# Patient Record
Sex: Female | Born: 1960 | Race: Black or African American | Hispanic: No | Smoking: Never smoker
Health system: Southern US, Community
[De-identification: ages and names within clinical notes are randomized; demographics above are authoritative.]

## PROBLEM LIST (undated history)

## (undated) DIAGNOSIS — D649 Anemia, unspecified: Secondary | ICD-10-CM

## (undated) DIAGNOSIS — I10 Essential (primary) hypertension: Secondary | ICD-10-CM

## (undated) DIAGNOSIS — R51 Headache: Secondary | ICD-10-CM

## (undated) DIAGNOSIS — Z90710 Acquired absence of both cervix and uterus: Secondary | ICD-10-CM

## (undated) DIAGNOSIS — Z9221 Personal history of antineoplastic chemotherapy: Secondary | ICD-10-CM

## (undated) DIAGNOSIS — K219 Gastro-esophageal reflux disease without esophagitis: Secondary | ICD-10-CM

## (undated) DIAGNOSIS — I639 Cerebral infarction, unspecified: Secondary | ICD-10-CM

## (undated) DIAGNOSIS — Z923 Personal history of irradiation: Secondary | ICD-10-CM

## (undated) DIAGNOSIS — Z8042 Family history of malignant neoplasm of prostate: Secondary | ICD-10-CM

## (undated) HISTORY — PX: SHOULDER ARTHROSCOPY: SHX128

## (undated) HISTORY — PX: KIDNEY STONE SURGERY: SHX686

## (undated) HISTORY — PX: LAPAROTOMY: SHX154

## (undated) HISTORY — DX: Family history of malignant neoplasm of prostate: Z80.42

## (undated) HISTORY — DX: Essential (primary) hypertension: I10

## (undated) SURGERY — ECHOCARDIOGRAM, TRANSESOPHAGEAL
Anesthesia: Moderate Sedation

---

## 1999-05-19 ENCOUNTER — Other Ambulatory Visit: Admission: RE | Admit: 1999-05-19 | Discharge: 1999-05-19 | Payer: Self-pay | Admitting: Obstetrics and Gynecology

## 2000-08-07 ENCOUNTER — Other Ambulatory Visit: Admission: RE | Admit: 2000-08-07 | Discharge: 2000-08-07 | Payer: Self-pay | Admitting: Obstetrics and Gynecology

## 2001-11-19 ENCOUNTER — Other Ambulatory Visit: Admission: RE | Admit: 2001-11-19 | Discharge: 2001-11-19 | Payer: Self-pay | Admitting: Obstetrics and Gynecology

## 2003-02-23 ENCOUNTER — Other Ambulatory Visit: Admission: RE | Admit: 2003-02-23 | Discharge: 2003-02-23 | Payer: Self-pay | Admitting: Obstetrics and Gynecology

## 2003-02-26 ENCOUNTER — Encounter: Admission: RE | Admit: 2003-02-26 | Discharge: 2003-02-26 | Payer: Self-pay | Admitting: Obstetrics and Gynecology

## 2004-03-13 ENCOUNTER — Encounter: Admission: RE | Admit: 2004-03-13 | Discharge: 2004-03-13 | Payer: Self-pay | Admitting: Obstetrics and Gynecology

## 2005-04-25 ENCOUNTER — Ambulatory Visit (HOSPITAL_COMMUNITY): Admission: RE | Admit: 2005-04-25 | Discharge: 2005-04-25 | Payer: Self-pay | Admitting: Family Medicine

## 2005-05-10 ENCOUNTER — Ambulatory Visit (HOSPITAL_COMMUNITY): Admission: RE | Admit: 2005-05-10 | Discharge: 2005-05-10 | Payer: Self-pay | Admitting: Urology

## 2005-05-28 ENCOUNTER — Encounter: Admission: RE | Admit: 2005-05-28 | Discharge: 2005-05-28 | Payer: Self-pay | Admitting: Obstetrics and Gynecology

## 2006-01-18 ENCOUNTER — Encounter (INDEPENDENT_AMBULATORY_CARE_PROVIDER_SITE_OTHER): Payer: Self-pay | Admitting: Specialist

## 2006-01-18 ENCOUNTER — Ambulatory Visit (HOSPITAL_COMMUNITY): Admission: RE | Admit: 2006-01-18 | Discharge: 2006-01-18 | Payer: Self-pay | Admitting: Obstetrics and Gynecology

## 2006-06-05 ENCOUNTER — Encounter: Admission: RE | Admit: 2006-06-05 | Discharge: 2006-06-05 | Payer: Self-pay | Admitting: Obstetrics and Gynecology

## 2007-06-10 ENCOUNTER — Encounter: Admission: RE | Admit: 2007-06-10 | Discharge: 2007-06-10 | Payer: Self-pay | Admitting: Obstetrics and Gynecology

## 2008-07-06 ENCOUNTER — Encounter: Admission: RE | Admit: 2008-07-06 | Discharge: 2008-07-06 | Payer: Self-pay | Admitting: Obstetrics and Gynecology

## 2009-07-19 ENCOUNTER — Encounter: Admission: RE | Admit: 2009-07-19 | Discharge: 2009-07-19 | Payer: Self-pay | Admitting: Obstetrics and Gynecology

## 2010-08-03 ENCOUNTER — Other Ambulatory Visit: Payer: Self-pay | Admitting: Obstetrics and Gynecology

## 2010-08-03 DIAGNOSIS — Z1231 Encounter for screening mammogram for malignant neoplasm of breast: Secondary | ICD-10-CM

## 2010-08-29 ENCOUNTER — Ambulatory Visit
Admission: RE | Admit: 2010-08-29 | Discharge: 2010-08-29 | Disposition: A | Discharge status: Home / Self Care | Payer: BC Managed Care – PPO | Source: Ambulatory Visit | Attending: Obstetrics and Gynecology | Admitting: Obstetrics and Gynecology

## 2010-08-29 DIAGNOSIS — Z1231 Encounter for screening mammogram for malignant neoplasm of breast: Secondary | ICD-10-CM

## 2010-09-15 NOTE — Op Note (Signed)
NAMEMILISSA, Katrina Liu               ACCOUNT NO.:  [id number]   MEDICAL RECORD NO.:  [id number]          PATIENT TYPE:  AMB   LOCATION:  SDC                           FACILITY:  [facility]   PHYSICIAN:  Maxie Better, M.D.DATE OF BIRTH:  [birth year]   DATE OF PROCEDURE:  01/18/2006  DATE OF DISCHARGE:  01/18/2006                                 OPERATIVE REPORT   PREOPERATIVE DIAGNOSES:  1. Moderate cervical dysplasia.  2. Shortened cervix.  3. Retroverted fibroid uterus   POSTOPERATIVE DIAGNOSES:  1. Moderate cervical dysplasia, pending final pathology.  2. Shortened cervix.  3. Retroverted Fibroid uterus.   PROCEDURE:  Colposcopic directed LEEP procedure.   ANESTHESIA:  General.   SURGEON:  Maxie Better, M.D.   ASSISTANT:  Pershing Cox, M.D.   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  The patient, however, had a significantly shortened cervix due to  her previous procedure.  The cervix was then further compromised by her  enlarged fibroid uterus, which was retroverted -- that pulled the cervix  anteriorly.  Decision was therefore made to perform her procedure under  operative setting.  The risks and benefits of the procedure had been  explained to the patient, including the possibility of not being able to  perform the procedure.  Consent was signed.  The patient was transferred to  the operating room.   PROCEDURE:  Under adequate monitored general anesthesia, the patient was  placed in the dorsal lithotomy position.  She was sterilely prepped and  draped in the usual fashion. The bladder was catheterized of a small amount  of urine.   Initially a bivalved speculum was placed in the vagina; however, the cervix  was very anterior and the bivalved speculum was then placed by a weighted  speculum.  The posterior lip of the cervix was much  more prominent than the  anterior lip.   Colposcopy was then performed.  Some aceto white abnormality was noted  posteriorly.  Then manipulation of the vaginal walls, particularly the side  walls, it was then felt that stay sutures at the 3 and 9 o'clock positions  would help to facilitate her surgery.  The cervix was also helped to be  visualized with Dr. Carey Bullocks pressing on the lower abdomen, which resulted in  displacing the uterus inferiorly and medially -- which would then allow for  the cervix to be better visualized.   Stay sutures of 0 Vicryl figure-of-eight was placed laterally.  The cervix  was then circumferentially injected with 1% Xylocaine with epinephrine, and  Lugol solution had been placed.  LEEP was then done with a 15 mm loop  posterior and anterior lip, with care not to go too deeply.  The base was  then fulgurated and Monsell solution applied.  The stay sutures were just  cut or would otherwise remain in place.  All instruments then were removed  from the vagina.   SPECIMENS:  Specimens of the anterior  and posterior lip of the cervix sent  to pathology.   ESTIMATED BLOOD LOSS:  Minimal.   COMPLICATIONS:  None.   DISPOSITION:  The patient tolerated the procedure well; was transferred to  recovery room in stable condition.      Maxie Better, M.D.  Electronically Signed     [place]/MEDQ  D:  01/18/2006  T:  01/21/2006  Job:  [id number]

## 2010-09-15 NOTE — Op Note (Signed)
NAMEANTHONY, ROLAND NO.:  [id number]   MEDICAL RECORD NO.:  [id number]          PATIENT TYPE:  INP   LOCATION:  1610                         FACILITY:  [facility]   PHYSICIAN:  Valetta Fuller, M.D.  DATE OF BIRTH:  [birth year]   DATE OF PROCEDURE:  04/25/2005  DATE OF DISCHARGE:                                 OPERATIVE REPORT   PREOPERATIVE DIAGNOSIS:  Right proximal ureteral calculus.   POSTOPERATIVE DIAGNOSIS:  Right proximal ureteral calculus.   PROCEDURE PERFORMED:  Cystoscopy, right retrograde pyelography and double-J  stent placement.   SURGEON:  Valetta Fuller, MD.   ANESTHESIA:  General.   INDICATIONS:  Ms. Jaquita Rector is a 50 year old female with no previous history  of nephrolithiasis. She presented approximately 12 hours ago with acute  onset of right flank pain. A CT scan was performed which showed an  obstructed right renal unit from a 5 mm proximal ureteral stone. The patient  had no evidence of infection but was having ongoing discomfort. We discussed  options with her. She understood there was approximately a 50/50 chance of  spontaneous passage. She preferred surgical intervention. Given the proximal  location of the stone, we felt the prudent thing to do this evening was just  to place a double-J stent and then either perform elective lithotripsy or  ureteroscopy at a later date. She appear to understand the advantages and  disadvantages of this approach.   TECHNIQUE AND FINDINGS:  The patient was brought to the operating room where  she had successful induction of general anesthesia. She was placed lithotomy  position, prepped and draped in the usual manner. Cystoscopy was performed  and was unremarkable. Attention was first towards retrograde pyelography.  This was done with an 8 [place] cone-tip catheter. The distal ureter was  normal caliber. In the proximal ureter  several centimeters beneath the  ureteropelvic junction, there was a  high-grade obstruction with a very  dilated proximal ureter and collecting system. There was a filling defect  suggesting a 8 mm stone.  Attention was then turned to stent placement. The guidewire was able to be  passed beyond the stone. We then used a 5-French Polaris stent which was 24  cm in length. This was placed with fluoroscopic as well as direct visual  guidance. The patient appeared to tolerate the procedure well. There were no  obvious complications and she was brought to the recovery room in stable  condition.           ______________________________  Valetta Fuller, M.D.  Electronically Signed     [facility]/MEDQ  D:  04/25/2005  T:  04/26/2005  Job:  [id number]

## 2011-04-06 ENCOUNTER — Encounter (HOSPITAL_COMMUNITY): Payer: Self-pay | Admitting: Pharmacist

## 2011-04-09 NOTE — Patient Instructions (Signed)
   Your procedure is scheduled on: Monday, Dec 17TH  Enter through the [employer] [facility] at: 11:30am Pick up the phone at the desk and dial [phone] and inform us of your arrival.  Please call this number if you have any problems the morning of surgery: [phone]  Remember: Do not eat food after midnight: Sunday Do not drink clear liquids after: 9am Monday Take these medicines the morning of surgery with a SIP OF WATER:  Do not wear jewelry, make-up, or FINGER nail polish Do not wear lotions, powders, perfumes or deodorant. Do not shave 48 hours prior to surgery. Do not bring valuables to the hospital.  Leave suitcase in the car. After Surgery it may be brought to your room. For patients being admitted to the hospital, checkout time is 11:00am the day of discharge.  Patients discharged on the day of surgery will not be allowed to drive home.     Remember to use your hibiclens as instructed.Please shower with 1/2 bottle the evening before your surgery and the other 1/2 bottle the morning of surgery.

## 2011-04-10 ENCOUNTER — Other Ambulatory Visit: Payer: Self-pay | Admitting: Obstetrics and Gynecology

## 2011-04-10 MED ORDER — ACETAMINOPHEN 10 MG/ML IV SOLN: 1000.0000 mg | Freq: Four times a day (QID) | INTRAVENOUS | Status: AC

## 2011-04-11 ENCOUNTER — Encounter (HOSPITAL_COMMUNITY)
Admission: RE | Admit: 2011-04-11 | Discharge: 2011-04-11 | Disposition: A | Discharge status: Home / Self Care | Payer: BC Managed Care – PPO | Source: Ambulatory Visit | Attending: Obstetrics and Gynecology | Admitting: Obstetrics and Gynecology

## 2011-04-11 ENCOUNTER — Encounter (HOSPITAL_COMMUNITY): Payer: Self-pay

## 2011-04-11 HISTORY — DX: Headache: R51

## 2011-04-11 HISTORY — DX: Gastro-esophageal reflux disease without esophagitis: K21.9

## 2011-04-11 HISTORY — DX: Anemia, unspecified: D64.9

## 2011-04-11 LAB — CBC
HCT: 39.5 % (ref 36.0–46.0)
Hemoglobin: 13.4 g/dL (ref 12.0–15.0)
MCH: 30.7 pg (ref 26.0–34.0)
MCHC: 33.9 g/dL (ref 30.0–36.0)
MCV: 90.6 fL (ref 78.0–100.0)
Platelets: 208 10*3/uL (ref 150–400)
RBC: 4.36 MIL/uL (ref 3.87–5.11)
RDW: 13.4 % (ref 11.5–15.5)
WBC: 4.4 10*3/uL (ref 4.0–10.5)

## 2011-04-11 LAB — BASIC METABOLIC PANEL
CO2: 23 mEq/L (ref 19–32)
Calcium: 9.5 mg/dL (ref 8.4–10.5)
Creatinine, Ser: 0.73 mg/dL (ref 0.50–1.10)
GFR calc non Af Amer: >90 mL/min (ref >90)
Glucose, Bld: 101 mg/dL — ABNORMAL HIGH (ref 70–99)

## 2011-04-11 LAB — SURGICAL PCR SCREEN
MRSA, PCR: NEGATIVE
Staphylococcus aureus: NEGATIVE

## 2011-04-15 MED ORDER — CEFAZOLIN SODIUM-DEXTROSE 2-3 GM-% IV SOLR
2.0000 g | INTRAVENOUS | Status: AC
  Administered 2011-04-16: 2 g via INTRAVENOUS
  Filled 2011-04-15: qty 50

## 2011-04-16 ENCOUNTER — Encounter (HOSPITAL_COMMUNITY): Payer: Self-pay | Admitting: *Deleted

## 2011-04-16 ENCOUNTER — Encounter (HOSPITAL_COMMUNITY): Payer: Self-pay | Admitting: Anesthesiology

## 2011-04-16 ENCOUNTER — Encounter (HOSPITAL_COMMUNITY)
Admission: RE | Disposition: A | Discharge status: Home / Self Care | Payer: Self-pay | Source: Ambulatory Visit | Attending: Obstetrics and Gynecology

## 2011-04-16 ENCOUNTER — Other Ambulatory Visit: Payer: Self-pay | Admitting: Obstetrics and Gynecology

## 2011-04-16 ENCOUNTER — Inpatient Hospital Stay (HOSPITAL_COMMUNITY)
Admission: RE | Admit: 2011-04-16 | Discharge: 2011-04-18 | DRG: 359 | Disposition: A | Discharge status: Home / Self Care | Payer: BC Managed Care – PPO | Source: Ambulatory Visit | Attending: Obstetrics and Gynecology | Admitting: Obstetrics and Gynecology

## 2011-04-16 ENCOUNTER — Ambulatory Visit (HOSPITAL_COMMUNITY): Payer: BC Managed Care – PPO | Admitting: Anesthesiology

## 2011-04-16 DIAGNOSIS — D250 Submucous leiomyoma of uterus: Secondary | ICD-10-CM | POA: Diagnosis present

## 2011-04-16 DIAGNOSIS — N920 Excessive and frequent menstruation with regular cycle: Principal | ICD-10-CM | POA: Diagnosis present

## 2011-04-16 DIAGNOSIS — D252 Subserosal leiomyoma of uterus: Secondary | ICD-10-CM | POA: Diagnosis present

## 2011-04-16 DIAGNOSIS — N949 Unspecified condition associated with female genital organs and menstrual cycle: Secondary | ICD-10-CM | POA: Diagnosis present

## 2011-04-16 DIAGNOSIS — Z90710 Acquired absence of both cervix and uterus: Secondary | ICD-10-CM | POA: Diagnosis not present

## 2011-04-16 DIAGNOSIS — Z5331 Laparoscopic surgical procedure converted to open procedure: Secondary | ICD-10-CM

## 2011-04-16 DIAGNOSIS — N946 Dysmenorrhea, unspecified: Secondary | ICD-10-CM | POA: Diagnosis present

## 2011-04-16 DIAGNOSIS — D251 Intramural leiomyoma of uterus: Secondary | ICD-10-CM | POA: Diagnosis present

## 2011-04-16 DIAGNOSIS — D649 Anemia, unspecified: Secondary | ICD-10-CM | POA: Diagnosis present

## 2011-04-16 DIAGNOSIS — N8000 Endometriosis of the uterus, unspecified: Secondary | ICD-10-CM | POA: Diagnosis present

## 2011-04-16 HISTORY — DX: Acquired absence of both cervix and uterus: Z90.710

## 2011-04-16 HISTORY — PX: ABDOMINAL HYSTERECTOMY: SHX81

## 2011-04-16 SURGERY — SALPINGECTOMY, BILATERAL, OPEN
Anesthesia: General | Site: Uterus | Wound class: Clean Contaminated

## 2011-04-16 MED ORDER — NEOSTIGMINE METHYLSULFATE 1 MG/ML IJ SOLN
INTRAMUSCULAR | Status: DC | PRN
  Administered 2011-04-16: 2 mg via INTRAVENOUS

## 2011-04-16 MED ORDER — ONDANSETRON HCL 4 MG/2ML IJ SOLN: 4.0000 mg | Freq: Four times a day (QID) | INTRAMUSCULAR | Status: DC | PRN

## 2011-04-16 MED ORDER — LACTATED RINGERS IR SOLN
Status: DC | PRN
  Administered 2011-04-16: 3000 mL

## 2011-04-16 MED ORDER — DEXAMETHASONE SODIUM PHOSPHATE 10 MG/ML IJ SOLN
INTRAMUSCULAR | Status: AC
  Filled 2011-04-16: qty 1

## 2011-04-16 MED ORDER — HYDROMORPHONE 0.3 MG/ML IV SOLN
INTRAVENOUS | Status: DC
  Administered 2011-04-16: 0.6 mg via INTRAVENOUS
  Administered 2011-04-16: 20:00:00 via INTRAVENOUS
  Administered 2011-04-17 (×2): 0.3 mg via INTRAVENOUS
  Filled 2011-04-16: qty 25

## 2011-04-16 MED ORDER — ROCURONIUM BROMIDE 50 MG/5ML IV SOLN
INTRAVENOUS | Status: AC
  Filled 2011-04-16: qty 1

## 2011-04-16 MED ORDER — FENTANYL CITRATE 0.05 MG/ML IJ SOLN
INTRAMUSCULAR | Status: AC
  Administered 2011-04-16: 50 ug via INTRAVENOUS
  Filled 2011-04-16: qty 2

## 2011-04-16 MED ORDER — FENTANYL CITRATE 0.05 MG/ML IJ SOLN
INTRAMUSCULAR | Status: AC
  Filled 2011-04-16: qty 2

## 2011-04-16 MED ORDER — OXYCODONE-ACETAMINOPHEN 5-325 MG PO TABS
1.0000 | ORAL_TABLET | ORAL | Status: DC | PRN
  Administered 2011-04-17 – 2011-04-18 (×3): 1 via ORAL
  Filled 2011-04-16 (×3): qty 1

## 2011-04-16 MED ORDER — SUFENTANIL CITRATE 50 MCG/ML IV SOLN
INTRAVENOUS | Status: AC
  Filled 2011-04-16: qty 1

## 2011-04-16 MED ORDER — PROPOFOL 10 MG/ML IV EMUL
INTRAVENOUS | Status: DC | PRN
  Administered 2011-04-16: 150 mg via INTRAVENOUS
  Administered 2011-04-16 (×2): 20 mg via INTRAVENOUS
  Administered 2011-04-16 (×2): 50 mg via INTRAVENOUS
  Administered 2011-04-16: 20 mg via INTRAVENOUS

## 2011-04-16 MED ORDER — DIPHENHYDRAMINE HCL 50 MG/ML IJ SOLN: 12.5000 mg | Freq: Four times a day (QID) | INTRAMUSCULAR | Status: DC | PRN

## 2011-04-16 MED ORDER — GLYCOPYRROLATE 0.2 MG/ML IJ SOLN
INTRAMUSCULAR | Status: DC | PRN
  Administered 2011-04-16: .4 mg via INTRAVENOUS

## 2011-04-16 MED ORDER — HYDROMORPHONE HCL PF 1 MG/ML IJ SOLN
0.2000 mg | INTRAMUSCULAR | Status: DC | PRN
Start: 2011-04-16 — End: 2011-04-18

## 2011-04-16 MED ORDER — ARTIFICIAL TEARS OP OINT
TOPICAL_OINTMENT | OPHTHALMIC | Status: DC | PRN
  Administered 2011-04-16: 1 via OPHTHALMIC

## 2011-04-16 MED ORDER — ONDANSETRON HCL 4 MG/2ML IJ SOLN
INTRAMUSCULAR | Status: AC
  Filled 2011-04-16: qty 2

## 2011-04-16 MED ORDER — ONDANSETRON HCL 4 MG PO TABS: 4.0000 mg | ORAL_TABLET | Freq: Four times a day (QID) | ORAL | Status: DC | PRN

## 2011-04-16 MED ORDER — KETOROLAC TROMETHAMINE 30 MG/ML IJ SOLN: 30.0000 mg | Freq: Four times a day (QID) | INTRAMUSCULAR | Status: DC

## 2011-04-16 MED ORDER — LIDOCAINE HCL (CARDIAC) 20 MG/ML IV SOLN
INTRAVENOUS | Status: AC
  Filled 2011-04-16: qty 5

## 2011-04-16 MED ORDER — ACETAMINOPHEN 10 MG/ML IV SOLN
1000.0000 mg | Freq: Once | INTRAVENOUS | Status: AC
  Administered 2011-04-16: 1000 mg via INTRAVENOUS
  Filled 2011-04-16: qty 100

## 2011-04-16 MED ORDER — PANTOPRAZOLE SODIUM 40 MG PO TBEC
40.0000 mg | DELAYED_RELEASE_TABLET | Freq: Every day | ORAL | Status: DC
  Administered 2011-04-16 – 2011-04-17 (×2): 40 mg via ORAL
  Filled 2011-04-16 (×3): qty 1

## 2011-04-16 MED ORDER — MENTHOL 3 MG MT LOZG: 1.0000 | LOZENGE | OROMUCOSAL | Status: DC | PRN

## 2011-04-16 MED ORDER — HETASTARCH-ELECTROLYTES 6 % IV SOLN
INTRAVENOUS | Status: DC | PRN
  Administered 2011-04-16: 15:00:00 via INTRAVENOUS

## 2011-04-16 MED ORDER — CEFAZOLIN SODIUM 1-5 GM-% IV SOLN
1.0000 g | Freq: Three times a day (TID) | INTRAVENOUS | Status: AC
Start: 2011-04-16 — End: 2011-04-17
  Administered 2011-04-16 – 2011-04-17 (×2): 1 g via INTRAVENOUS
  Filled 2011-04-16 (×2): qty 50

## 2011-04-16 MED ORDER — DIPHENHYDRAMINE HCL 12.5 MG/5ML PO ELIX: 12.5000 mg | ORAL_SOLUTION | Freq: Four times a day (QID) | ORAL | Status: DC | PRN

## 2011-04-16 MED ORDER — HYDROMORPHONE HCL PF 1 MG/ML IJ SOLN
0.5000 mg | INTRAMUSCULAR | Status: AC | PRN
  Administered 2011-04-16 (×4): 0.5 mg via INTRAVENOUS

## 2011-04-16 MED ORDER — NALOXONE HCL 0.4 MG/ML IJ SOLN: 0.4000 mg | INTRAMUSCULAR | Status: DC | PRN

## 2011-04-16 MED ORDER — SODIUM CHLORIDE 0.9 % IJ SOLN: 9.0000 mL | INTRAMUSCULAR | Status: DC | PRN

## 2011-04-16 MED ORDER — ZOLPIDEM TARTRATE 5 MG PO TABS: 5.0000 mg | ORAL_TABLET | Freq: Every evening | ORAL | Status: DC | PRN

## 2011-04-16 MED ORDER — DEXTROSE IN LACTATED RINGERS 5 % IV SOLN
INTRAVENOUS | Status: DC
  Administered 2011-04-16 – 2011-04-17 (×2): via INTRAVENOUS

## 2011-04-16 MED ORDER — HETASTARCH-ELECTROLYTES 6 % IV SOLN
INTRAVENOUS | Status: AC
  Filled 2011-04-16: qty 500

## 2011-04-16 MED ORDER — ROCURONIUM BROMIDE 100 MG/10ML IV SOLN
INTRAVENOUS | Status: DC | PRN
  Administered 2011-04-16: 10 mg via INTRAVENOUS
  Administered 2011-04-16: 50 mg via INTRAVENOUS
  Administered 2011-04-16: 10 mg via INTRAVENOUS

## 2011-04-16 MED ORDER — HYDROMORPHONE HCL PF 1 MG/ML IJ SOLN
INTRAMUSCULAR | Status: AC
  Administered 2011-04-16: 0.5 mg via INTRAVENOUS
  Filled 2011-04-16: qty 1

## 2011-04-16 MED ORDER — IBUPROFEN 800 MG PO TABS: 800.0000 mg | ORAL_TABLET | Freq: Three times a day (TID) | ORAL | Status: DC | PRN

## 2011-04-16 MED ORDER — FENTANYL CITRATE 0.05 MG/ML IJ SOLN
25.0000 ug | INTRAMUSCULAR | Status: DC | PRN
  Administered 2011-04-16 (×3): 50 ug via INTRAVENOUS

## 2011-04-16 MED ORDER — KETOROLAC TROMETHAMINE 30 MG/ML IJ SOLN
INTRAMUSCULAR | Status: AC
  Filled 2011-04-16: qty 1

## 2011-04-16 MED ORDER — LACTATED RINGERS IV SOLN
INTRAVENOUS | Status: DC
Start: 2011-04-16 — End: 2011-04-16
  Administered 2011-04-16 (×3): via INTRAVENOUS

## 2011-04-16 MED ORDER — SUFENTANIL CITRATE 50 MCG/ML IV SOLN
INTRAVENOUS | Status: DC | PRN
  Administered 2011-04-16: 10 ug via INTRAVENOUS
  Administered 2011-04-16: 15 ug via INTRAVENOUS
  Administered 2011-04-16: 10 ug via INTRAVENOUS
  Administered 2011-04-16: 5 ug via INTRAVENOUS
  Administered 2011-04-16: 10 ug via INTRAVENOUS

## 2011-04-16 MED ORDER — KETOROLAC TROMETHAMINE 30 MG/ML IJ SOLN
30.0000 mg | Freq: Four times a day (QID) | INTRAMUSCULAR | Status: DC
  Administered 2011-04-16 – 2011-04-18 (×6): 30 mg via INTRAVENOUS
  Filled 2011-04-16 (×6): qty 1

## 2011-04-16 MED ORDER — DEXAMETHASONE SODIUM PHOSPHATE 10 MG/ML IJ SOLN
INTRAMUSCULAR | Status: DC | PRN
  Administered 2011-04-16: 10 mg via INTRAVENOUS

## 2011-04-16 MED ORDER — MIDAZOLAM HCL 5 MG/5ML IJ SOLN
INTRAMUSCULAR | Status: DC | PRN
  Administered 2011-04-16 (×2): 1 mg via INTRAVENOUS

## 2011-04-16 MED ORDER — GLYCOPYRROLATE 0.2 MG/ML IJ SOLN
INTRAMUSCULAR | Status: AC
  Filled 2011-04-16: qty 2

## 2011-04-16 MED ORDER — KETOROLAC TROMETHAMINE 30 MG/ML IJ SOLN
INTRAMUSCULAR | Status: DC | PRN
  Administered 2011-04-16: 30 mg via INTRAVENOUS

## 2011-04-16 MED ORDER — BUPIVACAINE HCL (PF) 0.25 % IJ SOLN
INTRAMUSCULAR | Status: DC | PRN
  Administered 2011-04-16: 15 mL

## 2011-04-16 MED ORDER — ONDANSETRON HCL 4 MG/2ML IJ SOLN
INTRAMUSCULAR | Status: DC | PRN
  Administered 2011-04-16: 4 mg via INTRAVENOUS

## 2011-04-16 MED ORDER — LIDOCAINE HCL (CARDIAC) 20 MG/ML IV SOLN
INTRAVENOUS | Status: DC | PRN
  Administered 2011-04-16: 60 mg via INTRAVENOUS

## 2011-04-16 MED ORDER — NEOSTIGMINE METHYLSULFATE 1 MG/ML IJ SOLN
INTRAMUSCULAR | Status: AC
  Filled 2011-04-16: qty 10

## 2011-04-16 MED ORDER — PROPOFOL 10 MG/ML IV EMUL
INTRAVENOUS | Status: AC
  Filled 2011-04-16: qty 40

## 2011-04-16 MED ORDER — MIDAZOLAM HCL 2 MG/2ML IJ SOLN
INTRAMUSCULAR | Status: AC
  Filled 2011-04-16: qty 2

## 2011-04-16 MED ORDER — HYDROMORPHONE HCL PF 1 MG/ML IJ SOLN
INTRAMUSCULAR | Status: AC
  Filled 2011-04-16: qty 1

## 2011-04-16 SURGICAL SUPPLY — 71 items
BAG URINE DRAINAGE (UROLOGICAL SUPPLIES) ×3 IMPLANT
BARRIER ADHS 3X4 INTERCEED (GAUZE/BANDAGES/DRESSINGS) IMPLANT
BENZOIN TINCTURE PRP APPL 2/3 (GAUZE/BANDAGES/DRESSINGS) ×3 IMPLANT
BLADE SURG 10 STRL SS (BLADE) ×6 IMPLANT
CABLE HIGH FREQUENCY MONO STRZ (ELECTRODE) ×3 IMPLANT
CANISTER SUCTION 2500CC (MISCELLANEOUS) ×3 IMPLANT
CATH FOLEY 3WAY  5CC 16FR (CATHETERS) ×1
CATH FOLEY 3WAY 5CC 16FR (CATHETERS) ×2 IMPLANT
CHLORAPREP W/TINT 26ML (MISCELLANEOUS) ×3 IMPLANT
CLOTH BEACON ORANGE TIMEOUT ST (SAFETY) ×3 IMPLANT
CONT PATH 16OZ SNAP LID 3702 (MISCELLANEOUS) ×3 IMPLANT
COVER MAYO STAND STRL (DRAPES) ×3 IMPLANT
COVER TABLE BACK 60X90 (DRAPES) ×6 IMPLANT
COVER TIP SHEARS 8 DVNC (MISCELLANEOUS) ×2 IMPLANT
COVER TIP SHEARS 8MM DA VINCI (MISCELLANEOUS) ×1
DECANTER SPIKE VIAL GLASS SM (MISCELLANEOUS) ×3 IMPLANT
DERMABOND ADVANCED (GAUZE/BANDAGES/DRESSINGS) ×1
DERMABOND ADVANCED .7 DNX12 (GAUZE/BANDAGES/DRESSINGS) ×2 IMPLANT
DRAPE HUG U DISPOSABLE (DRAPE) ×3 IMPLANT
DRAPE LG THREE QUARTER DISP (DRAPES) ×6 IMPLANT
DRAPE WARM FLUID 44X44 (DRAPE) ×3 IMPLANT
DRESSING TELFA 8X3 (GAUZE/BANDAGES/DRESSINGS) ×3 IMPLANT
DRSG COVADERM 4X10 (GAUZE/BANDAGES/DRESSINGS) ×3 IMPLANT
ELECT LIGASURE LONG (ELECTRODE) ×3 IMPLANT
ELECT REM PT RETURN 9FT ADLT (ELECTROSURGICAL) ×3
ELECTRODE REM PT RTRN 9FT ADLT (ELECTROSURGICAL) ×2 IMPLANT
EVACUATOR SMOKE 8.L (FILTER) ×3 IMPLANT
GAUZE VASELINE 3X9 (GAUZE/BANDAGES/DRESSINGS) IMPLANT
GLOVE BIO SURGEON STRL SZ 6.5 (GLOVE) ×9 IMPLANT
GLOVE BIOGEL PI IND STRL 7.0 (GLOVE) ×12 IMPLANT
GLOVE BIOGEL PI INDICATOR 7.0 (GLOVE) ×6
GOWN PREVENTION PLUS LG XLONG (DISPOSABLE) ×6 IMPLANT
KIT ACCESSORY DA VINCI DISP (KITS) ×1
KIT ACCESSORY DVNC DISP (KITS) ×2 IMPLANT
NEEDLE HYPO 22GX1.5 SAFETY (NEEDLE) ×3 IMPLANT
NEEDLE HYPO 25X1 1.5 SAFETY (NEEDLE) ×3 IMPLANT
NEEDLE INSUFFLATION 14GA 120MM (NEEDLE) ×3 IMPLANT
NS IRRIG 1000ML POUR BTL (IV SOLUTION) ×9 IMPLANT
OCCLUDER COLPOPNEUMO (BALLOONS) ×3 IMPLANT
PACK LAVH (CUSTOM PROCEDURE TRAY) ×3 IMPLANT
PAD OB MATERNITY 4.3X12.25 (PERSONAL CARE ITEMS) ×3 IMPLANT
PAD PREP 24X48 CUFFED NSTRL (MISCELLANEOUS) ×6 IMPLANT
PLUG CATH AND CAP STER (CATHETERS) ×3 IMPLANT
SET IRRIG TUBING LAPAROSCOPIC (IRRIGATION / IRRIGATOR) ×3 IMPLANT
SPONGE LAP 18X18 X RAY DECT (DISPOSABLE) ×15 IMPLANT
STRIP CLOSURE SKIN 1/2X4 (GAUZE/BANDAGES/DRESSINGS) ×3 IMPLANT
SUT MON AB 4-0 PS1 27 (SUTURE) ×6 IMPLANT
SUT PLAIN 2 0 (SUTURE) ×1
SUT PLAIN ABS 2-0 CT1 27XMFL (SUTURE) ×2 IMPLANT
SUT VIC AB 0 CT1 18XCR BRD8 (SUTURE) ×4 IMPLANT
SUT VIC AB 0 CT1 27 (SUTURE) ×5
SUT VIC AB 0 CT1 27XBRD ANBCTR (SUTURE) ×4 IMPLANT
SUT VIC AB 0 CT1 27XBRD ANTBC (SUTURE) ×6 IMPLANT
SUT VIC AB 0 CT1 36 (SUTURE) ×6 IMPLANT
SUT VIC AB 0 CT1 8-18 (SUTURE) ×2
SUT VICRYL 0 TIES 12 18 (SUTURE) ×3 IMPLANT
SUT VICRYL 0 UR6 27IN ABS (SUTURE) ×6 IMPLANT
SUT VICRYL 4-0 PS2 18IN ABS (SUTURE) ×6 IMPLANT
SYR 50ML LL SCALE MARK (SYRINGE) ×3 IMPLANT
SYSTEM CONVERTIBLE TROCAR (TROCAR) ×3 IMPLANT
TIP UTERINE 6.7X10CM GRN DISP (MISCELLANEOUS) ×3 IMPLANT
TOWEL OR 17X24 6PK STRL BLUE (TOWEL DISPOSABLE) ×9 IMPLANT
TRAY FOLEY CATH 14FR (SET/KITS/TRAYS/PACK) ×3 IMPLANT
TROCAR 12M 150ML BLUNT (TROCAR) ×3 IMPLANT
TROCAR DISP BLADELESS 8 DVNC (TROCAR) ×2 IMPLANT
TROCAR DISP BLADELESS 8MM (TROCAR) ×1
TROCAR Z-THREAD 12X150 (TROCAR) ×3 IMPLANT
TUBING CONNECTING 10 (TUBING) ×3 IMPLANT
TUBING FILTER THERMOFLATOR (ELECTROSURGICAL) ×3 IMPLANT
WARMER LAPAROSCOPE (MISCELLANEOUS) ×3 IMPLANT
WATER STERILE IRR 1000ML POUR (IV SOLUTION) ×3 IMPLANT

## 2011-04-16 NOTE — Anesthesia Preprocedure Evaluation (Signed)
Anesthesia Evaluation  Patient identified by MRN, date of birth, ID band Patient awake    Reviewed: Allergy & Precautions, H&P , NPO status , Patient's Chart, lab work & pertinent test results, reviewed documented beta blocker date and time   History of Anesthesia Complications Negative for: history of anesthetic complications  Airway Mallampati: II      Dental  (+) Teeth Intact   Pulmonary neg pulmonary ROS,  clear to auscultation  Pulmonary exam normal       Cardiovascular Exercise Tolerance: Good hypertension (undiagnosed.  BP 179/97 today.  Was on atenolol 10 years ago for HTN, but BP came down and Dr discontinued meds), regular Normal    Neuro/Psych  Headaches (migraines (menstrual timing) - has a migraine now ), Negative Psych ROS   GI/Hepatic negative GI ROS, Neg liver ROS,   Endo/Other  Negative Endocrine ROS  Renal/GU negative Renal ROS  Genitourinary negative   Musculoskeletal   Abdominal   Peds  Hematology negative hematology ROS (+) Blood dyscrasia (takes iron pills), anemia ,   Anesthesia Other Findings   Reproductive/Obstetrics negative OB ROS                           Anesthesia Physical Anesthesia Plan  ASA: II  Anesthesia Plan: General ETT   Post-op Pain Management:    Induction:   Airway Management Planned:   Additional Equipment:   Intra-op Plan:   Post-operative Plan:   Informed Consent: I have reviewed the patients History and Physical, chart, labs and discussed the procedure including the risks, benefits and alternatives for the proposed anesthesia with the patient or authorized representative who has indicated his/her understanding and acceptance.   Dental Advisory Given  Plan Discussed with: CRNA and Surgeon  Anesthesia Plan Comments:         Anesthesia Quick Evaluation

## 2011-04-16 NOTE — Anesthesia Postprocedure Evaluation (Signed)
  Anesthesia Post-op Note  Patient: Katrina Liu  Procedure(s) Performed:  BILATERAL SALPINGECTOMY; HYSTERECTOMY ABDOMINAL; ROBOTIC ASSISTED TOTAL HYSTERECTOMY - Attempted Robotic Hysterectomy; Converted to Abdominal Hysterectomy  Patient is awake and responsive. Pain and nausea are reasonably well controlled. Vital signs are stable and clinically acceptable. Oxygen saturation is clinically acceptable. There are no apparent anesthetic complications at this time. Patient is ready for discharge.

## 2011-04-16 NOTE — Transfer of Care (Signed)
Immediate Anesthesia Transfer of Care Note  Patient: Katrina Liu  Procedure(s) Performed:  BILATERAL SALPINGECTOMY; HYSTERECTOMY ABDOMINAL; ROBOTIC ASSISTED TOTAL HYSTERECTOMY - Attempted Robotic Hysterectomy; Converted to Abdominal Hysterectomy  Patient Location: PACU  Anesthesia Type: General  Level of Consciousness: alert  and oriented  Airway & Oxygen Therapy: Patient Spontanous Breathing and Patient connected to nasal cannula oxygen  Post-op Assessment: Report given to PACU RN and Post -op Vital signs reviewed and stable  Post vital signs: stable  Complications: No apparent anesthesia complications

## 2011-04-16 NOTE — Brief Op Note (Signed)
04/16/2011  4:53 PM  PATIENT:  Katrina Liu  50 y.o. female  PRE-OPERATIVE DIAGNOSIS:  Menorrhagia, Uterine Fibroids, Anemia, pelvic pain/dysmenorrhea  POST-OPERATIVE DIAGNOSIS:  Menorrhagia, Uterine Fibroids, Anemia, pelvic pain/dysmenorrhea  PROCEDURE:  Procedure(s): EXPLORATORY LAPAROTOMY, BILATERAL SALPINGECTOMY HYSTERECTOMY ABDOMINAL( TOTAL) ATTEMPTED ROBOTIC ASSISTED TOTAL HYSTERECTOMY  SURGEON:  Surgeon(s): Liviah Cake Cathie Beams, MD Lenoard Aden, MD  PHYSICIAN ASSISTANT:   ASSISTANTS: Greig Right   ANESTHESIA:   general  FINDINGS: MULTIPLE UTERINE FIBROID, UPPER ABD ADHESIONS, NL TUBES AND OVARIES, LARGE POST CUL DE SAC FIBROID, SHORTENED CERVIX, RIGHT URETER SEEN  EBL:  Total I/O In: 2300 [I.V.:1800; IV Piggyback:500] Out: 800 [Urine:450; Blood:350]  BLOOD ADMINISTERED:none  DRAINS: none   LOCAL MEDICATIONS USED:  MARCAINE 7CC   SPECIMEN:  Source of Specimen:  uterus with cervix, tubes  DISPOSITION OF SPECIMEN:  PATHOLOGY  COUNTS:  YES  TOURNIQUET:  * No tourniquets in log *  DICTATION: .Other Dictation: Dictation Number   PLAN OF CARE: Admit to inpatient   PATIENT DISPOSITION:  PACU - hemodynamically stable.   Delay start of Pharmacological VTE agent (>24hrs) due to surgical blood loss or risk of bleeding: NO

## 2011-04-17 ENCOUNTER — Encounter (HOSPITAL_COMMUNITY): Payer: Self-pay

## 2011-04-17 DIAGNOSIS — Z90710 Acquired absence of both cervix and uterus: Secondary | ICD-10-CM

## 2011-04-17 HISTORY — DX: Acquired absence of both cervix and uterus: Z90.710

## 2011-04-17 LAB — BASIC METABOLIC PANEL
Calcium: 8.3 mg/dL — ABNORMAL LOW (ref 8.4–10.5)
GFR calc Af Amer: >90 mL/min (ref >90)
GFR calc non Af Amer: >90 mL/min (ref >90)
Glucose, Bld: 125 mg/dL — ABNORMAL HIGH (ref 70–99)
Potassium: 3.8 mEq/L (ref 3.5–5.1)
Sodium: 133 mEq/L — ABNORMAL LOW (ref 135–145)

## 2011-04-17 LAB — CBC
MCH: 30.4 pg (ref 26.0–34.0)
MCHC: 33.9 g/dL (ref 30.0–36.0)
RDW: 13.1 % (ref 11.5–15.5)

## 2011-04-17 NOTE — Addendum Note (Signed)
Addendum  created 04/17/11 0745 by Cephus Shelling   Modules edited:Notes Section

## 2011-04-17 NOTE — Progress Notes (Signed)
UR Chart review completed.  

## 2011-04-17 NOTE — Anesthesia Postprocedure Evaluation (Signed)
  Anesthesia Post-op Note  Patient: Katrina Liu  Procedure(s) Performed:  BILATERAL SALPINGECTOMY; HYSTERECTOMY ABDOMINAL; ROBOTIC ASSISTED TOTAL HYSTERECTOMY - Attempted Robotic Hysterectomy; Converted to Abdominal Hysterectomy  Patient Location: [facility]  Anesthesia Type: General  Level of Consciousness: awake  Airway and Oxygen Therapy: Patient Spontanous Breathing  Post-op Pain: mild  Post-op Assessment: Post-op Vital signs reviewed  Post-op Vital Signs: Reviewed and stable  Complications: No apparent anesthesia complications

## 2011-04-17 NOTE — Op Note (Signed)
Katrina Liu, Katrina Liu               ACCOUNT NO.:  [id number]  MEDICAL RECORD NO.:  [id number]  LOCATION:  9319                          FACILITY:  [facility]  PHYSICIAN:  Maxie Better, M.D.DATE OF BIRTH:  08-Aug-1960  DATE OF PROCEDURE:  04/16/2011 DATE OF DISCHARGE:                              OPERATIVE REPORT   PREOPERATIVE DIAGNOSES:  Pelvic pain, dysmenorrhea, menorrhagia, uterine fibroids.  PROCEDURES:  Attempted da Vinci robotic total hysterectomy, exploratory laparotomy, total abdominal hysterectomy, and bilateral salpingectomy.  POSTOPERATIVE DIAGNOSES:  Fibroid uterus, pelvic pain, and menorrhagia.  ANESTHESIA:  General.  SURGEON:  Maxie Better, MD  ASSISTANTS: 1. Arlan Organ, CNM 2. Lenoard Aden, MD  PROCEDURE:  Under adequate general anesthesia, the patient was placed in the dorsal lithotomy position and positioned for robotic surgery. Examination under anesthesia revealed an irregular about 16-18 week size uterus.  No adnexal masses could be assessed due to the fibroids.  The patient had a very short cervix due to prior LEEP procedure that was noted underneath the symphysis pubis.  The patient was sterilely prepped and draped.  Indwelling Foley catheter was sterilely placed.  Weighted speculum was placed in the vagina.  Sims retractor was used anteriorly. The cervix was grasped with a single-tooth tenaculum that was present and anterior and what appears to be the posterior cervix.  The uterus sounded to 10 cm, easily accepted a #29 Pratt dilator.  The posterior cul-de-sac had a palpable mass where there was thought to be fibroid posteriorly.  The medium-sized RUMI Koh ring along with a #10 uterine manipulator was inserted without incident.  The figure-of-eight sutures on the anterior posterior lip of the cervix helped to pull that area into the ring.  Once that was done, the balloon insufflated in the uterus.  Attention was then turned to the abdomen.   Supraumbilical vertical incision was made at least about 4 fingerbreadths above the umbilicus.  An incision was made after 0.25% Marcaine was injected, carried down to the rectus fascia.  Rectus fascia was opened transversely and purse-string sutures were placed on the rectus fascia and the parietal peritoneum entered bluntly.  The Hasson cannula was introduced into the abdomen without incident.  The robotic camera was then inserted.  Inspection of the pelvis noted for right upper quadrant scar tissue distal to the liver which appeared otherwise normal.  The uterus which had multiple fibroids and right tube and ovary could be seen.  The left could not initially be seen immediately.  The pelvis was otherwise unremarkable.  The patient was placed in the deep Trendelenburg and the additional robotic ports sites were then placed. The assistant port was a 12 mm, was placed on the left lower quadrant. In between that was an 8-mm port site and then two 8-mm sites on the right.  The robotic instruments were then placed under direct visualization.  The robot was docked to the patient's right side and a single-tooth tenaculum robotic grasper was placed on the right along with the scissors and the PK on the left.  I then went to the surgical console.  The patient had an indwelling Foley catheter placement during this process.  Inspection was notable for  the ureter being seen on the left.  Multiple fibroids including a fibroid of the subserosal posteriorly.  Once the ureter on the right was identified, the round ligament, the utero-ovarian ligament on the right were clamped, cauterized, and cut, carried down to as far as that could be seen with the bladder reflection.  A 30-degree angle scope was then used to help to go over the uterus and part of the bladder peritoneum was then opened on the right.  It then noted that there was a fibroid in the posterior cul-de-sac deep in the pelvis.  On the left,  the left tube could be seen.  The left ovary was brought up.  There was evidence and tried to push the uterus to the right.  There was limited mobility anteriorly and posteriorly due to the fibroid.  Nonetheless attempt at bringing up the deep to assess the left side was notable for a fibroid being seen at which time single-tooth tenaculum was then utilized through the assistant port to bring that fibroid up.  Increased bleeding was noted particularly on that side.  Limited ability to visualize and to perform continuous surgery on that side.  Suction was done.  Attempt to try to immobilize the uterus was limited again.  Decision was therefore made due to inability to assess the left side to convert the procedure to an open procedure.  At that point, the ports remained and the instruments from the robot was removed.  The robot was undocked.  The patient had a previous Pfannenstiel skin incision.  0.25% Marcaine was injected to the Pfannenstiel skin incision site.  Pfannenstiel skin incision was then made, carried down to the rectus fascia.  Rectus fascia was opened transversely.  The rectus fascia was then bluntly and sharply dissected off the rectus muscle in superior and inferior fashion.  The rectus muscles split in the midline.  The parietal peritoneum was entered sharply and extended.  Blood in the abdomen was noted which was suctioned.  The uterus was then exteriorized with the finding of the fibroid in the posterior cul-de-sac to be actually pedunculated and had incarcerated itself in the cul-de-sac.  One side was up and the uterus was exteriorized.  It was noted that the right side of the robotic surgery carried down to the level of the uterine vessels and just below on the left.  The LigaSure was then used to cauterize the left round ligament.  The left uteroovarian ligament serially clamped, cauterized, and cut and carried down to the uterine vessels.  The uterine vessels were  then bilaterally clamped, cauterized, and cut.  The cardinal ligaments were then serially clamped, cauterized, and cut and carried down to the level of the uterosacral ligaments which were then bilaterally clamped, cut, and suture ligated with 0 Vicryl. The bladder was further displaced inferiorly.  A small incision was made where the cervix should be due to the shortness of the cervix.  It was then decided to open that in that manner.  The sutures that had been placed which had been cut vaginally were then identified and a circumferential incision was then made to remove the cervix from its vaginal attachment.  The angle sutures were placed with 0 Vicryl and then the vaginal cuff was reefed with 0 Vicryl circumferentially and the uterosacral ligaments and the vaginal angles were tied together.  The vaginal cuff was closed in a vertical fashion with interrupted figure-of- eight sutures.  Bleeding that was noted on both angle sites required  additional sutures with good hemostasis noted.  The abdomen was then copiously irrigated and suctioned.  The port sites were then removed and continuous suction irrigation was then performed in order to make sure that good hemostasis noted.  The purse-string suture remained at the umbilicus.  Once good hemostasis was achieved throughout, the right fallopian tube which had initially not been removed was removed.  The ovaries on both sides were normal size.  The parietal peritoneum was then closed with 2-0 Vicryl.  The rectus fascia was closed with 0 Vicryl x2.  The subcutaneous area was irrigated.  Small bleeders were cauterized.  Interrupted 2-0 plain sutures were placed.  The skin approximated with 4-0 Monocryl suture in a subcuticular fashion.  The purse-string stitch was then closed at the supraumbilical site and the skin incisions were approximated using 4-0 Monocryl sutures.  SPECIMEN:  Uterus, cervix, both fallopian tubes sent to  Pathology.  ESTIMATED BLOOD LOSS:  350.  URINE OUTPUT:  450, clear yellow urine.  INTRAOPERATIVE FLUID:  1700 mL crystalloid and 500 mL of Hespan.  Sponge and instrument counts x2 was correct.  COMPLICATION:  None.  The patient tolerated the procedure well and was transferred to recovery in stable condition.     Maxie Better, M.D.     [place]/MEDQ  D:  04/16/2011  T:  04/17/2011  Job:  [id number]

## 2011-04-17 NOTE — Progress Notes (Signed)
Subjective: Patient reports tolerating PO.   Intraop events reviewed with pt. Notes labile BP  Objective: I have reviewed patient's vital signs.  vital signs, intake and output and labs. Filed Vitals:   04/17/11 0558  BP: 124/80  Pulse: 78  Temp: 97.9 F (36.6 C)  Resp: 18   I/O last 3 completed shifts: In: 4064.2 [P.O.:60; I.V.:3229.2; Other:175; IV Piggyback:600] Out: 2250 [Urine:1900; Blood:350]    Lab Results  Component Value Date   WBC 7.5 04/17/2011   HGB 10.0* 04/17/2011   HCT 29.5* 04/17/2011   MCV 89.7 04/17/2011   PLT 205 04/17/2011   Lab Results  Component Value Date   CREATININE 0.78 04/17/2011    EXAM General: alert, cooperative and no distress Resp: clear to auscultation bilaterally Cardio: regular rate and rhythm, S1, S2 normal, no murmur, click, rub or gallop GI: soft, non-tender; bowel sounds normal; no masses,  no organomegaly and incision: dry, intact and left side of dressing stained with blood Extremities: no edema, redness or tenderness in the calves or thighs Vaginal Bleeding: minimal  Assessment: s/p Procedure(s):EXPLORATORY LAPAROTOMY, BILATERAL SALPINGECTOMY HYSTERECTOMY ABDOMINAL(TOTAL) ATTEMPTED ROBOTIC ASSISTED TOTAL HYSTERECTOMY: stable, progressing well, tolerating diet and anemia  Plan: Encourage ambulation Advance to PO medication D/C pca HL IV  LOS: 1 day    Alitzel Cookson A, MD 04/17/2011 8:51 AM    04/17/2011, 8:51 AM

## 2011-04-18 MED ORDER — OXYCODONE-ACETAMINOPHEN 5-325 MG PO TABS: 1.0000 | ORAL_TABLET | ORAL | Status: AC | PRN

## 2011-04-18 MED ORDER — IBUPROFEN 800 MG PO TABS: 800.0000 mg | ORAL_TABLET | Freq: Three times a day (TID) | ORAL | Status: AC | PRN

## 2011-04-18 MED ORDER — BISACODYL 10 MG RE SUPP
10.0000 mg | Freq: Once | RECTAL | Status: AC
  Administered 2011-04-18: 10 mg via RECTAL
  Filled 2011-04-18: qty 1

## 2011-04-18 NOTE — Progress Notes (Signed)
Subjective: Patient reports tolerating PO, + flatus and no problems voiding.    Objective: I have reviewed patient's vital signs.  vital signs, intake and output and labs. Filed Vitals:   04/18/11 0520  BP: 129/75  Pulse: 73  Temp: 97.9 F (36.6 C)  Resp: 18   I/O last 3 completed shifts: In: 1229.2 [I.V.:1129.2; IV Piggyback:100] Out: 1950 [Urine:1950]    Lab Results  Component Value Date   WBC 7.5 04/17/2011   HGB 10.0* 04/17/2011   HCT 29.5* 04/17/2011   MCV 89.7 04/17/2011   PLT 205 04/17/2011   Lab Results  Component Value Date   CREATININE 0.78 04/17/2011    EXAM General: alert, cooperative and no distress Resp: clear to auscultation bilaterally Cardio: regular rate and rhythm, S1, S2 normal, no murmur, click, rub or gallop GI: soft, non-tender; bowel sounds normal; no masses,  no organomegaly and incision: well approximated, subcuticular sutures robotic sites some ecchymosis( supraumbilical site) Extremities: no edema, redness or tenderness in the calves or thighs Vaginal Bleeding: minimal  Assessment: s/p Procedure(s):EXPLORATORY LAPAROTOMY, BILATERAL SALPINGECTOMY HYSTERECTOMY ABDOMINAL TOTAL ATTEMPTED ROBOTIC ASSISTED TOTAL HYSTERECTOMY: stable, progressing well and tolerating diet  Plan: Discontinue IV fluids Discharge home D/C INSTRUCTIONS REVIEWED. Script: percocet, motrin. F/u 6 wk  LOS: 2 days    Katrina Liu A, MD 04/18/2011 8:20 AM    04/18/2011, 8:20 AM

## 2011-04-18 NOTE — Discharge Summary (Signed)
Physician Discharge Summary  Patient ID: Katrina Liu MRN: [id number] DOB/AGE: 1960-11-30 50 y.o.  Admit date: 04/16/2011 Discharge date: 04/18/2011  Admission Diagnoses:menorrhagia, uterine fibroids, anemia, pelvic pain/dysmenorrhea  Discharge Diagnoses: same  Principal Problem:  *S/P abdominal hysterectomy attempted  DaVinci robotic hysterectomy, exploratory laparotomy, TAH, bilateral salpingectomy Discharged Condition: stable  Hospital Course: uncomplicated postop course  Consults: none  Significant Diagnostic Studies: labs: POD #1 creatinine 0.78. hgb 9.0 hct 29.5 plt 205K, wbc 7.5  Treatments: surgery:   Discharge Exam: Blood pressure 129/75, pulse 73, temperature 97.9 F (36.6 C), temperature source Oral, resp. rate 18, height 5' 5.5" (1.664 m), weight 65.772 kg (145 lb), last menstrual period 04/16/2011, SpO2 99.00%. General appearance: alert, cooperative and no distress Back: no tenderness to percussion or palpation, symmetric, no curvature. ROM normal. No CVA tenderness. Resp: clear to auscultation bilaterally Cardio: regular rate and rhythm, S1, S2 normal, no murmur, click, rub or gallop GI: soft, non-tender; bowel sounds normal; no masses,  no organomegaly Pelvic: deferred Extremities: no edema, redness or tenderness in the calves or thighs Incision/Wound:  Disposition: Final discharge disposition not confirmed  Discharge Orders    Future Orders Please Complete By Expires   Diet - low sodium heart healthy      Increase activity slowly      Discharge instructions      Comments:   Call if temperature greater than equal to 100.4, nothing per vagina for 4-6 weeks or severe nausea vomiting, increased incisional pain , drainage or redness in the incision site, no straining with bowel movements, showers no bath   No dressing needed        Medication List  As of 04/18/2011  8:19 AM   START taking these medications         oxyCODONE-acetaminophen 5-325 MG per  tablet   Commonly known as: PERCOCET   Take 1-2 tablets by mouth every 3 (three) hours as needed (moderate to severe pain (when tolerating fluids)).         CHANGE how you take these medications         ibuprofen 800 MG tablet   Commonly known as: ADVIL,MOTRIN   Take 1 tablet (800 mg total) by mouth every 8 (eight) hours as needed for pain (mild pain).   What changed: - how often to take the med - reasons to take the med - doctor's instructions         CONTINUE taking these medications         aspirin-acetaminophen-caffeine 250-250-65 MG per tablet   Commonly known as: EXCEDRIN MIGRAINE      Iron 325 (65 FE) MG Tabs      mometasone 0.1 % ointment   Commonly known as: ELOCON          Where to get your medications    These are the prescriptions that you need to pick up.   You may get these medications from any pharmacy.         ibuprofen 800 MG tablet   oxyCODONE-acetaminophen 5-325 MG per tablet           Follow-up Information    Follow up with Axil Copeman A, MD in 3 weeks.   Contact information:   [address] [place] [address] [phone]          Signed: Serita Kyle 04/18/2011, 8:19 AM

## 2011-05-01 HISTORY — PX: BREAST BIOPSY: SHX20

## 2011-08-14 ENCOUNTER — Other Ambulatory Visit: Payer: Self-pay | Admitting: Obstetrics and Gynecology

## 2011-08-14 DIAGNOSIS — Z1231 Encounter for screening mammogram for malignant neoplasm of breast: Secondary | ICD-10-CM

## 2011-09-11 ENCOUNTER — Ambulatory Visit
Admission: RE | Admit: 2011-09-11 | Discharge: 2011-09-11 | Disposition: A | Discharge status: Home / Self Care | Payer: BC Managed Care – PPO | Source: Ambulatory Visit | Attending: Obstetrics and Gynecology | Admitting: Obstetrics and Gynecology

## 2011-09-11 DIAGNOSIS — Z1231 Encounter for screening mammogram for malignant neoplasm of breast: Secondary | ICD-10-CM

## 2011-09-12 ENCOUNTER — Other Ambulatory Visit: Payer: Self-pay | Admitting: Obstetrics and Gynecology

## 2011-09-12 DIAGNOSIS — R928 Other abnormal and inconclusive findings on diagnostic imaging of breast: Secondary | ICD-10-CM

## 2011-09-19 ENCOUNTER — Ambulatory Visit
Admission: RE | Admit: 2011-09-19 | Discharge: 2011-09-19 | Disposition: A | Discharge status: Home / Self Care | Payer: BC Managed Care – PPO | Source: Ambulatory Visit | Attending: Obstetrics and Gynecology | Admitting: Obstetrics and Gynecology

## 2011-09-19 ENCOUNTER — Other Ambulatory Visit: Payer: Self-pay | Admitting: Obstetrics and Gynecology

## 2011-09-19 DIAGNOSIS — R928 Other abnormal and inconclusive findings on diagnostic imaging of breast: Secondary | ICD-10-CM

## 2011-10-01 ENCOUNTER — Ambulatory Visit
Admission: RE | Admit: 2011-10-01 | Discharge: 2011-10-01 | Disposition: A | Discharge status: Home / Self Care | Payer: BC Managed Care – PPO | Source: Ambulatory Visit | Attending: Obstetrics and Gynecology | Admitting: Obstetrics and Gynecology

## 2011-10-01 ENCOUNTER — Other Ambulatory Visit: Payer: Self-pay | Admitting: Obstetrics and Gynecology

## 2011-10-01 DIAGNOSIS — R928 Other abnormal and inconclusive findings on diagnostic imaging of breast: Secondary | ICD-10-CM

## 2012-12-22 ENCOUNTER — Emergency Department (HOSPITAL_COMMUNITY): Payer: BC Managed Care – PPO

## 2012-12-22 ENCOUNTER — Inpatient Hospital Stay (HOSPITAL_COMMUNITY): Payer: BC Managed Care – PPO

## 2012-12-22 ENCOUNTER — Encounter (HOSPITAL_COMMUNITY): Payer: Self-pay | Admitting: Emergency Medicine

## 2012-12-22 ENCOUNTER — Inpatient Hospital Stay (HOSPITAL_COMMUNITY)
Admission: EM | Admit: 2012-12-22 | Discharge: 2012-12-25 | DRG: 008 | Disposition: A | Discharge status: Home / Self Care | Payer: BC Managed Care – PPO | Attending: Internal Medicine | Admitting: Internal Medicine

## 2012-12-22 DIAGNOSIS — R209 Unspecified disturbances of skin sensation: Secondary | ICD-10-CM | POA: Diagnosis present

## 2012-12-22 DIAGNOSIS — Z90710 Acquired absence of both cervix and uterus: Secondary | ICD-10-CM

## 2012-12-22 DIAGNOSIS — I10 Essential (primary) hypertension: Secondary | ICD-10-CM | POA: Diagnosis present

## 2012-12-22 DIAGNOSIS — R4701 Aphasia: Secondary | ICD-10-CM | POA: Diagnosis present

## 2012-12-22 DIAGNOSIS — Z8673 Personal history of transient ischemic attack (TIA), and cerebral infarction without residual deficits: Secondary | ICD-10-CM

## 2012-12-22 DIAGNOSIS — G464 Cerebellar stroke syndrome: Secondary | ICD-10-CM | POA: Diagnosis present

## 2012-12-22 DIAGNOSIS — R42 Dizziness and giddiness: Secondary | ICD-10-CM | POA: Diagnosis present

## 2012-12-22 DIAGNOSIS — D649 Anemia, unspecified: Secondary | ICD-10-CM | POA: Diagnosis present

## 2012-12-22 DIAGNOSIS — I6789 Other cerebrovascular disease: Secondary | ICD-10-CM

## 2012-12-22 DIAGNOSIS — K219 Gastro-esophageal reflux disease without esophagitis: Secondary | ICD-10-CM | POA: Diagnosis present

## 2012-12-22 DIAGNOSIS — G43909 Migraine, unspecified, not intractable, without status migrainosus: Secondary | ICD-10-CM | POA: Diagnosis not present

## 2012-12-22 DIAGNOSIS — R29810 Facial weakness: Secondary | ICD-10-CM | POA: Diagnosis present

## 2012-12-22 DIAGNOSIS — I6350 Cerebral infarction due to unspecified occlusion or stenosis of unspecified cerebral artery: Principal | ICD-10-CM | POA: Diagnosis present

## 2012-12-22 HISTORY — DX: Cerebral infarction, unspecified: I63.9

## 2012-12-22 LAB — COMPREHENSIVE METABOLIC PANEL
ALT: 11 U/L (ref 0–35)
AST: 15 U/L (ref 0–37)
Albumin: 4 g/dL (ref 3.5–5.2)
CO2: 29 mEq/L (ref 19–32)
Chloride: 101 mEq/L (ref 96–112)
GFR calc non Af Amer: 83 mL/min — ABNORMAL LOW (ref >90)
Potassium: 3.5 mEq/L (ref 3.5–5.1)
Sodium: 141 mEq/L (ref 135–145)
Total Bilirubin: 0.5 mg/dL (ref 0.3–1.2)

## 2012-12-22 LAB — APTT: aPTT: 28 seconds (ref 24–37)

## 2012-12-22 LAB — URINALYSIS, ROUTINE W REFLEX MICROSCOPIC
Bilirubin Urine: NEGATIVE
Glucose, UA: NEGATIVE mg/dL
Hgb urine dipstick: NEGATIVE
Protein, ur: NEGATIVE mg/dL
Urobilinogen, UA: 0.2 mg/dL (ref 0.0–1.0)

## 2012-12-22 LAB — CBC
HCT: 39.6 % (ref 36.0–46.0)
Platelets: 226 10*3/uL (ref 150–400)
RDW: 12.6 % (ref 11.5–15.5)
WBC: 6 10*3/uL (ref 4.0–10.5)

## 2012-12-22 LAB — DIFFERENTIAL
Basophils Absolute: 0 10*3/uL (ref 0.0–0.1)
Lymphocytes Relative: 16 % (ref 12–46)
Neutro Abs: 4.6 10*3/uL (ref 1.7–7.7)
Neutrophils Relative %: 77 % (ref 43–77)

## 2012-12-22 LAB — PROTIME-INR: INR: 1.06 (ref 0.00–1.49)

## 2012-12-22 LAB — ETHANOL: Alcohol, Ethyl (B): <11 mg/dL (ref 0–11)

## 2012-12-22 MED ORDER — ASPIRIN EC 81 MG PO TBEC
81.0000 mg | DELAYED_RELEASE_TABLET | Freq: Every day | ORAL | Status: DC
  Administered 2012-12-22 – 2012-12-25 (×4): 81 mg via ORAL
  Filled 2012-12-22 (×4): qty 1

## 2012-12-22 MED ORDER — MECLIZINE HCL 25 MG PO TABS
25.0000 mg | ORAL_TABLET | Freq: Once | ORAL | Status: AC
  Administered 2012-12-22: 25 mg via ORAL
  Filled 2012-12-22: qty 1

## 2012-12-22 MED ORDER — HYDROCHLOROTHIAZIDE 25 MG PO TABS: 25.0000 mg | ORAL_TABLET | Freq: Every day | ORAL | Status: DC

## 2012-12-22 MED ORDER — ACETAMINOPHEN 325 MG PO TABS
650.0000 mg | ORAL_TABLET | Freq: Four times a day (QID) | ORAL | Status: DC | PRN
  Administered 2012-12-22: 650 mg via ORAL
  Filled 2012-12-22: qty 2

## 2012-12-22 MED ORDER — HYDROCHLOROTHIAZIDE 25 MG PO TABS
25.0000 mg | ORAL_TABLET | Freq: Every day | ORAL | Status: DC
  Administered 2012-12-22 – 2012-12-25 (×4): 25 mg via ORAL
  Filled 2012-12-22 (×4): qty 1

## 2012-12-22 MED ORDER — SENNOSIDES-DOCUSATE SODIUM 8.6-50 MG PO TABS: 1.0000 | ORAL_TABLET | Freq: Every evening | ORAL | Status: DC | PRN

## 2012-12-22 MED ORDER — ENOXAPARIN SODIUM 40 MG/0.4ML ~~LOC~~ SOLN
40.0000 mg | Freq: Every day | SUBCUTANEOUS | Status: DC
  Administered 2012-12-22 – 2012-12-23 (×2): 40 mg via SUBCUTANEOUS
  Filled 2012-12-22 (×4): qty 0.4

## 2012-12-22 MED ORDER — ADULT MULTIVITAMIN W/MINERALS CH
1.0000 | ORAL_TABLET | Freq: Every day | ORAL | Status: DC
  Administered 2012-12-22 – 2012-12-25 (×3): 1 via ORAL
  Filled 2012-12-22 (×4): qty 1

## 2012-12-22 NOTE — Progress Notes (Signed)
VASCULAR LAB PRELIMINARY  PRELIMINARY  PRELIMINARY  PRELIMINARY  Carotid duplex completed.    Preliminary report:  Bilateral:  1-39% ICA stenosis.  Vertebral artery flow is antegrade.     Katrina Liu, RVS 12/22/2012, 5:02 PM

## 2012-12-22 NOTE — ED Notes (Signed)
Family at bedside. 

## 2012-12-22 NOTE — ED Notes (Signed)
Pt to ED via [facility]- pt was at home getting ready for work when she noticed right sided facial droop, couldn't hear out of her right ear, couldn't see out of right eye- fire was first on scene, noticed facial droop to the right and slurred speech.  EMS arrival at 0826 symptoms had resolved, neuro negative at present.  Pt currently complaining of some dizziness.  CBG 95, HTN 170/100.  Alert and oriented, IV in place.

## 2012-12-22 NOTE — ED Notes (Signed)
Patient is resting comfortably. 

## 2012-12-22 NOTE — Progress Notes (Signed)
  Echocardiogram 2D Echocardiogram has been performed.  Georgian Co 12/22/2012, 4:44 PM

## 2012-12-22 NOTE — ED Notes (Signed)
RN at the bedside 

## 2012-12-22 NOTE — Consult Note (Signed)
Referring Physician: Dr. Clarene Duke    Chief Complaint: Acute onset of vertigo and nausea with associated right facial numbness.  HPI: Katrina Liu is an 52 y.o. female with a history of hypertension presenting with complaint of acute onset of vertigo and nausea with associated numbness involving the right side of her face starting at 7 AM this morning. She has no history of stroke nor TIA. She has not been on antiplatelet therapy. CT scan of her head showed no acute intracranial abnormality. 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 MRI of the brain showed acute as well as chronic non-hemorrhagic right PICA territory infarctions. 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 Vertigo subsided for the most part. Numbness resolved. Patient has not experienced coordination difficulty. NIH stroke score was 0.  LSN: 7 AM on 12/22/2012 tPA Given: No: Rapid resolution of deficits MRankin: 0  Past Medical History  Diagnosis Date  . Headache(784.0)   . Anemia   . GERD (gastroesophageal reflux disease)     h/o - not now  . S/P abdominal hysterectomy 04/17/2011    No family history on file.   Medications: I have reviewed the patient's current medications.  ROS: History obtained from the patient  General ROS: negative for - chills, fatigue, fever, night sweats, weight gain or weight loss Psychological ROS: negative for - behavioral disorder, hallucinations, memory difficulties, mood swings or suicidal ideation Ophthalmic ROS: negative for - blurry vision, double vision, eye pain or loss of vision ENT ROS: negative for - epistaxis, nasal discharge, oral lesions, sore throat, tinnitus or vertigo Allergy and Immunology ROS: negative for - hives or itchy/watery eyes Hematological and Lymphatic ROS: negative for - bleeding problems, bruising or swollen lymph nodes Endocrine  ROS: negative for - galactorrhea, hair pattern changes, polydipsia/polyuria or temperature intolerance Respiratory ROS: negative for - cough, hemoptysis, shortness of breath or wheezing Cardiovascular ROS: negative for - chest pain, dyspnea on exertion, edema or irregular heartbeat Gastrointestinal ROS: negative for - abdominal pain, diarrhea, hematemesis, nausea/vomiting or stool incontinence Genito-Urinary ROS: negative for - dysuria, hematuria, incontinence or urinary frequency/urgency Musculoskeletal ROS: negative for - joint swelling or muscular weakness Neurological ROS: as noted in HPI Dermatological ROS: negative for rash and skin lesion changes  Physical Examination: Blood pressure 161/88, pulse 98, temperature 98.1 F (36.7 C), temperature source Oral, resp. rate 17, last menstrual period 03/17/2011, SpO2 100.00%.  Neurologic Examination: Mental Status: Alert, oriented, thought content appropriate.  Speech fluent without evidence of aphasia. Able to follow commands without difficulty. Cranial Nerves: II-Visual fields were normal. III/IV/VI-Pupils were equal and reacted. Extraocular movements were full and conjugate.    V/VII-no facial numbness and no facial weakness. VIII-normal. X-normal speech and symmetrical palatal movement. Motor: 5/5 bilaterally with normal tone and bulk Sensory: Normal throughout. Deep Tendon Reflexes: 2+ and symmetric. Plantars: Flexor bilaterally Cerebellar: Normal finger-to-nose and heel-to-shin testing bilaterally. Carotid auscultation: Normal  Ct Head Wo Contrast  12/22/2012   *RADIOLOGY REPORT*  Clinical Data: Right-sided facial droop.  Slurred speech.  CT HEAD WITHOUT CONTRAST  Technique:  Contiguous axial images were obtained from the base of the skull through the vertex without contrast.  Comparison: No priors.  Findings: Faint physiologic calcifications are noted within the basal ganglia bilaterally. Multiple well-defined areas of low  attenuation in the right cerebellar hemisphere are compatible with old infarctions. No acute intracranial abnormalities. Specifically, no evidence of acute intracranial  hemorrhage, no definite findings of acute/subacute cerebral ischemia, no mass, mass effect, hydrocephalus or abnormal intra or extra-axial fluid collections.  Visualized paranasal sinuses and mastoids are well pneumatized.  No acute displaced skull fractures are identified.  IMPRESSION: 1.  No acute intracranial abnormalities. 2.  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   Original Report Authenticated By: Trudie Reed, M.D.   Mr [facility] Wo Contrast  12/22/2012   *RADIOLOGY REPORT*  Clinical Data:  Dizziness.  MRI HEAD WITHOUT CONTRAST MRA HEAD WITHOUT CONTRAST  Technique:  Multiplanar, multiecho pulse sequences of the brain and surrounding structures were obtained without intravenous contrast. Angiographic images of the head were obtained using MRA technique without contrast.  Comparison:  CT head without contrast from the same day.  MRI HEAD  Findings:  Multiple acute non hemorrhagic right PICA territory infarcts are scattered amount stool several old lacunar infarcts in the same territory.  No other vascular territory infarct is present.  Minimal supratentorial white matter disease is at the upper limits of normal for age.  No hemorrhage or mass lesion is present.  The ventricles are of normal size.  No significant extra- axial fluid collection is present.  T2 hyperintensity is associated with the acute right PICA territory lesions.  There is mild sulcal effacement without significant mass effect on the fourth ventricle or midline shift.  The vertebral arteries and basilar artery are small but patent. The anterior circulation is within normal limits.  The globes orbits are intact.  The paranasal sinuses and mastoid air cells are clear.  IMPRESSION:  1.  Acute and chronic non hemorrhagic right PICA territory infarcts  with associated edema and mild sulcal effacement but no significant to mass effect or midline shift. 2.  Supratentorial subcortical white matter changes are at the upper limits of normal for age. 3.  No acute supratentorial abnormality.  MRA HEAD  Findings: The internal carotid arteries are within normal limits from high cervical segments through the ICA termini bilaterally. The A1 and M1 segments are normal.  The anterior communicating artery is patent.  ACA and MCA branch vessels are within normal limits.  The vertebral arteries and basilar artery are hypoplastic.  The left PICA origin is visualized and within normal limits.  The right PICA is significantly more faint, suggesting a focal stenosis. Both posterior cerebral arteries are predominately of fetal type with small P1 segments, right greater than left.  The PCA branch vessels are within normal limits.  IMPRESSION:  1.  Asymmetric signal intensity in the PICA artery suggests a stenosis on the right, corresponding to the infarcted territories. 2.  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   Original Report Authenticated By: Marin Roberts, M.D.   Mr Brain Wo Contrast  12/22/2012   *RADIOLOGY REPORT*  Clinical Data:  Dizziness.  MRI HEAD WITHOUT CONTRAST MRA HEAD WITHOUT CONTRAST  Technique:  Multiplanar, multiecho pulse sequences of the brain and surrounding structures were obtained without intravenous contrast. Angiographic images of the head were obtained using MRA technique without contrast.  Comparison:  CT head without contrast from the same day.  MRI HEAD  Findings:  Multiple acute non hemorrhagic right PICA territory infarcts are scattered amount stool several old lacunar infarcts in the same territory.  No other vascular territory infarct is present.  Minimal supratentorial white matter disease is at the upper limits of normal for age.  No hemorrhage or mass lesion is present.   The ventricles  are of normal size.  No significant extra- axial fluid collection is present.  T2 hyperintensity is associated with the acute right PICA territory lesions.  There is mild sulcal effacement without significant mass effect on the fourth ventricle or midline shift.  The vertebral arteries and basilar artery are small but patent. The anterior circulation is within normal limits.  The globes orbits are intact.  The paranasal sinuses and mastoid air cells are clear.  IMPRESSION:  1.  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 2.  Supratentorial subcortical white matter changes are at the upper limits of normal for age. 3.  No acute supratentorial abnormality.  MRA HEAD  Findings: The internal carotid arteries are within normal limits from high cervical segments through the ICA termini bilaterally. The A1 and M1 segments are normal.  The anterior communicating artery is patent.  ACA and MCA branch vessels are within normal limits.  The vertebral arteries and basilar artery are hypoplastic.  The left PICA origin is visualized and within normal limits.  The right PICA is significantly more faint, suggesting a focal stenosis. Both posterior cerebral arteries are predominately of fetal type with small P1 segments, right greater than left.  The PCA branch vessels are within normal limits.  IMPRESSION:  1.  Asymmetric signal intensity in the PICA artery suggests a stenosis on the right, corresponding to the infarcted territories. 2.  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   Original Report Authenticated By: Marin Roberts, M.D.    Assessment: 52 y.o. 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  Stroke Risk Factors - hypertension  Plan: 1. HgbA1c, fasting  lipid panel 2. PT consult, OT consult 3. Echocardiogram 4. Carotid dopplers 5. Prophylactic therapy-Antiplatelet med: Aspirin 81 mg per day 6. Risk factor modification 7. Telemetry monitoring   C.R. Roseanne Reno, MD [facility] [phone]  12/22/2012, 12:10 PM

## 2012-12-22 NOTE — ED Provider Notes (Signed)
CSN: [id number]     Arrival date & time 12/22/12  0911 History     First MD Initiated Contact with Patient 12/22/12 [id number]     Chief Complaint  Patient presents with  . Dizziness  . Facial Droop  . Aphasia    HPI Pt was seen at 0925. Per EMS and pt report, c/o sudden onset and resolution of one episode of right facial droop, blurred vision, decreased hearing right ear, and slurred speech that occurred this morning PTA. Pt states she awoke at approx 0645 and "felt fine." States she proceeded with her usual morning routine when she suddenly felt "dizzy." Describes the dizziness as "everything was spinning." States she was "unable to walk straight" due to the dizziness. Was associated with nausea. States she laid down with improvement in her symptoms. States she then stood up and walked into the kitchen to make coffee, approx 0740, when her symptoms recurred. Pt states at this time, she also felt her right face "draw up," and become "numb," as well as her vision became "blurry," her speech "sounded slurred," and she "couldn't hear out of my right ear." Pt states she tried to dial 911 on the phone with her right hand and "kept misdialing the number" so she switched the phone to her left hand and was able to dial it. Upon fire department arrival to scene, they noted pt's right facial droop and slurred speech. EMS arrived at [phone] and noted pt's symptoms were resolved. Pt states she feels back to her baseline at this time. Denies tingling/numbness in extremities, no focal motor weakness, no syncope/near syncope, no CP/palpitations, no abd pain, no vomiting/diarrhea, no headache, no eyes pain.    Past Medical History  Diagnosis Date  . Headache(784.0)   . Anemia   . GERD (gastroesophageal reflux disease)     h/o - not now  . S/P abdominal hysterectomy 04/17/2011   Past Surgical History  Procedure Laterality Date  . Laparotomy      ectopic pregnancy  . Shoulder arthroscopy    . Kidney stone surgery     . Abdominal hysterectomy  04/16/2011    Procedure: HYSTERECTOMY ABDOMINAL;  Surgeon: Serita Kyle, MD;  Location: [facility];  Service: Gynecology;  Laterality: N/A;    History  Substance Use Topics  . Smoking status: Never Smoker   . Smokeless tobacco: Not on file  . Alcohol Use: No    Review of Systems ROS: Statement: All systems negative except as marked or noted in the HPI; Constitutional: Negative for fever and chills. ; ; Eyes: +blurred vision. Negative for eye pain, redness and discharge. ; ; ENMT: +decreased hearing right ear. Negative for ear pain, hoarseness, nasal congestion, sinus pressure and sore throat. ; ; Cardiovascular: Negative for chest pain, palpitations, diaphoresis, dyspnea and peripheral edema. ; ; Respiratory: Negative for cough, wheezing and stridor. ; ; Gastrointestinal: Negative for nausea, vomiting, diarrhea, abdominal pain, blood in stool, hematemesis, jaundice and rectal bleeding. . ; ; Genitourinary: Negative for dysuria, flank pain and hematuria. ; ; Musculoskeletal: Negative for back pain and neck pain. Negative for swelling and trauma.; ; Skin: Negative for pruritus, rash, abrasions, blisters, bruising and skin lesion.; ; Neuro: +dizziness, facial droop, slurred speech. Negative for headache, lightheadedness and neck stiffness. Negative for weakness, altered level of consciousness , altered mental status, extremity weakness, paresthesias, involuntary movement, seizure and syncope.     Allergies  Review of patient's allergies indicates no known allergies.  Home Medications   Current  Outpatient Rx  Name  Route  Sig  Dispense  Refill  . aspirin-acetaminophen-caffeine (EXCEDRIN MIGRAINE) 250-250-65 MG per tablet   Oral   Take 1 tablet by mouth every 6 (six) hours as needed. For headaches          . hydrochlorothiazide (HYDRODIURIL) 25 MG tablet   Oral   Take 25 mg by mouth daily.         . mometasone (ELOCON) 0.1 % ointment   Topical   Apply  1 application topically daily.           . Multiple Vitamins-Minerals (MULTIVITAMIN WITH MINERALS) tablet   Oral   Take 1 tablet by mouth daily. Hair, Skin and Nails          BP 148/86  Pulse 87  Temp(Src) 98.1 F (36.7 C) (Oral)  Resp 17  SpO2 99%  LMP 03/17/2011 Physical Exam 0930: Physical examination:  Nursing notes reviewed; Vital signs and O2 SAT reviewed;  Constitutional: Well developed, Well nourished, Well hydrated, In no acute distress; Head:  Normocephalic, atraumatic; Eyes: EOMI, PERRL, No scleral icterus; ENMT: TM's clear bilat. +edemetous nasal turbinates bilat with clear rhinorrhea.  Mouth and pharynx normal, Mucous membranes moist; Neck: Supple, Full range of motion, No lymphadenopathy; Cardiovascular: Regular rate and rhythm, No murmur, rub, or gallop; Respiratory: Breath sounds clear & equal bilaterally, No rales, rhonchi, wheezes.  Speaking full sentences with ease, Normal respiratory effort/excursion; Chest: Nontender, Movement normal; Abdomen: Soft, Nontender, Nondistended, Normal bowel sounds; Genitourinary: No CVA tenderness; Extremities: Pulses normal, No tenderness, No edema, No calf edema or asymmetry.; Neuro: AA&Ox3, Major CN grossly intact.  Strength 5/5 equal bilat UE's and LE's.  DTR 2/4 equal bilat UE's and LE's.  No gross sensory deficits.  Normal cerebellar testing bilat UE's (finger-nose) and LE's (heel-shin). Speech clear.  No facial droop. +left horizontal end gaze fatigable nystagmus..; Skin: Color normal, Warm, Dry.   ED Course   Procedures    MDM  MDM Reviewed: previous chart, nursing note and vitals Reviewed previous: labs Interpretation: labs, ECG and CT scan    Date: 12/22/2012  Rate: 78  Rhythm: normal sinus rhythm  QRS Axis: normal  Intervals: normal  ST/T Wave abnormalities: nonspecific T wave changes  Conduction Disutrbances:none  Narrative Interpretation:   Old EKG Reviewed: none available.  Results for orders placed during  the hospital encounter of 12/22/12  GLUCOSE, CAPILLARY      Result Value Range   Glucose-Capillary 113 (*) 70 - 99 mg/dL   Comment 1 Notify RN     Comment 2 Documented in Chart    ETHANOL      Result Value Range   Alcohol, Ethyl (B) <11  0 - 11 mg/dL  PROTIME-INR      Result Value Range   Prothrombin Time 13.6  11.6 - 15.2 seconds   INR 1.06  0.00 - 1.49  APTT      Result Value Range   aPTT 28  24 - 37 seconds  CBC      Result Value Range   WBC 6.0  4.0 - 10.5 K/uL   RBC 4.51  3.87 - 5.11 MIL/uL   Hemoglobin 14.1  12.0 - 15.0 g/dL   HCT 78.2  95.6 - 21.3 %   MCV 87.8  78.0 - 100.0 fL   MCH 31.3  26.0 - 34.0 pg   MCHC 35.6  30.0 - 36.0 g/dL   RDW 08.6  57.8 - 46.9 %   Platelets 226  150 - 400 K/uL  DIFFERENTIAL      Result Value Range   Neutrophils Relative % 77  43 - 77 %   Neutro Abs 4.6  1.7 - 7.7 K/uL   Lymphocytes Relative 16  12 - 46 %   Lymphs Abs 0.9  0.7 - 4.0 K/uL   Monocytes Relative 6  3 - 12 %   Monocytes Absolute 0.4  0.1 - 1.0 K/uL   Eosinophils Relative 0  0 - 5 %   Eosinophils Absolute 0.0  0.0 - 0.7 K/uL   Basophils Relative 0  0 - 1 %   Basophils Absolute 0.0  0.0 - 0.1 K/uL  COMPREHENSIVE METABOLIC PANEL      Result Value Range   Sodium 141  135 - 145 mEq/L   Potassium 3.5  3.5 - 5.1 mEq/L   Chloride 101  96 - 112 mEq/L   CO2 29  19 - 32 mEq/L   Glucose, Bld 99  70 - 99 mg/dL   BUN 11  6 - 23 mg/dL   Creatinine, Ser 4.09  0.50 - 1.10 mg/dL   Calcium 81.1  8.4 - 91.4 mg/dL   Total Protein 7.3  6.0 - 8.3 g/dL   Albumin 4.0  3.5 - 5.2 g/dL   AST 15  0 - 37 U/L   ALT 11  0 - 35 U/L   Alkaline Phosphatase 61  39 - 117 U/L   Total Bilirubin 0.5  0.3 - 1.2 mg/dL   GFR calc non Af Amer 83 (*) >90 mL/min   GFR calc Af Amer >90  >90 mL/min  TROPONIN I      Result Value Range   Troponin I <0.30  <0.30 ng/mL   Ct Head Wo Contrast 12/22/2012   *RADIOLOGY REPORT*  Clinical Data: Right-sided facial droop.  Slurred speech.  CT HEAD WITHOUT CONTRAST   Technique:  Contiguous axial images were obtained from the base of the skull through the vertex without contrast.  Comparison: No priors.  Findings: Faint physiologic calcifications are noted within the basal ganglia bilaterally. Multiple well-defined areas of low attenuation in the right cerebellar hemisphere are compatible with old infarctions. No acute intracranial abnormalities. Specifically, no evidence of acute intracranial hemorrhage, no definite findings of acute/subacute cerebral ischemia, no mass, mass effect, hydrocephalus or abnormal intra or extra-axial fluid collections.  Visualized paranasal sinuses and mastoids are well pneumatized.  No acute displaced skull fractures are identified.  IMPRESSION: 1.  No acute intracranial abnormalities. 2.  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   Original Report Authenticated By: Trudie Reed, M.D.     1105:  T/C from MRI Tech: states pt appears to have had acute CVA on MRI, will add MRA. T/C to Neuro Dr. Roseanne Reno, case discussed, including:  HPI, pertinent PM/SHx, VS/PE, dx testing, ED course and treatment:  Agreeable to come to ED for consult.    Mr Maxine Glenn Head Wo Contrast 12/22/2012   *RADIOLOGY REPORT*  Clinical Data:  Dizziness.  MRI HEAD WITHOUT CONTRAST MRA HEAD WITHOUT CONTRAST  Technique:  Multiplanar, multiecho pulse sequences of the brain and surrounding structures were obtained without intravenous contrast. Angiographic images of the head were obtained using MRA technique without contrast.  Comparison:  CT head without contrast from the same day.  MRI HEAD  Findings:  Multiple acute non hemorrhagic right PICA territory infarcts are scattered amount stool several old lacunar infarcts in the same territory.  No other vascular territory infarct is  present.  Minimal supratentorial white matter disease is at the upper limits of normal for age.  No hemorrhage or mass lesion is present.  The ventricles are of normal size.  No  significant extra- axial fluid collection is present.  T2 hyperintensity is associated with the acute right PICA territory lesions.  There is mild sulcal effacement without significant mass effect on the fourth ventricle or midline shift.  The vertebral arteries and basilar artery are small but patent. The anterior circulation is within normal limits.  The globes orbits are intact.  The paranasal sinuses and mastoid air cells are clear.  IMPRESSION:  1.  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 2.  Supratentorial subcortical white matter changes are at the upper limits of normal for age. 3.  No acute supratentorial abnormality.  MRA HEAD  Findings: The internal carotid arteries are within normal limits from high cervical segments through the ICA termini bilaterally. The A1 and M1 segments are normal.  The anterior communicating artery is patent.  ACA and MCA branch vessels are within normal limits.  The vertebral arteries and basilar artery are hypoplastic.  The left PICA origin is visualized and within normal limits.  The right PICA is significantly more faint, suggesting a focal stenosis. Both posterior cerebral arteries are predominately of fetal type with small P1 segments, right greater than left.  The PCA branch vessels are within normal limits.  IMPRESSION:  1.  Asymmetric signal intensity in the PICA artery suggests a stenosis on the right, corresponding to the infarcted territories. 2.  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   Original Report Authenticated By: Marin Roberts, M.D.   Mr Brain Wo Contrast 12/22/2012   *RADIOLOGY REPORT*  Clinical Data:  Dizziness.  MRI HEAD WITHOUT CONTRAST MRA HEAD WITHOUT CONTRAST  Technique:  Multiplanar, multiecho pulse sequences of the brain and surrounding structures were  obtained without intravenous contrast. Angiographic images of the head were obtained using MRA technique without contrast.  Comparison:  CT head without contrast from the same day.  MRI HEAD  Findings:  Multiple acute non hemorrhagic right PICA territory infarcts are scattered amount stool several old lacunar infarcts in the same territory.  No other vascular territory infarct is present.  Minimal supratentorial white matter disease is at the upper limits of normal for age.  No hemorrhage or mass lesion is present.  The ventricles are of normal size.  No significant extra- axial fluid collection is present.  T2 hyperintensity is associated with the acute right PICA territory lesions.  There is mild sulcal effacement without significant mass effect on the fourth ventricle or midline shift.  The vertebral arteries and basilar artery are small but patent. The anterior circulation is within normal limits.  The globes orbits are intact.  The paranasal sinuses and mastoid air cells are clear.  IMPRESSION:  1.  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 2.  Supratentorial subcortical white matter changes are at the upper limits of normal for age. 3.  No acute supratentorial abnormality.  MRA HEAD  Findings: The internal carotid arteries are within normal limits from high cervical segments through the ICA termini bilaterally. The A1 and M1 segments are normal.  The anterior communicating artery is patent.  ACA  and MCA branch vessels are within normal limits.  The vertebral arteries and basilar artery are hypoplastic.  The left PICA origin is visualized and within normal limits.  The right PICA is significantly more faint, suggesting a focal stenosis. Both posterior cerebral arteries are predominately of fetal type with small P1 segments, right greater than left.  The PCA branch vessels are within normal  limits.  IMPRESSION:  1.  Asymmetric signal intensity in the PICA artery suggests a stenosis on the right, corresponding to the infarcted territories. 2.  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   Original Report Authenticated By: Marin Roberts, M.D.     1230:  Pt not code stroke nor TPA candidate due to symptoms resolved on arrival to ED. Neuro Dr. Roseanne Reno has evaluated pt, requests to admit to medicine service. Pt has been ambulatory in the ED with steady gait, easy resps. Neuro exam unchanged. Dx and testing d/w pt.  Questions answered.  Verb understanding, agreeable to admit.   T/C to [facility] Dr. Benjamine Mola, case discussed, including:  HPI, pertinent PM/SHx, VS/PE, dx testing, ED course and treatment:  Agreeable to admit, requests to write temporary orders, obtain inpatient tele bed to team [id number].      Laray Anger, DO 12/24/12 2140

## 2012-12-22 NOTE — H&P (Signed)
[facility] History and Physical  KAYCI BELLEVILLE EAV:[id number] DOB: Dec 29, 1960 DOA: 12/22/2012  Referring physician: er PCP: Neldon Labella, MD  Specialists: neuro  Chief Complaint: vertigo and nausea  HPI: Katrina Liu is a 52 y.o. female  with a history of hypertension presenting with complaint of acute onset of vertigo and nausea with associated numbness involving the right side of her face starting at 7 AM this morning. She has no history of stroke nor TIA. She has not been on antiplatelet therapy. CT scan of her head showed no acute intracranial abnormality. 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 MRI of the brain done in the ER, showed acute as well as chronic non-hemorrhagic right PICA territory infarctions. 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 Symptoms have resolved: no Vertigo; no Numbness   Hospitalist were asked to admit for CVA work up   Review of Systems: all systems reviewed, negative unless stated above   Past Medical History  Diagnosis Date  . Headache(784.0)   . Anemia   . GERD (gastroesophageal reflux disease)     h/o - not now  . S/P abdominal hysterectomy 04/17/2011   Past Surgical History  Procedure Laterality Date  . Laparotomy      ectopic pregnancy  . Shoulder arthroscopy    . Kidney stone surgery    . Abdominal hysterectomy  04/16/2011    Procedure: HYSTERECTOMY ABDOMINAL;  Surgeon: Serita Kyle, MD;  Location: [facility];  Service: Gynecology;  Laterality: N/A;   Social History:  reports that she has never smoked. She does not have any smokeless tobacco history on file. She reports that she does not drink alcohol or use illicit drugs.  No Known Allergies  Family Hx: HTN  Prior to Admission medications   Medication Sig Start Date End Date Taking? Authorizing Provider  aspirin-acetaminophen-caffeine  (EXCEDRIN MIGRAINE) [phone] MG per tablet Take 1 tablet by mouth every 6 (six) hours as needed. For headaches    Yes Historical Provider, MD  hydrochlorothiazide (HYDRODIURIL) 25 MG tablet Take 25 mg by mouth daily.   Yes Historical Provider, MD  mometasone (ELOCON) 0.1 % ointment Apply 1 application topically daily.     Yes Historical Provider, MD  Multiple Vitamins-Minerals (MULTIVITAMIN WITH MINERALS) tablet Take 1 tablet by mouth daily. Hair, Skin and Nails   Yes Historical Provider, MD   Physical Exam: Filed Vitals:   12/22/12 1217  BP: 155/74  Pulse: 93  Temp:   Resp: 16     General:  Pleasant, cooperative  Eyes: wnl  ENT: wnl  Neck: supple  Cardiovascular: rrr  Respiratory: clear  Abdomen: +BS, soft, NT  Skin: no rashes or lesions  Musculoskeletal: moves all 4 ext  Psychiatric: normal  Neurologic: no focal deficit  Labs on Admission:  Basic Metabolic Panel:  Recent Labs Lab 12/22/12 1005  NA 141  K 3.5  CL 101  CO2 29  GLUCOSE 99  BUN 11  CREATININE 0.80  CALCIUM 10.0   Liver Function Tests:  Recent Labs Lab 12/22/12 1005  AST 15  ALT 11  ALKPHOS 61  BILITOT 0.5  PROT 7.3  ALBUMIN 4.0   No results found for this basename: LIPASE, AMYLASE,  in the last 168 hours No results found for this basename: AMMONIA,  in the last 168 hours CBC:  Recent Labs Lab 12/22/12 1005  WBC 6.0  NEUTROABS 4.6  HGB 14.1  HCT 39.6  MCV 87.8  PLT 226   Cardiac Enzymes:  Recent Labs Lab 12/22/12 1010  TROPONINI <0.30    BNP (last 3 results) No results found for this basename: PROBNP,  in the last 8760 hours CBG:  Recent Labs Lab 12/22/12 0919  GLUCAP 113*    Radiological Exams on Admission: Ct Head Wo Contrast  12/22/2012   *RADIOLOGY REPORT*  Clinical Data: Right-sided facial droop.  Slurred speech.  CT HEAD WITHOUT CONTRAST  Technique:  Contiguous axial images were obtained from the base of the skull through the vertex without  contrast.  Comparison: No priors.  Findings: Faint physiologic calcifications are noted within the basal ganglia bilaterally. Multiple well-defined areas of low attenuation in the right cerebellar hemisphere are compatible with old infarctions. No acute intracranial abnormalities. Specifically, no evidence of acute intracranial hemorrhage, no definite findings of acute/subacute cerebral ischemia, no mass, mass effect, hydrocephalus or abnormal intra or extra-axial fluid collections.  Visualized paranasal sinuses and mastoids are well pneumatized.  No acute displaced skull fractures are identified.  IMPRESSION: 1.  No acute intracranial abnormalities. 2.  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   Original Report Authenticated By: Trudie Reed, M.D.   Mr [facility] Wo Contrast  12/22/2012   *RADIOLOGY REPORT*  Clinical Data:  Dizziness.  MRI HEAD WITHOUT CONTRAST MRA HEAD WITHOUT CONTRAST  Technique:  Multiplanar, multiecho pulse sequences of the brain and surrounding structures were obtained without intravenous contrast. Angiographic images of the head were obtained using MRA technique without contrast.  Comparison:  CT head without contrast from the same day.  MRI HEAD  Findings:  Multiple acute non hemorrhagic right PICA territory infarcts are scattered amount stool several old lacunar infarcts in the same territory.  No other vascular territory infarct is present.  Minimal supratentorial white matter disease is at the upper limits of normal for age.  No hemorrhage or mass lesion is present.  The ventricles are of normal size.  No significant extra- axial fluid collection is present.  T2 hyperintensity is associated with the acute right PICA territory lesions.  There is mild sulcal effacement without significant mass effect on the fourth ventricle or midline shift.  The vertebral arteries and basilar artery are small but patent. The anterior circulation is within normal limits.  The  globes orbits are intact.  The paranasal sinuses and mastoid air cells are clear.  IMPRESSION:  1.  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 2.  Supratentorial subcortical white matter changes are at the upper limits of normal for age. 3.  No acute supratentorial abnormality.  MRA HEAD  Findings: The internal carotid arteries are within normal limits from high cervical segments through the ICA termini bilaterally. The A1 and M1 segments are normal.  The anterior communicating artery is patent.  ACA and MCA branch vessels are within normal limits.  The vertebral arteries and basilar artery are hypoplastic.  The left PICA origin is visualized and within normal limits.  The right PICA is significantly more faint, suggesting a focal stenosis. Both posterior cerebral arteries are predominately of fetal type with small P1 segments, right greater than left.  The PCA branch vessels are within normal limits.  IMPRESSION:  1.  Asymmetric signal intensity in the PICA artery suggests a stenosis on the right, corresponding to the infarcted territories. 2.  Hypoplastic vertebral arteries  and basilar artery with fetal type posterior cerebral arteries bilaterally.   Original Report Authenticated By: Marin Roberts, M.D.   Mr Brain Wo Contrast  12/22/2012   *RADIOLOGY REPORT*  Clinical Data:  Dizziness.  MRI HEAD WITHOUT CONTRAST MRA HEAD WITHOUT CONTRAST  Technique:  Multiplanar, multiecho pulse sequences of the brain and surrounding structures were obtained without intravenous contrast. Angiographic images of the head were obtained using MRA technique without contrast.  Comparison:  CT head without contrast from the same day.  MRI HEAD  Findings:  Multiple acute non hemorrhagic right PICA territory infarcts are scattered amount stool several old lacunar infarcts in the same territory.  No other  vascular territory infarct is present.  Minimal supratentorial white matter disease is at the upper limits of normal for age.  No hemorrhage or mass lesion is present.  The ventricles are of normal size.  No significant extra- axial fluid collection is present.  T2 hyperintensity is associated with the acute right PICA territory lesions.  There is mild sulcal effacement without significant mass effect on the fourth ventricle or midline shift.  The vertebral arteries and basilar artery are small but patent. The anterior circulation is within normal limits.  The globes orbits are intact.  The paranasal sinuses and mastoid air cells are clear.  IMPRESSION:  1.  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 2.  Supratentorial subcortical white matter changes are at the upper limits of normal for age. 3.  No acute supratentorial abnormality.  MRA HEAD  Findings: The internal carotid arteries are within normal limits from high cervical segments through the ICA termini bilaterally. The A1 and M1 segments are normal.  The anterior communicating artery is patent.  ACA and MCA branch vessels are within normal limits.  The vertebral arteries and basilar artery are hypoplastic.  The left PICA origin is visualized and within normal limits.  The right PICA is significantly more faint, suggesting a focal stenosis. Both posterior cerebral arteries are predominately of fetal type with small P1 segments, right greater than left.  The PCA branch vessels are within normal limits.  IMPRESSION:  1.  Asymmetric signal intensity in the PICA artery suggests a stenosis on the right, corresponding to the infarcted territories. 2.  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   Original Report Authenticated By: Marin Roberts, M.D.    EKG: Independently  reviewed. NSR  Assessment/Plan Active Problems:   Cerebellar stroke syndrome   Hypertension   CVA- MRI +; ordered HgbA1c, fasting lipid panel  PT consult, OT consult, Echocardiogram, Carotid dopplers. Aspirin 81 mg per day  Telemetry monitoring, neuro consult  HTN: allow permissive HTN for now  neuro  Code Status: full Family Communication: patient Disposition Plan: admit  Time spent: 70 min  VANN, JESSICA [facility] Pager [phone]  If 7PM-7AM, please contact night-coverage [email or web address] Password [email or web address] 12/22/2012, 1:14 PM

## 2012-12-23 ENCOUNTER — Encounter (HOSPITAL_COMMUNITY): Payer: Self-pay | Admitting: Nurse Practitioner

## 2012-12-23 DIAGNOSIS — Z90710 Acquired absence of both cervix and uterus: Secondary | ICD-10-CM

## 2012-12-23 LAB — RPR: RPR Ser Ql: NONREACTIVE

## 2012-12-23 LAB — LIPID PANEL: HDL: 72 mg/dL (ref >39)

## 2012-12-23 LAB — HIV ANTIBODY (ROUTINE TESTING W REFLEX): HIV: NONREACTIVE

## 2012-12-23 LAB — HEMOGLOBIN A1C
Hgb A1c MFr Bld: 5.6 % (ref <5.7)
Mean Plasma Glucose: 114 mg/dL (ref <117)

## 2012-12-23 NOTE — Progress Notes (Signed)
Stroke Team Progress Note  HISTORY Katrina Liu is an 52 y.o. female with a history of hypertension presenting with complaint of acute onset of vertigo and nausea with associated numbness involving the right side of her face starting at 7 AM this morning 12/22/2012. She has no history of stroke nor TIA. She has not been on antiplatelet therapy. CT scan of her head showed no acute intracranial abnormality. 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 MRI of the brain showed acute as well as chronic non-hemorrhagic right PICA territory infarctions. 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 Vertigo subsided for the most part. Numbness resolved. Patient has not experienced coordination difficulty. NIH stroke score was 0. Patient was not a TPA candidate secondary to rapid resolution of symptoms. She was admitted for further evaluation and treatment.  SUBJECTIVE No family is at the bedside.  Overall she feels her condition is rapidly improving with only minimal dizziness when OOB  OBJECTIVE Most recent Vital Signs: Filed Vitals:   12/22/12 2200 12/23/12 0000 12/23/12 0200 12/23/12 0600  BP: 160/91 130/83 123/76 150/82  Pulse: 72 71 72 72  Temp: 98 F (36.7 C) 97.8 F (36.6 C) 97.7 F (36.5 C) 97.9 F (36.6 C)  TempSrc:      Resp: 16 16 16 16   Height:      Weight:      SpO2: 100% 99% 99% 99%   CBG (last 3)   Recent Labs  12/22/12 0919  GLUCAP 113*    IV Fluid Intake:     MEDICATIONS  . aspirin EC  81 mg Oral Daily  . enoxaparin (LOVENOX) injection  40 mg Subcutaneous QHS  . hydrochlorothiazide  25 mg Oral Daily  . multivitamin with minerals  1 tablet Oral Daily   PRN:  acetaminophen, senna-docusate  Diet:  Cardiac thin liquids Activity:  OOB with assistance DVT Prophylaxis:  Lovenox 40 mg sq daily   CLINICALLY SIGNIFICANT STUDIES Basic Metabolic  Panel:  Recent Labs Lab 12/22/12 1005  NA 141  K 3.5  CL 101  CO2 29  GLUCOSE 99  BUN 11  CREATININE 0.80  CALCIUM 10.0   Liver Function Tests:  Recent Labs Lab 12/22/12 1005  AST 15  ALT 11  ALKPHOS 61  BILITOT 0.5  PROT 7.3  ALBUMIN 4.0   CBC:  Recent Labs Lab 12/22/12 1005  WBC 6.0  NEUTROABS 4.6  HGB 14.1  HCT 39.6  MCV 87.8  PLT 226   Coagulation:  Recent Labs Lab 12/22/12 1005  LABPROT 13.6  INR 1.06   Cardiac Enzymes:  Recent Labs Lab 12/22/12 1010  TROPONINI <0.30   Urinalysis:  Recent Labs Lab 12/22/12 1242  COLORURINE YELLOW  LABSPEC 1.008  PHURINE 8.0  GLUCOSEU NEGATIVE  HGBUR NEGATIVE  BILIRUBINUR NEGATIVE  KETONESUR NEGATIVE  PROTEINUR NEGATIVE  UROBILINOGEN 0.2  NITRITE NEGATIVE  LEUKOCYTESUR NEGATIVE   Lipid Panel    Component Value Date/Time   CHOL 179 12/23/2012 0500   TRIG 62 12/23/2012 0500   HDL 72 12/23/2012 0500   CHOLHDL 2.5 12/23/2012 0500   VLDL 12 12/23/2012 0500   LDLCALC 95 12/23/2012 0500   HgbA1C  No results found for this basename: HGBA1C    Urine Drug Screen:   No results found for this basename: labopia, cocainscrnur, labbenz, amphetmu, thcu, labbarb    Alcohol Level:  Recent Labs Lab 12/22/12 1005  ETH <  11    CT of the brain  12/22/2012    1.  No acute intracranial abnormalities. 2.  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  MRI of the brain  12/22/2012    1.  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 2.  Supratentorial subcortical white matter changes are at the upper limits of normal for age. 3.  No acute supratentorial abnormality.    MRA of the brain  12/22/2012    1.  Asymmetric signal intensity in the PICA artery suggests a stenosis on the right, corresponding to the infarcted territories. 2.  Hypoplastic vertebral arteries and basilar artery with  fetal type posterior cerebral arteries bilaterally.  2D Echocardiogram  EF 60-65% with no source of embolus.   Carotid Doppler  No evidence of hemodynamically significant internal carotid artery stenosis. Vertebral artery flow is antegrade.   CXR  12/22/2012    Normal examination.     EKG  normal sinus rhythm.   Therapy Recommendations   Physical Exam   Pleasant middle aged african american lady not in distress.Awake alert. Afebrile. Head is nontraumatic. Neck is supple without bruit. Hearing is normal. Cardiac exam no murmur or gallop. Lungs are clear to auscultation. Distal pulses are well felt. Neurological Exam :   Awake  Alert oriented x 3. Normal speech and language.eye movements full without nystagmus.fundi were not visualized. Vision acuity and fields appear normal. Hearing is normal. Palatal movements are normal. Face symmetric. Tongue midline. Normal strength, tone, reflexes and coordination. Normal sensation. Gait deferred. ASSESSMENT Katrina Liu is a 52 y.o. female presenting with acute onset of vertigo and nausea with associated right facial numbness.  Imaging confirms a right PICA infarct in setting of old right cerebellar infarcts. Etiology of Infarcts unknown  embolic source. On no antithrombotics prior to admission. Now on aspirin 81 mg orally every day for secondary stroke prevention. Patient with resultant left facial weakness, occasional dizziness. Work up underway.  Hypertension Hx migraines Family history of stroke (dad at age 9) LDL 95; HgbA1c pending CT shows old right cerebellar hemisphere infarcts, no known hx stroke per pt, pt remembers an episode of dizziness Oct/Nov 2013 which is likely associated  Hospital day # 1  TREATMENT/PLAN  Continue aspirin 81 mg orally every day for secondary stroke prevention. TEE to look for embolic source. Arranged with [facility] for tomorrow.  If positive for PFO (patent foramen ovale), check bilateral lower  extremity venous dopplers to rule out DVT as possible source of stroke. If TEE negative, [place] cardiologist will place implantable loop recorder to evaluate for atrial fibrillation as etiology of stroke. This has been explained to patient by Dr. Pearlean Brownie and she is are agreeable. Check  Hypercoagulable panel (minus factor 5 leiden and beta-2-glycoprotein as these test for venous clots) and vasculitic labs (C3, C4, CH50, ANA, ESR) and RPR & HIV Therapy evals  Annie Main, MSN, RN, ANVP-BC, ANP-BC, GNP-BC [facility] Pager: [phone] 12/23/2012 10:06 AM  I have personally obtained a history, examined the patient, evaluated imaging results, and formulated the assessment and plan of care. I agree with the above. Delia Heady, MD

## 2012-12-23 NOTE — Progress Notes (Signed)
[facility] PROGRESS NOTE  Katrina Liu XBJ:[id number] DOB: 05/19/1960 DOA: 12/22/2012 PCP: Neldon Labella, MD  Assessment/Plan: 1. PCA infarct: aspirin 81 mg  TEE to look for embolic source. Arranged with [facility] for 8/27. If positive for PFO (patent foramen ovale), check bilateral lower extremity venous dopplers to rule out DVT as possible source of stroke.  If TEE negative, [place] cardiologist will place implantable loop recorder to evaluate for atrial fibrillation as etiology of stroke. This has been explained to patient by Dr. Pearlean Brownie and she is are agreeable.  checked Hypercoagulable panel (minus factor 5 leiden and beta-2-glycoprotein as these test for venous clots) and vasculitic labs (C3, C4, CH50, ANA, ESR) and RPR & HIV  PT/OT   Code Status: full Family Communication: patient Disposition Plan: home soon   Consultants:  neuro  Procedures:  echo  Antibiotics:    HPI/Subjective: Still having intermittent dizziness  Objective: Filed Vitals:   12/23/12 1000  BP: 153/97  Pulse: 80  Temp: 98.1 F (36.7 C)  Resp: 18   No intake or output data in the 24 hours ending 12/23/12 1311 Filed Weights   12/22/12 1500  Weight: 63.957 kg (141 lb)    Exam:   General:  A+Ox3, NAd  Cardiovascular: rrr  Respiratory: clear anterior  Abdomen: +BS, soft  Musculoskeletal: moves all 4 ext  Data Reviewed: Basic Metabolic Panel:  Recent Labs Lab 12/22/12 1005  NA 141  K 3.5  CL 101  CO2 29  GLUCOSE 99  BUN 11  CREATININE 0.80  CALCIUM 10.0   Liver Function Tests:  Recent Labs Lab 12/22/12 1005  AST 15  ALT 11  ALKPHOS 61  BILITOT 0.5  PROT 7.3  ALBUMIN 4.0   No results found for this basename: LIPASE, AMYLASE,  in the last 168 hours No results found for this basename: AMMONIA,  in the last 168 hours CBC:  Recent Labs Lab 12/22/12 1005  WBC 6.0  NEUTROABS 4.6  HGB 14.1  HCT 39.6  MCV 87.8  PLT 226   Cardiac  Enzymes:  Recent Labs Lab 12/22/12 1010  TROPONINI <0.30   BNP (last 3 results) No results found for this basename: PROBNP,  in the last 8760 hours CBG:  Recent Labs Lab 12/22/12 0919  GLUCAP 113*    No results found for this or any previous visit (from the past 240 hour(s)).   Studies: Dg Chest 2 View  12/22/2012   CLINICAL DATA:  Right facial numbness. Dizziness.  EXAM: CHEST  2 VIEW  COMPARISON:  None.  FINDINGS: The heart size and mediastinal contours are within normal limits. Both lungs are clear. The visualized skeletal structures are unremarkable.  IMPRESSION: Normal examination.   Electronically Signed   By: Gordan Payment   On: 12/22/2012 14:29   Ct Head Wo Contrast  12/22/2012   *RADIOLOGY REPORT*  Clinical Data: Right-sided facial droop.  Slurred speech.  CT HEAD WITHOUT CONTRAST  Technique:  Contiguous axial images were obtained from the base of the skull through the vertex without contrast.  Comparison: No priors.  Findings: Faint physiologic calcifications are noted within the basal ganglia bilaterally. Multiple well-defined areas of low attenuation in the right cerebellar hemisphere are compatible with old infarctions. No acute intracranial abnormalities. Specifically, no evidence of acute intracranial hemorrhage, no definite findings of acute/subacute cerebral ischemia, no mass, mass effect, hydrocephalus or abnormal intra or extra-axial fluid collections.  Visualized paranasal sinuses and mastoids are well pneumatized.  No acute displaced skull  fractures are identified.  IMPRESSION: 1.  No acute intracranial abnormalities. 2.  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   Original Report Authenticated By: Trudie Reed, M.D.   Mr [facility] Wo Contrast  12/22/2012   *RADIOLOGY REPORT*  Clinical Data:  Dizziness.  MRI HEAD WITHOUT CONTRAST MRA HEAD WITHOUT CONTRAST  Technique:  Multiplanar, multiecho pulse sequences of the brain and surrounding structures  were obtained without intravenous contrast. Angiographic images of the head were obtained using MRA technique without contrast.  Comparison:  CT head without contrast from the same day.  MRI HEAD  Findings:  Multiple acute non hemorrhagic right PICA territory infarcts are scattered amount stool several old lacunar infarcts in the same territory.  No other vascular territory infarct is present.  Minimal supratentorial white matter disease is at the upper limits of normal for age.  No hemorrhage or mass lesion is present.  The ventricles are of normal size.  No significant extra- axial fluid collection is present.  T2 hyperintensity is associated with the acute right PICA territory lesions.  There is mild sulcal effacement without significant mass effect on the fourth ventricle or midline shift.  The vertebral arteries and basilar artery are small but patent. The anterior circulation is within normal limits.  The globes orbits are intact.  The paranasal sinuses and mastoid air cells are clear.  IMPRESSION:  1.  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 2.  Supratentorial subcortical white matter changes are at the upper limits of normal for age. 3.  No acute supratentorial abnormality.  MRA HEAD  Findings: The internal carotid arteries are within normal limits from high cervical segments through the ICA termini bilaterally. The A1 and M1 segments are normal.  The anterior communicating artery is patent.  ACA and MCA branch vessels are within normal limits.  The vertebral arteries and basilar artery are hypoplastic.  The left PICA origin is visualized and within normal limits.  The right PICA is significantly more faint, suggesting a focal stenosis. Both posterior cerebral arteries are predominately of fetal type with small P1 segments, right greater than left.  The PCA branch vessels are within  normal limits.  IMPRESSION:  1.  Asymmetric signal intensity in the PICA artery suggests a stenosis on the right, corresponding to the infarcted territories. 2.  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   Original Report Authenticated By: Marin Roberts, M.D.   Mr Brain Wo Contrast  12/22/2012   *RADIOLOGY REPORT*  Clinical Data:  Dizziness.  MRI HEAD WITHOUT CONTRAST MRA HEAD WITHOUT CONTRAST  Technique:  Multiplanar, multiecho pulse sequences of the brain and surrounding structures were obtained without intravenous contrast. Angiographic images of the head were obtained using MRA technique without contrast.  Comparison:  CT head without contrast from the same day.  MRI HEAD  Findings:  Multiple acute non hemorrhagic right PICA territory infarcts are scattered amount stool several old lacunar infarcts in the same territory.  No other vascular territory infarct is present.  Minimal supratentorial white matter disease is at the upper limits of normal for age.  No hemorrhage or mass lesion is present.  The ventricles are of normal size.  No significant extra- axial fluid collection is present.  T2 hyperintensity is associated with the acute right PICA territory lesions.  There is mild sulcal effacement without significant mass  effect on the fourth ventricle or midline shift.  The vertebral arteries and basilar artery are small but patent. The anterior circulation is within normal limits.  The globes orbits are intact.  The paranasal sinuses and mastoid air cells are clear.  IMPRESSION:  1.  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 2.  Supratentorial subcortical white matter changes are at the upper limits of normal for age. 3.  No acute supratentorial abnormality.  MRA HEAD  Findings: The internal carotid arteries are  within normal limits from high cervical segments through the ICA termini bilaterally. The A1 and M1 segments are normal.  The anterior communicating artery is patent.  ACA and MCA branch vessels are within normal limits.  The vertebral arteries and basilar artery are hypoplastic.  The left PICA origin is visualized and within normal limits.  The right PICA is significantly more faint, suggesting a focal stenosis. Both posterior cerebral arteries are predominately of fetal type with small P1 segments, right greater than left.  The PCA branch vessels are within normal limits.  IMPRESSION:  1.  Asymmetric signal intensity in the PICA artery suggests a stenosis on the right, corresponding to the infarcted territories. 2.  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   Original Report Authenticated By: Marin Roberts, M.D.    Scheduled Meds: . aspirin EC  81 mg Oral Daily  . enoxaparin (LOVENOX) injection  40 mg Subcutaneous QHS  . hydrochlorothiazide  25 mg Oral Daily  . multivitamin with minerals  1 tablet Oral Daily   Continuous Infusions:   Active Problems:   Cerebellar stroke syndrome   Hypertension    Time spent: 35    [facility], Rashaad Hallstrom  [facility] Pager [phone]. If 7PM-7AM, please contact night-coverage at [email or web address], password [facility] 12/23/2012, 1:11 PM  LOS: 1 day

## 2012-12-23 NOTE — Progress Notes (Signed)
Brief Nutrition Note:   RD pulled to pt for malnutrition screening tool report of unintentional weight loss and poor oral intake.   Discussed with pt. Pt denies any unintentional weight loss and appetite has been normal. Pt denies any nutrition needs at this time.  Body mass index is 23.46 kg/(m^2). WNL Diet: NPO   Chart Reviewed, no nutrition interventions warranted at this time. Please consult as needed.   Clarene Duke RD, LDN Pager [phone] After Hours pager [phone]

## 2012-12-23 NOTE — Evaluation (Signed)
Physical Therapy Evaluation Patient Details Name: Katrina Liu MRN: [id number] DOB: [birth year] Today's Date: 12/23/2012 Time: 5621-3086 PT Time Calculation (min): 26 min  PT Assessment / Plan / Recommendation History of Present Illness  Katrina Liu is a 52 y.o. female  Admitted with vertigo and nausea which resolved on arrival to ED.  MRI shows infarct in R PCA territory.   Clinical Impression  Patient evaluated by Physical Therapy with no further acute PT needs identified. All education has been completed and the patient has no further questions.  See below for any follow-up Physial Therapy or equipment needs. PT is signing off. Thank you for this referral.     PT Assessment  Patent does not need any further PT services    Follow Up Recommendations  No PT follow up    Does the patient have the potential to tolerate intense rehabilitation      Barriers to Discharge        Equipment Recommendations  None recommended by PT    Recommendations for Other Services     Frequency      Precautions / Restrictions     Pertinent Vitals/Pain       Mobility  Bed Mobility Bed Mobility: Supine to Sit;Sitting - Scoot to Edge of Bed;Sit to Supine Supine to Sit: 7: Independent Sitting - Scoot to Edge of Bed: 7: Independent Sit to Supine: 7: Independent Transfers Transfers: Sit to Stand;Stand to Sit Sit to Stand: 7: Independent Stand to Sit: 7: Independent Ambulation/Gait Ambulation/Gait Assistance: 7: Independent Ambulation Distance (Feet): 400 Feet Assistive device: None Ambulation/Gait Assistance Details: safe and steady age approp., DGI =23 Gait Pattern: Within Functional Limits Gait velocity: Could easily cross streets against traffic Stairs: Yes Stairs Assistance: 7: Independent Stair Management Technique: No rails;Alternating pattern;Forwards Number of Stairs: 10 Wheelchair Mobility Wheelchair Mobility: No Modified Rankin (Stroke Patients Only) Pre-Morbid  Rankin Score: No symptoms Modified Rankin: No symptoms    Exercises     PT Diagnosis:    PT Problem List:   PT Treatment Interventions:       PT Goals(Current goals can be found in the care plan section)    Visit Information  Last PT Received On: 12/23/12 Assistance Needed: +1 History of Present Illness: Katrina Liu is a 52 y.o. female         Prior Functioning  Home Living Family/patient expects to be discharged to:: Private residence Living Arrangements: Alone Available Help at Discharge: Family;Friend(s);Available PRN/intermittently Type of Home: Apartment Home Access: Stairs to enter Entrance Stairs-Number of Steps: 14 Entrance Stairs-Rails: Left;Right Home Layout: One level Home Equipment: None Prior Function Level of Independence: Independent Communication Communication: No difficulties Dominant Hand: Left    Cognition  Cognition Arousal/Alertness: Awake/alert Behavior During Therapy: WFL for tasks assessed/performed Overall Cognitive Status: Within Functional Limits for tasks assessed    Extremity/Trunk Assessment Upper Extremity Assessment Upper Extremity Assessment: Overall WFL for tasks assessed Lower Extremity Assessment Lower Extremity Assessment: Overall WFL for tasks assessed   Balance Standardized Balance Assessment Standardized Balance Assessment: Dynamic Gait Index Dynamic Gait Index Level Surface: Normal Change in Gait Speed: Normal Gait with Horizontal Head Turns: Normal Gait with Vertical Head Turns: Normal Gait and Pivot Turn: Normal Step Over Obstacle: Normal Step Around Obstacles: Normal Steps: Normal Total Score: 24 High Level Balance High Level Balance Activites: Backward walking;Direction changes;Turns;Sudden stops;Head turns High Level Balance Comments: no LOB  End of Session PT - End of Session Activity Tolerance: Patient tolerated treatment well Patient  left: in bed;with call bell/phone within reach Nurse Communication:  Mobility status  GP     Azzam Mehra, Eliseo Gum 12/23/2012, 5:07 PM 12/23/2012  Orleans Bing, PT [phone]  (pager)

## 2012-12-24 ENCOUNTER — Encounter (HOSPITAL_COMMUNITY)
Admission: EM | Disposition: A | Discharge status: Home / Self Care | Payer: Self-pay | Source: Home / Self Care | Attending: Internal Medicine

## 2012-12-24 ENCOUNTER — Encounter (HOSPITAL_COMMUNITY): Payer: Self-pay | Admitting: Gastroenterology

## 2012-12-24 DIAGNOSIS — G43909 Migraine, unspecified, not intractable, without status migrainosus: Secondary | ICD-10-CM

## 2012-12-24 DIAGNOSIS — I6350 Cerebral infarction due to unspecified occlusion or stenosis of unspecified cerebral artery: Secondary | ICD-10-CM

## 2012-12-24 HISTORY — PX: LOOP RECORDER IMPLANT: SHX5477

## 2012-12-24 HISTORY — PX: TEE WITHOUT CARDIOVERSION: SHX5443

## 2012-12-24 LAB — C4 COMPLEMENT: Complement C4, Body Fluid: 21 mg/dL (ref 10–40)

## 2012-12-24 LAB — PROTHROMBIN GENE MUTATION

## 2012-12-24 LAB — LUPUS ANTICOAGULANT PANEL: PTT Lupus Anticoagulant: 31.2 secs (ref 28.0–43.0)

## 2012-12-24 LAB — PROTEIN S, TOTAL: Protein S Ag, Total: 70 % (ref 60–150)

## 2012-12-24 LAB — CARDIOLIPIN ANTIBODIES, IGG, IGM, IGA
Anticardiolipin IgG: 7 GPL U/mL — ABNORMAL LOW (ref <23)
Anticardiolipin IgM: 1 MPL U/mL — ABNORMAL LOW (ref <11)

## 2012-12-24 LAB — PROTEIN C, TOTAL: Protein C, Total: 99 % (ref 72–160)

## 2012-12-24 LAB — PROTEIN S ACTIVITY: Protein S Activity: 62 % — ABNORMAL LOW (ref 69–129)

## 2012-12-24 LAB — ANA: Anti Nuclear Antibody(ANA): NEGATIVE

## 2012-12-24 LAB — C3 COMPLEMENT: C3 Complement: 124 mg/dL (ref 90–180)

## 2012-12-24 SURGERY — ECHOCARDIOGRAM, TRANSESOPHAGEAL
Anesthesia: Moderate Sedation

## 2012-12-24 SURGERY — LOOP RECORDER IMPLANT
Anesthesia: LOCAL

## 2012-12-24 MED ORDER — FENTANYL CITRATE 0.05 MG/ML IJ SOLN
INTRAMUSCULAR | Status: DC | PRN
  Administered 2012-12-24: 25 ug via INTRAVENOUS

## 2012-12-24 MED ORDER — LIDOCAINE-EPINEPHRINE 1 %-1:100000 IJ SOLN
INTRAMUSCULAR | Status: AC
  Filled 2012-12-24: qty 1

## 2012-12-24 MED ORDER — MIDAZOLAM HCL 10 MG/2ML IJ SOLN
INTRAMUSCULAR | Status: DC | PRN
  Administered 2012-12-24 (×3): 2 mg via INTRAVENOUS

## 2012-12-24 MED ORDER — SODIUM CHLORIDE 0.9 % IV SOLN
INTRAVENOUS | Status: DC
  Administered 2012-12-24: 500 mL via INTRAVENOUS

## 2012-12-24 MED ORDER — MIDAZOLAM HCL 5 MG/ML IJ SOLN
INTRAMUSCULAR | Status: AC
  Filled 2012-12-24: qty 3

## 2012-12-24 MED ORDER — CEFAZOLIN SODIUM-DEXTROSE 2-3 GM-% IV SOLR: 2.0000 g | INTRAVENOUS | Status: DC

## 2012-12-24 MED ORDER — LIDOCAINE VISCOUS 2 % MT SOLN
OROMUCOSAL | Status: AC
  Filled 2012-12-24: qty 15

## 2012-12-24 MED ORDER — LIDOCAINE VISCOUS 2 % MT SOLN
OROMUCOSAL | Status: DC | PRN
  Administered 2012-12-24: 15 mL via OROMUCOSAL

## 2012-12-24 MED ORDER — CHLORHEXIDINE GLUCONATE 4 % EX LIQD
60.0000 mL | Freq: Once | CUTANEOUS | Status: DC
  Filled 2012-12-24: qty 60

## 2012-12-24 MED ORDER — FENTANYL CITRATE 0.05 MG/ML IJ SOLN
INTRAMUSCULAR | Status: AC
  Filled 2012-12-24: qty 4

## 2012-12-24 NOTE — Consult Note (Signed)
Reviewed pt examined  and agree With plan

## 2012-12-24 NOTE — CV Procedure (Signed)
Pre op Dx  Cryptogenic stroke Post op Dx  same  Procedure  Loop Recorder implantation  After routine prep and drape of the left parasternal area, a small incision was created. A [employer] LINQ Reveal Loop Recorder  Serial Number  [id number] was inserted.    dermabond was   applied.  The patient tolerated the procedure without apparent complication. A little bleeding occurred which i stopped with cautery

## 2012-12-24 NOTE — CV Procedure (Signed)
TEE performed without complication.  Normal LV function.  Mild MR.  Mild TR. Normal aortic valve.  No AI.  No LA/LAA thrombus.  Mild, focal area of aortic arch plaque.  No interatrial communication by saline contrast study.

## 2012-12-24 NOTE — Interval H&P Note (Signed)
History and Physical Interval Note:  12/24/2012 5:01 PM  Katrina Liu  has presented today for surgery, with the diagnosis of Syncope  The various methods of treatment have been discussed with the patient and family. After consideration of risks, benefits and other options for treatment, the patient has consented to  Procedure(s): LOOP RECORDER IMPLANT (N/A) as a surgical intervention .  The patient's history has been reviewed, patient examined, no change in status, stable for surgery.  I have reviewed the patient's chart and labs.  Questions were answered to the patient's satisfaction.     Sherryl Manges

## 2012-12-24 NOTE — Progress Notes (Signed)
  Echocardiogram Echocardiogram Transesophageal has been performed.  Early Ord 12/24/2012, 2:50 PM

## 2012-12-24 NOTE — Progress Notes (Signed)
SLP Cancellation Note  Patient Details Name: Katrina Liu MRN: [id number] DOB: 1961/02/20   Cancelled treatment:       Reason Eval/Treat Not Completed: Patient at procedure or test/unavailable. Will continue to make attempts to see patient for speech-language evaluation.   Maxcine Ham 12/24/2012, 1:25 PM  Maxcine Ham, M.A. CCC-SLP

## 2012-12-24 NOTE — H&P (Signed)
Date of Initial H&P: 12/24/12  History reviewed, patient examined, no change in status, stable for TEE, due to stroke.  All questions answered.

## 2012-12-24 NOTE — Consult Note (Signed)
ELECTROPHYSIOLOGY CONSULT NOTE  Patient ID: Katrina Liu MRN: [id number], DOB/AGE: 04/30/1961   Admit date: 12/22/2012 Date of Consult: 12/24/2012  Primary Physician: Neldon Labella, MD Primary Cardiologist: None Reason for Consultation: Cryptogenic stroke; recommendations regarding Implantable Loop Recorder  History of Present Illness Katrina Liu was admitted on 12/22/2012 with acute right PICA CVA. She has been monitored on telemetry which has demonstrated no arrhythmias. No cause has been identified. Inpatient stroke work-up is to be completed with a TEE today. EP has been asked to evaluate for placement of an implantable loop recorder to monitor for atrial fibrillation.  Past Medical History Past Medical History  Diagnosis Date  . Headache(784.0)   . Anemia   . GERD (gastroesophageal reflux disease)     h/o - not now  . S/P abdominal hysterectomy 04/17/2011    Past Surgical History Past Surgical History  Procedure Laterality Date  . Laparotomy      ectopic pregnancy  . Shoulder arthroscopy    . Kidney stone surgery    . Abdominal hysterectomy  04/16/2011    Procedure: HYSTERECTOMY ABDOMINAL;  Surgeon: Serita Kyle, MD;  Location: [facility];  Service: Gynecology;  Laterality: N/A;    Allergies/Intolerances No Known Allergies  Inpatient Medications . aspirin EC  81 mg Oral Daily  . enoxaparin (LOVENOX) injection  40 mg Subcutaneous QHS  . hydrochlorothiazide  25 mg Oral Daily  . multivitamin with minerals  1 tablet Oral Daily   Social History Social History  . Marital Status: Divorced   Social History Main Topics  . Smoking status: Never Smoker   . Smokeless tobacco: No  . Alcohol Use: No  . Drug Use: No   Review of Systems General: No chills, fever, night sweats or weight changes  Cardiovascular:  No chest pain, dyspnea on exertion, edema, orthopnea, palpitations, paroxysmal nocturnal dyspnea Dermatological: No rash, lesions or masses Respiratory:  No cough, dyspnea Urologic: No hematuria, dysuria Abdominal: No nausea, vomiting, diarrhea, bright red blood per rectum, melena, or hematemesis Neurologic: No visual changes, weakness, changes in mental status All other systems reviewed and are otherwise negative except as noted above.  Physical Exam Blood pressure 114/77, pulse 61, temperature 97.7 F (36.5 C), temperature source Oral, resp. rate 16, height 5\' 5"  (1.651 m), weight 141 lb (63.957 kg), last menstrual period 03/17/2011, SpO2 100.00%.  General: Well developed, well appearing 52 y.o. female in no acute distress. HEENT: Normocephalic, atraumatic. EOMs intact. Sclera nonicteric. Oropharynx clear.  Neck: Supple. No JVD. Lungs: Respirations regular and unlabored, CTA bilaterally. No wheezes, rales or rhonchi. Heart: RRR. S1, S2 present. No murmurs, rub, S3 or S4. Abdomen: Soft, non-distended.  Extremities: No clubbing, cyanosis or edema. DP/PT/Radials 2+ and equal bilaterally. Psych: Normal affect. Neuro: Alert and oriented X 3. Moves all extremities spontaneously. Musculoskeletal: No kyphosis. Skin: Intact. Warm and dry. No rashes or petechiae in exposed areas.   Labs Lab Results  Component Value Date   WBC 6.0 12/22/2012   HGB 14.1 12/22/2012   HCT 39.6 12/22/2012   MCV 87.8 12/22/2012   PLT 226 12/22/2012    Recent Labs Lab 12/22/12 1005  NA 141  K 3.5  CL 101  CO2 29  BUN 11  CREATININE 0.80  CALCIUM 10.0  PROT 7.3  BILITOT 0.5  ALKPHOS 61  ALT 11  AST 15  GLUCOSE 99    Recent Labs  12/22/12 1005  INR 1.06    Radiology/Studies Dg Chest 2 View 12/22/2012   CLINICAL  DATA:  Right facial numbness. Dizziness.  EXAM: CHEST  2 VIEW  COMPARISON:  None.  FINDINGS: The heart size and mediastinal contours are within normal limits. Both lungs are clear. The visualized skeletal structures are unremarkable.  IMPRESSION: Normal examination.   Electronically Signed   By: Gordan Payment   On: 12/22/2012 14:29   Mr Brain  Wo Contrast 12/22/2012   *RADIOLOGY REPORT*  Clinical Data:  Dizziness.  MRI HEAD WITHOUT CONTRAST MRA HEAD WITHOUT CONTRAST  Technique:  Multiplanar, multiecho pulse sequences of the brain and surrounding structures were obtained without intravenous contrast. Angiographic images of the head were obtained using MRA technique without contrast.  Comparison:  CT head without contrast from the same day.  MRI HEAD  Findings:  Multiple acute non hemorrhagic right PICA territory infarcts are scattered amount stool several old lacunar infarcts in the same territory.  No other vascular territory infarct is present.  Minimal supratentorial white matter disease is at the upper limits of normal for age.  No hemorrhage or mass lesion is present.  The ventricles are of normal size.  No significant extra- axial fluid collection is present.  T2 hyperintensity is associated with the acute right PICA territory lesions.  There is mild sulcal effacement without significant mass effect on the fourth ventricle or midline shift.  The vertebral arteries and basilar artery are small but patent. The anterior circulation is within normal limits.  The globes orbits are intact.  The paranasal sinuses and mastoid air cells are clear.  IMPRESSION:  1.  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 2.  Supratentorial subcortical white matter changes are at the upper limits of normal for age. 3.  No acute supratentorial abnormality.  MRA HEAD  Findings: The internal carotid arteries are within normal limits from high cervical segments through the ICA termini bilaterally. The A1 and M1 segments are normal.  The anterior communicating artery is patent.  ACA and MCA branch vessels are within normal limits.  The vertebral arteries and basilar artery are hypoplastic.  The left PICA origin is visualized and within normal limits.  The  right PICA is significantly more faint, suggesting a focal stenosis. Both posterior cerebral arteries are predominately of fetal type with small P1 segments, right greater than left.  The PCA branch vessels are within normal limits.  IMPRESSION:  1.  Asymmetric signal intensity in the PICA artery suggests a stenosis on the right, corresponding to the infarcted territories. 2.  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   Original Report Authenticated By: Marin Roberts, M.D.    Echocardiogram  Study Conclusions Left ventricle: The cavity size was normal. Systolic function was normal. The estimated ejection fraction was in the range of 60% to 65%. Wall motion was normal; there were no regional wall motion abnormalities. Doppler parameters are consistent with abnormal left ventricular relaxation (grade 1 diastolic dysfunction). Impression: No cardiac source of emboli was indentified.   12-lead ECG on admission - SR with normal intervals at 78 bpm Telemetry shows SR; no arrhythmias   Assessment and Plan 1. Cryptogenic stroke  If the TEE is negative, we recommend loop recorder insertion to monitor for AF. The indication for loop recorder insertion / monitoring for AF in setting of cryptogenic stroke was discussed with the patient. The loop recorder insertion procedure was reviewed in detail including risks and  benefits. These risks include but are not limited to bleeding and infection. Ms. Jaquita Rector expressed verbal understanding and agrees to proceed. She was also counseled regarding wound care and device follow-up.  Dr. Graciela Husbands to see Signed, Rick Duff, PA-C 12/24/2012, 9:11 AM

## 2012-12-24 NOTE — Progress Notes (Signed)
Pt came back around 5:45 pm after insertion of loop recorder. Informed the attending physician, pt will be going home tomorrow morning.

## 2012-12-24 NOTE — Progress Notes (Signed)
Stroke Team Progress Note  HISTORY Katrina Liu is an 52 y.o. female with a history of hypertension presenting with complaint of acute onset of vertigo and nausea with associated numbness involving the right side of her face starting at 7 AM this morning 12/22/2012. She has no history of stroke nor TIA. She has not been on antiplatelet therapy. CT scan of her head showed no acute intracranial abnormality. 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 MRI of the brain showed acute as well as chronic non-hemorrhagic right PICA territory infarctions. 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 Vertigo subsided for the most part. Numbness resolved. Patient has not experienced coordination difficulty. NIH stroke score was 0. Patient was not a TPA candidate secondary to rapid resolution of symptoms. She was admitted for further evaluation and treatment.  SUBJECTIVE No complaints. Awaiting test at 2p.  OBJECTIVE Most recent Vital Signs: Filed Vitals:   12/23/12 1800 12/23/12 2120 12/24/12 0144 12/24/12 0609  BP: 145/99 147/84 115/73 114/77  Pulse: 84 70 63 61  Temp: 98.4 F (36.9 C) 97.8 F (36.6 C) 98 F (36.7 C) 97.7 F (36.5 C)  TempSrc: Oral Oral Oral Oral  Resp: 20 16 16 16   Height:      Weight:      SpO2: 98% 100% 100% 100%   CBG (last 3)   Recent Labs  12/22/12 0919  GLUCAP 113*    IV Fluid Intake:     MEDICATIONS  . aspirin EC  81 mg Oral Daily  . enoxaparin (LOVENOX) injection  40 mg Subcutaneous QHS  . hydrochlorothiazide  25 mg Oral Daily  . multivitamin with minerals  1 tablet Oral Daily   PRN:  acetaminophen, senna-docusate  Diet:  NPO thin liquids Activity:  OOB with assistance DVT Prophylaxis:  Lovenox 40 mg sq daily   CLINICALLY SIGNIFICANT STUDIES Basic Metabolic Panel:   Recent Labs Lab 12/22/12 1005  NA 141  K 3.5  CL 101  CO2  29  GLUCOSE 99  BUN 11  CREATININE 0.80  CALCIUM 10.0   Liver Function Tests:   Recent Labs Lab 12/22/12 1005  AST 15  ALT 11  ALKPHOS 61  BILITOT 0.5  PROT 7.3  ALBUMIN 4.0   CBC:   Recent Labs Lab 12/22/12 1005  WBC 6.0  NEUTROABS 4.6  HGB 14.1  HCT 39.6  MCV 87.8  PLT 226   Coagulation:   Recent Labs Lab 12/22/12 1005  LABPROT 13.6  INR 1.06   Cardiac Enzymes:   Recent Labs Lab 12/22/12 1010  TROPONINI <0.30   Urinalysis:   Recent Labs Lab 12/22/12 1242  COLORURINE YELLOW  LABSPEC 1.008  PHURINE 8.0  GLUCOSEU NEGATIVE  HGBUR NEGATIVE  BILIRUBINUR NEGATIVE  KETONESUR NEGATIVE  PROTEINUR NEGATIVE  UROBILINOGEN 0.2  NITRITE NEGATIVE  LEUKOCYTESUR NEGATIVE   Lipid Panel    Component Value Date/Time   CHOL 179 12/23/2012 0500   TRIG 62 12/23/2012 0500   HDL 72 12/23/2012 0500   CHOLHDL 2.5 12/23/2012 0500   VLDL 12 12/23/2012 0500   LDLCALC 95 12/23/2012 0500   HgbA1C  Lab Results  Component Value Date   HGBA1C 5.6 12/23/2012    Urine Drug Screen:   No results found for this basename: labopia,  cocainscrnur,  labbenz,  amphetmu,  thcu,  labbarb    Alcohol Level:   Recent Labs Lab 12/22/12 1005  ETH <11   Hypercoagulable Workup Normal - C3, C4, RPR, ESR, HIV, Anti III, homocysteine, ANA, HIV Pending - CH50,  Prot C activity, Protein C total, Protein S activity, Protein S total, lupus anticoagulant, cardiolipin antibody  CT of the brain  12/22/2012    1.  No acute intracranial abnormalities. 2.  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  MRI of the brain  12/22/2012    1.  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 2.  Supratentorial subcortical white matter changes are at the upper limits of normal for age. 3.  No acute supratentorial abnormality.    MRA of the brain  12/22/2012    1.   Asymmetric signal intensity in the PICA artery suggests a stenosis on the right, corresponding to the infarcted territories. 2.  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  2D Echocardiogram  EF 60-65% with no source of embolus.   Carotid Doppler  No evidence of hemodynamically significant internal carotid artery stenosis. Vertebral artery flow is antegrade.   TEE  CXR  12/22/2012    Normal examination.     EKG  normal sinus rhythm.   Therapy Recommendations no PT, no OT needs  Physical Exam   Pleasant middle aged african american lady not in distress.Awake alert. Afebrile. Head is nontraumatic. Neck is supple without bruit. Hearing is normal. Cardiac exam no murmur or gallop. Lungs are clear to auscultation. Distal pulses are well felt. Neurological Exam :   Awake  Alert oriented x 3. Normal speech and language.eye movements full without nystagmus.fundi were not visualized. Vision acuity and fields appear normal. Hearing is normal. Palatal movements are normal. Face symmetric. Tongue midline. Normal strength, tone, reflexes and coordination. Normal sensation. Gait deferred.  ASSESSMENT Katrina Liu is a 52 y.o. female presenting with acute onset of vertigo and nausea with associated right facial numbness.  Imaging confirms a right PICA infarct in setting of old right cerebellar infarcts. Etiology of Infarcts unknown  embolic source. On no antithrombotics prior to admission. Now on aspirin 81 mg orally every day for secondary stroke prevention. Patient with resultant left facial weakness, occasional dizziness. Work up underway.  Hypertension Hx migraines Family history of stroke (dad at age 63) LDL 95; HgbA1c 5.6 CT shows old right cerebellar hemisphere infarcts, no known hx stroke per pt, pt remembers an episode of dizziness Oct/Nov 2013 which is likely associated  Hospital day # 2  TREATMENT/PLAN  Continue  aspirin 81 mg orally every day for secondary stroke prevention. TEE to look for embolic source. Arranged with [facility] for today.  If positive for PFO (patent foramen ovale), check bilateral lower extremity venous dopplers to rule out DVT as possible source of stroke. If TEE negative, [place] cardiologist will place implantable loop recorder to evaluate for atrial fibrillation as etiology of stroke. This has been explained to patient by Dr. Pearlean Brownie and she is are agreeable. F/u Hypercoagulable panel  If TEE unrevealing, please schedule an outpatient TCD bubble study with emboli monitoring with Dr. Pearlean Brownie in 1 month to further evaluate for possible PFO. Have patient call for appointment. (I have added to discharge instruction sheet).  Ok for discharge once TEE completed  Annie Main, MSN, RN, ANVP-BC, ANP-BC, GNP-BC [facility] Pager: [phone] 12/24/2012 9:00 AM  I have personally obtained a history, examined the  patient, evaluated imaging results, and formulated the assessment and plan of care. I agree with the above.  Delia Heady, MD

## 2012-12-24 NOTE — Progress Notes (Signed)
[facility] Progress Note  HISTORY Katrina Liu is an 52 y.o. female with a history of hypertension presenting with complaint of acute onset of vertigo and nausea with associated numbness involving the right side of her face starting at 7 AM this morning 12/22/2012. She has no history of stroke nor TIA. She has not been on antiplatelet therapy. CT scan of her head showed no acute intracranial abnormality. 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 MRI of the brain showed acute as well as chronic non-hemorrhagic right PICA territory infarctions. 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 Vertigo subsided for the most part. Numbness resolved. Patient has not experienced coordination difficulty. NIH stroke score was 0. Patient was not a TPA candidate secondary to rapid resolution of symptoms. She was admitted for further evaluation and treatment.  SUBJECTIVE No complaints. Awaiting test at 2p.  OBJECTIVE Most recent Vital Signs: Filed Vitals:   12/23/12 2120 12/24/12 0144 12/24/12 0609 12/24/12 0900  BP: 147/84 115/73 114/77 135/93  Pulse: 70 63 61 75  Temp: 97.8 F (36.6 C) 98 F (36.7 C) 97.7 F (36.5 C) 97.5 F (36.4 C)  TempSrc: Oral Oral Oral Oral  Resp: 16 16 16 18   Height:      Weight:      SpO2: 100% 100% 100% 100%   CBG (last 3)   Recent Labs  12/22/12 0919  GLUCAP 113*    IV Fluid Intake:   . sodium chloride      MEDICATIONS  . aspirin EC  81 mg Oral Daily  . chlorhexidine  60 mL Topical Once  . enoxaparin (LOVENOX) injection  40 mg Subcutaneous QHS  . hydrochlorothiazide  25 mg Oral Daily  . multivitamin with minerals  1 tablet Oral Daily   PRN:  acetaminophen, senna-docusate  Diet:  NPO thin liquids Activity:  OOB with assistance DVT Prophylaxis:  Lovenox 40 mg sq daily   CLINICALLY SIGNIFICANT STUDIES Basic Metabolic Panel:    Recent Labs Lab 12/22/12 1005  NA 141  K 3.5  CL 101  CO2 29  GLUCOSE 99  BUN 11  CREATININE 0.80  CALCIUM 10.0   Liver Function Tests:   Recent Labs Lab 12/22/12 1005  AST 15  ALT 11  ALKPHOS 61  BILITOT 0.5  PROT 7.3  ALBUMIN 4.0   CBC:   Recent Labs Lab 12/22/12 1005  WBC 6.0  NEUTROABS 4.6  HGB 14.1  HCT 39.6  MCV 87.8  PLT 226   Coagulation:   Recent Labs Lab 12/22/12 1005  LABPROT 13.6  INR 1.06   Cardiac Enzymes:   Recent Labs Lab 12/22/12 1010  TROPONINI <0.30   Urinalysis:   Recent Labs Lab 12/22/12 1242  COLORURINE YELLOW  LABSPEC 1.008  PHURINE 8.0  GLUCOSEU NEGATIVE  HGBUR NEGATIVE  BILIRUBINUR NEGATIVE  KETONESUR NEGATIVE  PROTEINUR NEGATIVE  UROBILINOGEN 0.2  NITRITE NEGATIVE  LEUKOCYTESUR NEGATIVE   Lipid Panel    Component Value Date/Time   CHOL 179 12/23/2012 0500   TRIG 62 12/23/2012 0500   HDL 72 12/23/2012 0500   CHOLHDL 2.5 12/23/2012 0500   VLDL 12 12/23/2012 0500   LDLCALC 95 12/23/2012 0500   HgbA1C  Lab Results  Component Value Date   HGBA1C 5.6 12/23/2012    Urine Drug Screen:   No results found for this basename: labopia,  cocainscrnur,  labbenz,  amphetmu,  thcu,  labbarb    Alcohol Level:   Recent Labs Lab 12/22/12 1005  ETH <11   Hypercoagulable Workup Normal - C3, C4, RPR, ESR, HIV, Anti III, homocysteine, ANA, HIV Pending - CH50,  Prot C activity, Protein C total, Protein S activity, Protein S total, lupus anticoagulant, cardiolipin antibody  CT of the brain  12/22/2012    1.  No acute intracranial abnormalities. 2.  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  MRI of the brain  12/22/2012    1.  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 2.  Supratentorial subcortical white matter changes are at the upper limits of normal for age. 3.  No acute  supratentorial abnormality.    MRA of the brain  12/22/2012    1.  Asymmetric signal intensity in the PICA artery suggests a stenosis on the right, corresponding to the infarcted territories. 2.  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  2D Echocardiogram  EF 60-65% with no source of embolus.   Carotid Doppler  No evidence of hemodynamically significant internal carotid artery stenosis. Vertebral artery flow is antegrade.   TEE  CXR  12/22/2012    Normal examination.     EKG  normal sinus rhythm.   Therapy Recommendations no PT, no OT needs  Physical Exam   Pleasant middle aged african american lady not in distress.Awake alert. Afebrile. Head is nontraumatic. Neck is supple without bruit. Hearing is normal. Cardiac exam no murmur or gallop. Lungs are clear to auscultation. Distal pulses are well felt. Neurological Exam :   Awake  Alert oriented x 3. Normal speech and language.eye movements full without nystagmus.fundi were not visualized. Vision acuity and fields appear normal. Hearing is normal. Palatal movements are normal. Face symmetric. Tongue midline. Normal strength, tone, reflexes and coordination. Normal sensation. Gait deferred.  ASSESSMENT [name] is a 51 y.o. female presenting with acute onset of vertigo and nausea with associated right facial numbness.  Imaging confirms a right PICA infarct in setting of old right cerebellar infarcts. Etiology of Infarcts unknown  embolic source. On no antithrombotics prior to admission. Now on aspirin 81 mg orally every day for secondary stroke prevention. Patient with resultant left facial weakness, occasional dizziness. Work up underway.  Hypertension Hx migraines Family history of stroke (dad at age 29) LDL 95; HgbA1c 5.6 CT shows old right cerebellar hemisphere infarcts, no known hx stroke per pt, pt remembers an episode of dizziness Oct/Nov 2013 which is likely  associated     TREATMENT/PLAN  Continue aspirin 81 mg orally every day for secondary stroke prevention. TEE to look for embolic source. Arranged with [facility] for today.  If positive for PFO (patent foramen ovale), check bilateral lower extremity venous dopplers to rule out DVT as possible source of stroke. If TEE negative, [place] cardiologist will place implantable loop recorder to evaluate for atrial fibrillation as etiology of stroke. This has been explained to patient by Dr. Pearlean Brownie and she is are agreeable. F/u Hypercoagulable panel  If TEE unrevealing, please schedule an outpatient TCD bubble study with emboli monitoring with Dr. Pearlean Brownie in 1 month to further evaluate for possible PFO. Have patient call for appointment. (I have added to discharge instruction sheet).  Ok for discharge once TEE completed  Carolyne Littles, M.D., [facility]  Pager: [phone] 12/24/2012 1:10 PM  I

## 2012-12-24 NOTE — Progress Notes (Signed)
OT sign off   Patient Details Name: Katrina Liu MRN: [id number] DOB: 1960-12-08   Cancelled Treatment:    Reason Eval/Treat Not Completed: OT screened, no needs identified, will sign off  Harolyn Rutherford Pager: [phone]  12/24/2012, 10:21 AM

## 2012-12-25 ENCOUNTER — Encounter (HOSPITAL_COMMUNITY): Payer: Self-pay | Admitting: Interventional Cardiology

## 2012-12-25 DIAGNOSIS — G43909 Migraine, unspecified, not intractable, without status migrainosus: Secondary | ICD-10-CM | POA: Diagnosis not present

## 2012-12-25 DIAGNOSIS — I6789 Other cerebrovascular disease: Secondary | ICD-10-CM

## 2012-12-25 MED ORDER — ASPIRIN 81 MG PO TBEC: 81.0000 mg | DELAYED_RELEASE_TABLET | Freq: Every day | ORAL | Status: AC

## 2012-12-25 NOTE — Progress Notes (Signed)
Utilization Review Completed.   Hikeem Andersson, RN, BSN Nurse Case Manager  [phone]  

## 2012-12-25 NOTE — Discharge Summary (Signed)
Physician Discharge Summary  Katrina Liu:[id number] DOB: 08/13/1960 DOA: 12/22/2012  PCP: Neldon Labella, MD  Admit date: 12/22/2012 Discharge date: 12/25/2012  Time spent: 30 minutes  Recommendations for Outpatient Follow-up:  1. CVA; Continue aspirin 81 mg orally every day for secondary stroke prevention. Patient has loop monitor     implanted and has received instructions from cardiology on its use.   2. HTN; discharge him home medications; BP within AHA guidelines  3. Migraines; discharge him home medication      Discharge Diagnoses:  Active Problems:   Cerebellar stroke syndrome   Hypertension   Migraine, unspecified, without mention of intractable migraine without mention of status migrainosus   Discharge Condition: Stable  Diet recommendation: Heart healthy  Filed Weights   12/22/12 1500  Weight: 63.957 kg (141 lb)    History of present illness:  Katrina Liu  52 y.o BF PMHx  hypertension presenting with complaint of acute onset of vertigo and nausea with associated numbness involving the right side of her face starting at 7 AM this morning 12/22/2012. She has no history of stroke nor TIA. She has not been on antiplatelet therapy. CT scan of her head showed no acute intracranial abnormality. 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 MRI of the brain showed acute as well as chronic non-hemorrhagic right PICA territory infarctions. 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 Vertigo subsided for the most part. Numbness resolved. Patient has not experienced coordination difficulty. NIH stroke score was 0. Patient was not a TPA candidate secondary to rapid resolution of symptoms. She was admitted for further evaluation and treatment. TODAY ready for discharge. S/P day 2 loop recorder implantation; A [employer] LINQ Reveal Loop Recorder Serial  Number [id number] was inserted.     Procedures: Workup  Normal - C3, C4, RPR, ESR, HIV, Anti III, homocysteine, ANA, HIV  Pending - CH50, Prot C activity, Protein C total, Protein S activity, Protein S total, lupus anticoagulant, cardiolipin antibody   CT of the brain 12/22/2012 1. No acute intracranial abnormalities. 2. 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   MRI of the brain 12/22/2012 1. 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 2. Supratentorial subcortical white matter changes are at the upper limits of normal for age. 3. No acute supratentorial abnormality.   MRA of the brain 12/22/2012 1. Asymmetric signal intensity in the PICA artery suggests a stenosis on the right, corresponding to the infarcted territories. 2. 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   2D Echocardiogram EF 60-65% with no source of embolus.   Carotid Doppler No evidence of hemodynamically significant internal carotid artery stenosis. Vertebral artery flow is antegrade.   TEE;  Normal LV function. Mild MR. Mild TR. Normal aortic valve. No AI. No LA/LAA thrombus. Mild, focal area of aortic arch plaque. No interatrial communication by saline contrast study   CXR 12/22/2012 Normal examination.   EKG normal sinus rhythm.   Therapy Recommendations no PT, no OT needs  Loop recorder implantation; S/P day 2 loop recorder implantation; A Medtronic LINQ Reveal Loop Recorder Serial Number [id number] was inserted.      Consultations: Cardiology,  Neurology   Discharge Exam: Filed Vitals:   12/24/12 8119 12/24/12 2108 12/25/12 1478 12/25/12 2956  BP: 137/74 132/89 117/69 127/75  Pulse: 77 69 60 65  Temp: 98.4 F (36.9 C) 97.3 F (36.3 C) 97.5 F (36.4 C) 97.3 F (36.3 C)  TempSrc: Oral Oral Oral  Oral  Resp: 16 18 20 20   Height:      Weight:      SpO2: 100% 99% 100% 100%    General: Alert,NAD Cardiovascular: Regular rhythm and rate, negative murmurs rubs or gallops, DP/PT pulse 2+ bilateral Respiratory: Clear to auscultation bilateral Neurologic; within normal limits  Discharge Instructions     Medication List    ASK your doctor about these medications       aspirin-acetaminophen-caffeine 250-250-65 MG per tablet  Commonly known as:  EXCEDRIN MIGRAINE  Take 1 tablet by mouth every 6 (six) hours as needed. For headaches     hydrochlorothiazide 25 MG tablet  Commonly known as:  HYDRODIURIL  Take 25 mg by mouth daily.     mometasone 0.1 % ointment  Commonly known as:  ELOCON  Apply 1 application topically daily.     multivitamin with minerals tablet  Take 1 tablet by mouth daily. Hair, Skin and Nails       No Known Allergies     Follow-up Information   Follow up with SETHI,PRAMODKUMAR P, MD In 1 month. (ask scheduler for a TCD bubble study and emboli monioring. you will see Dr. Pearlean Brownie at that time)    Specialties:  Neurology, Radiology   Contact information:   [address] [place] [address] [phone]        The results of significant diagnostics from this hospitalization (including imaging, microbiology, ancillary and laboratory) are listed below for reference.    Significant Diagnostic Studies: Dg Chest 2 View  12/22/2012   CLINICAL DATA:  Right facial numbness. Dizziness.  EXAM: CHEST  2 VIEW  COMPARISON:  None.  FINDINGS: The heart size and mediastinal contours are within normal limits. Both lungs are clear. The visualized skeletal structures are unremarkable.  IMPRESSION: Normal examination.   Electronically Signed   By: Gordan Payment   On: 12/22/2012 14:29   Ct Head Wo Contrast  12/22/2012   *RADIOLOGY REPORT*  Clinical Data: Right-sided facial droop.  Slurred speech.  CT HEAD WITHOUT CONTRAST  Technique:  Contiguous axial images  were obtained from the base of the skull through the vertex without contrast.  Comparison: No priors.  Findings: Faint physiologic calcifications are noted within the basal ganglia bilaterally. Multiple well-defined areas of low attenuation in the right cerebellar hemisphere are compatible with old infarctions. No acute intracranial abnormalities. Specifically, no evidence of acute intracranial hemorrhage, no definite findings of acute/subacute cerebral ischemia, no mass, mass effect, hydrocephalus or abnormal intra or extra-axial fluid collections.  Visualized paranasal sinuses and mastoids are well pneumatized.  No acute displaced skull fractures are identified.  IMPRESSION: 1.  No acute intracranial abnormalities. 2.  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   Original Report Authenticated By: Trudie Reed, M.D.   Mr [facility] Wo Contrast  12/22/2012   *RADIOLOGY REPORT*  Clinical Data:  Dizziness.  MRI HEAD WITHOUT CONTRAST MRA HEAD WITHOUT CONTRAST  Technique:  Multiplanar, multiecho pulse sequences of the brain and surrounding structures were obtained without intravenous contrast. Angiographic images of the head were obtained using MRA technique without contrast.  Comparison:  CT head without contrast from the same day.  MRI HEAD  Findings:  Multiple acute non hemorrhagic right PICA territory infarcts are scattered amount stool  several old lacunar infarcts in the same territory.  No other vascular territory infarct is present.  Minimal supratentorial white matter disease is at the upper limits of normal for age.  No hemorrhage or mass lesion is present.  The ventricles are of normal size.  No significant extra- axial fluid collection is present.  T2 hyperintensity is associated with the acute right PICA territory lesions.  There is mild sulcal effacement without significant mass effect on the fourth ventricle or midline shift.  The vertebral arteries and basilar artery are small but  patent. The anterior circulation is within normal limits.  The globes orbits are intact.  The paranasal sinuses and mastoid air cells are clear.  IMPRESSION:  1.  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 2.  Supratentorial subcortical white matter changes are at the upper limits of normal for age. 3.  No acute supratentorial abnormality.  MRA HEAD  Findings: The internal carotid arteries are within normal limits from high cervical segments through the ICA termini bilaterally. The A1 and M1 segments are normal.  The anterior communicating artery is patent.  ACA and MCA branch vessels are within normal limits.  The vertebral arteries and basilar artery are hypoplastic.  The left PICA origin is visualized and within normal limits.  The right PICA is significantly more faint, suggesting a focal stenosis. Both posterior cerebral arteries are predominately of fetal type with small P1 segments, right greater than left.  The PCA branch vessels are within normal limits.  IMPRESSION:  1.  Asymmetric signal intensity in the PICA artery suggests a stenosis on the right, corresponding to the infarcted territories. 2.  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   Original Report Authenticated By: Marin Roberts, M.D.   Mr Brain Wo Contrast  12/22/2012   *RADIOLOGY REPORT*  Clinical Data:  Dizziness.  MRI HEAD WITHOUT CONTRAST MRA HEAD WITHOUT CONTRAST  Technique:  Multiplanar, multiecho pulse sequences of the brain and surrounding structures were obtained without intravenous contrast. Angiographic images of the head were obtained using MRA technique without contrast.  Comparison:  CT head without contrast from the same day.  MRI HEAD  Findings:  Multiple acute non hemorrhagic right PICA territory infarcts are scattered amount stool  several old lacunar infarcts in the same territory.  No other vascular territory infarct is present.  Minimal supratentorial white matter disease is at the upper limits of normal for age.  No hemorrhage or mass lesion is present.  The ventricles are of normal size.  No significant extra- axial fluid collection is present.  T2 hyperintensity is associated with the acute right PICA territory lesions.  There is mild sulcal effacement without significant mass effect on the fourth ventricle or midline shift.  The vertebral arteries and basilar artery are small but patent. The anterior circulation is within normal limits.  The globes orbits are intact.  The paranasal sinuses and mastoid air cells are clear.  IMPRESSION:  1.  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 2.  Supratentorial subcortical white matter changes are at the upper limits of normal for age. 3.  No acute supratentorial abnormality.  MRA HEAD  Findings: The internal carotid arteries are within normal limits from high cervical segments through the ICA termini bilaterally.  The A1 and M1 segments are normal.  The anterior communicating artery is patent.  ACA and MCA branch vessels are within normal limits.  The vertebral arteries and basilar artery are hypoplastic.  The left PICA origin is visualized and within normal limits.  The right PICA is significantly more faint, suggesting a focal stenosis. Both posterior cerebral arteries are predominately of fetal type with small P1 segments, right greater than left.  The PCA branch vessels are within normal limits.  IMPRESSION:  1.  Asymmetric signal intensity in the PICA artery suggests a stenosis on the right, corresponding to the infarcted territories. 2.  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   Original Report  Authenticated By: Marin Roberts, M.D.    Microbiology: No results found for this or any previous visit (from the past 240 hour(s)).   Labs: Basic Metabolic Panel:  Recent Labs Lab 12/22/12 1005  NA 141  K 3.5  CL 101  CO2 29  GLUCOSE 99  BUN 11  CREATININE 0.80  CALCIUM 10.0   Liver Function Tests:  Recent Labs Lab 12/22/12 1005  AST 15  ALT 11  ALKPHOS 61  BILITOT 0.5  PROT 7.3  ALBUMIN 4.0   No results found for this basename: LIPASE, AMYLASE,  in the last 168 hours No results found for this basename: AMMONIA,  in the last 168 hours CBC:  Recent Labs Lab 12/22/12 1005  WBC 6.0  NEUTROABS 4.6  HGB 14.1  HCT 39.6  MCV 87.8  PLT 226   Cardiac Enzymes:  Recent Labs Lab 12/22/12 1010  TROPONINI <0.30   BNP: BNP (last 3 results) No results found for this basename: PROBNP,  in the last 8760 hours CBG:  Recent Labs Lab 12/22/12 0919  GLUCAP 113*       Signed:  Okema Rollinson, J  [facility] 12/25/2012, 8:55 AM

## 2012-12-25 NOTE — Progress Notes (Signed)
Stroke Team Progress Note  HISTORY Katrina Liu is an 52 y.o. female with a history of hypertension presenting with complaint of acute onset of vertigo and nausea with associated numbness involving the right side of her face starting at 7 AM this morning 12/22/2012. She has no history of stroke nor TIA. She has not been on antiplatelet therapy. CT scan of her head showed no acute intracranial abnormality. 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 MRI of the brain showed acute as well as chronic non-hemorrhagic right PICA territory infarctions. 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 Vertigo subsided for the most part. Numbness resolved. Patient has not experienced coordination difficulty. NIH stroke score was 0. Patient was not a TPA candidate secondary to rapid resolution of symptoms. She was admitted for further evaluation and treatment.  SUBJECTIVE Pt up in room, dressed, ready to go home. Of interest, she cannot "feel" the loop recorder that was placed yesterday (BMI normal).  OBJECTIVE Most recent Vital Signs: Filed Vitals:   12/24/12 1808 12/24/12 2108 12/25/12 0137 12/25/12 0523  BP: 137/74 132/89 117/69 127/75  Pulse: 77 69 60 65  Temp: 98.4 F (36.9 C) 97.3 F (36.3 C) 97.5 F (36.4 C) 97.3 F (36.3 C)  TempSrc: Oral Oral Oral Oral  Resp: 16 18 20 20   Height:      Weight:      SpO2: 100% 99% 100% 100%   CBG (last 3)   Recent Labs  12/22/12 0919  GLUCAP 113*    IV Fluid Intake:     MEDICATIONS  . aspirin EC  81 mg Oral Daily  . enoxaparin (LOVENOX) injection  40 mg Subcutaneous QHS  . hydrochlorothiazide  25 mg Oral Daily  . multivitamin with minerals  1 tablet Oral Daily   PRN:  acetaminophen, senna-docusate  Diet:  General thin liquids Activity:  OOB with assistance DVT Prophylaxis:  Lovenox 40 mg sq daily   CLINICALLY SIGNIFICANT  STUDIES Basic Metabolic Panel:   Recent Labs Lab 12/22/12 1005  NA 141  K 3.5  CL 101  CO2 29  GLUCOSE 99  BUN 11  CREATININE 0.80  CALCIUM 10.0   Liver Function Tests:   Recent Labs Lab 12/22/12 1005  AST 15  ALT 11  ALKPHOS 61  BILITOT 0.5  PROT 7.3  ALBUMIN 4.0   CBC:   Recent Labs Lab 12/22/12 1005  WBC 6.0  NEUTROABS 4.6  HGB 14.1  HCT 39.6  MCV 87.8  PLT 226   Coagulation:   Recent Labs Lab 12/22/12 1005  LABPROT 13.6  INR 1.06   Cardiac Enzymes:   Recent Labs Lab 12/22/12 1010  TROPONINI <0.30   Urinalysis:   Recent Labs Lab 12/22/12 1242  COLORURINE YELLOW  LABSPEC 1.008  PHURINE 8.0  GLUCOSEU NEGATIVE  HGBUR NEGATIVE  BILIRUBINUR NEGATIVE  KETONESUR NEGATIVE  PROTEINUR NEGATIVE  UROBILINOGEN 0.2  NITRITE NEGATIVE  LEUKOCYTESUR NEGATIVE   Lipid Panel    Component Value Date/Time   CHOL 179 12/23/2012 0500   TRIG 62 12/23/2012 0500   HDL 72 12/23/2012 0500   CHOLHDL 2.5 12/23/2012 0500   VLDL 12 12/23/2012 0500   LDLCALC 95 12/23/2012 0500   HgbA1C  Lab Results  Component Value Date   HGBA1C 5.6 12/23/2012   Urine Drug Screen:   No results found for this basename: labopia,  cocainscrnur,  labbenz,  amphetmu,  thcu,  labbarb    Alcohol Level:   Recent Labs Lab 12/22/12 1005  ETH <11   Hypercoagulable Workup Normal - C3, C4, RPR, ESR, HIV, Anti III, homocysteine, ANA, HIV Pending - CH50,  Protein C total, Protein S activity, Protein S total, lupus anticoagulant, cardiolipin antibody Prot C activity 196  CT of the brain  12/22/2012    1.  No acute intracranial abnormalities. 2.  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  MRI of the brain  12/22/2012    1.  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 2.  Supratentorial subcortical white matter changes are at the upper limits of  normal for age. 3.  No acute supratentorial abnormality.    MRA of the brain  12/22/2012    1.  Asymmetric signal intensity in the PICA artery suggests a stenosis on the right, corresponding to the infarcted territories. 2.  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  2D Echocardiogram  EF 60-65% with no source of embolus.   Carotid Doppler  No evidence of hemodynamically significant internal carotid artery stenosis. Vertebral artery flow is antegrade.   TEE  Normal LV function. Mild MR. Mild TR. Normal aortic valve. No AI. No LA/LAA thrombus. Mild, focal area of aortic arch plaque. No interatrial communication by saline contrast study.   CXR  12/22/2012    Normal examination.     EKG  normal sinus rhythm.   Therapy Recommendations no PT, no OT needs  Physical Exam   Pleasant middle aged african american lady not in distress.Awake alert. Afebrile. Head is nontraumatic. Neck is supple without bruit. Hearing is normal. Cardiac exam no murmur or gallop. Lungs are clear to auscultation. Distal pulses are well felt. Neurological Exam :   Awake  Alert oriented x 3. Normal speech and language.eye movements full without nystagmus.fundi were not visualized. Vision acuity and fields appear normal. Hearing is normal. Palatal movements are normal. Face symmetric. Tongue midline. Normal strength, tone, reflexes and coordination. Normal sensation. Gait deferred.  ASSESSMENT [name] is a 52 y.o. female presenting with acute onset of vertigo and nausea with associated right facial numbness.  Imaging confirms a right PICA infarct in setting of old right cerebellar infarcts. Etiology of embolic Infarcts, TEE negative, loop recorder placed. On no antithrombotics prior to admission. Now on aspirin 81 mg orally every day for secondary stroke prevention. Patient with resultant left facial weakness, occasional dizziness. Work up  underway.  Hypertension Hx migraines Family history of stroke (dad at age 100) LDL 95; HgbA1c 5.6 CT shows old right cerebellar hemisphere infarcts, no known hx stroke per pt, pt remembers an episode of dizziness Oct/Nov 2013 which is likely associated  Hospital day # 3  TREATMENT/PLAN  Continue aspirin 81 mg orally every day for secondary stroke prevention. F/u loop recorder F/u Hypercoagulable panel  Please schedule an outpatient TCD bubble study with emboli monitoring with Dr. Pearlean Brownie in 1 month to further evaluate for possible PFO. Have patient call for appointment. (I have added to discharge instruction sheet).   Ok for discharge from stroke standpoint No further stroke workup indicated. Patient has a 10-15% risk of having another stroke over the next year, the highest risk is within 2 weeks of the most recent stroke/TIA (risk of having a stroke following a stroke or TIA is the same).  Ongoing risk factor control by Primary Care Physician Stroke Service will sign off. Please call should any needs arise. Follow up with Dr. Pearlean Brownie, Stroke Clinic, in 2 months.   Annie Main, MSN, RN, ANVP-BC, ANP-BC, [facility] Pager: [phone] 12/25/2012 8:36 AM  I have personally obtained a history, examined the patient, evaluated imaging results, and formulated the assessment and plan of care. I agree with the above. Delia Heady, MD

## 2013-01-01 ENCOUNTER — Ambulatory Visit (INDEPENDENT_AMBULATORY_CARE_PROVIDER_SITE_OTHER): Payer: BC Managed Care – PPO | Admitting: *Deleted

## 2013-01-01 DIAGNOSIS — G464 Cerebellar stroke syndrome: Secondary | ICD-10-CM

## 2013-01-01 DIAGNOSIS — I6789 Other cerebrovascular disease: Secondary | ICD-10-CM

## 2013-01-01 LAB — PACEMAKER DEVICE OBSERVATION

## 2013-01-01 NOTE — Progress Notes (Signed)
Pt seen in device clinic for follow up of recently implanted ILR.  No redness, swelling, or edema.  Steri-strips removed prior to arrival.   No tachy, brady, asystole, or symptomatic episodes; report in paceart.  Pt denies chest pain, shortness of breath, palpitations, or dizziness.  Pt to follow up PRN (cryptogenic stroke).   Katrina Liu 01/01/2013 10:33 AM

## 2013-01-06 ENCOUNTER — Telehealth: Payer: Self-pay | Admitting: Internal Medicine

## 2013-01-06 NOTE — Telephone Encounter (Signed)
New Problem  Pt recently had a cardiac implant and has had dizzy spells.

## 2013-01-09 NOTE — Telephone Encounter (Signed)
Spoke with patient who states she feels fine right now. She states she only has "a little bit of lightheadedness at times", mostly in the morning. She says that she used to take her medications in the morning around 10 am when she ate something, but every since she got out of the hospital she has been taking them on an empty stomach with water, sometimes juice. (she takes her HCTZ, ASA, and mult-vit in the mornings). We discussed going back to taking her medications with food in the morning and to see if improvement in symptoms occur. She agreed to plan and to call back if this did not work.

## 2013-02-03 ENCOUNTER — Encounter: Payer: Self-pay | Admitting: Internal Medicine

## 2013-02-05 ENCOUNTER — Ambulatory Visit (INDEPENDENT_AMBULATORY_CARE_PROVIDER_SITE_OTHER): Payer: BC Managed Care – PPO | Admitting: *Deleted

## 2013-02-05 DIAGNOSIS — I6789 Other cerebrovascular disease: Secondary | ICD-10-CM

## 2013-02-05 DIAGNOSIS — G464 Cerebellar stroke syndrome: Secondary | ICD-10-CM

## 2013-02-12 ENCOUNTER — Other Ambulatory Visit: Payer: Self-pay | Admitting: Neurology

## 2013-02-12 DIAGNOSIS — I6350 Cerebral infarction due to unspecified occlusion or stenosis of unspecified cerebral artery: Secondary | ICD-10-CM

## 2013-02-23 LAB — PACEMAKER DEVICE OBSERVATION

## 2013-02-27 ENCOUNTER — Ambulatory Visit (INDEPENDENT_AMBULATORY_CARE_PROVIDER_SITE_OTHER): Payer: Self-pay

## 2013-02-27 ENCOUNTER — Encounter: Payer: Self-pay | Admitting: Neurology

## 2013-02-27 ENCOUNTER — Ambulatory Visit (INDEPENDENT_AMBULATORY_CARE_PROVIDER_SITE_OTHER): Payer: BC Managed Care – PPO | Admitting: Neurology

## 2013-02-27 ENCOUNTER — Ambulatory Visit (INDEPENDENT_AMBULATORY_CARE_PROVIDER_SITE_OTHER): Payer: BC Managed Care – PPO

## 2013-02-27 DIAGNOSIS — I63219 Cerebral infarction due to unspecified occlusion or stenosis of unspecified vertebral arteries: Secondary | ICD-10-CM

## 2013-02-27 DIAGNOSIS — I6350 Cerebral infarction due to unspecified occlusion or stenosis of unspecified cerebral artery: Secondary | ICD-10-CM

## 2013-02-27 DIAGNOSIS — Z0289 Encounter for other administrative examinations: Secondary | ICD-10-CM

## 2013-02-27 NOTE — Progress Notes (Signed)
[facility] [address] [place]. [place] [address] [phone]       OFFICE FOLLOW-UP NOTE  Katrina. Katrina Liu Date of Birth:  1960/05/27 Medical Record Number:  [id number]   HPI: Katrina Liu is a 43 year African American lady seen today for first office followup visit for hospital admission for stroke on 12/22/12. She woke up with sudden onset of vertigo, nausea and numbness involving the right sterile face. Her symptoms persisted and she came to the hospital but presented be on time window for thrombolysis and with improving symptoms. MRI scan of the brain showed an chronic nonhemorrhagic right posterior inferior cerebral to artery infarct along with her acute component adjacent to it. MRA of the brain showed post inferior cerebral artery stenosis as well as hypoplastic vertebral arteries and basilar artery with persistent fetal origin for posterior cerebral arteries. Patient's symptoms quickly resolve after admission. Lipid profile showed LDL of 95. Hemoglobin A1c was 4.6. Carotid ultrasound showed no significant extracranial stenosis. Complement levels and hypercoagulable panel labs were normal except for borderline low protein S of 62 and low anticardiolipin antibody titres.. Trans-esophageal echocardiogram showed no cardiac source from [facility] patent foraminal valley treated there was mild focal aortic arch plaque noted. Transthoracic echo showed normal ejection fraction. Patient was started on aspirin for stroke prevention and was taken to hypertension. She also had loop recorder inserted to identify paroxysmal atrial fibrillation. She states she has done well and she has had no recurrent symptoms. Systolic and has been well. She states blood pressure control. She has not had any treatment detected yet on the loop recorder. She has returned back to work without restrictions and has no complaints today.  ROS:   14 system review of systems is positive for  No complaints  today  PMH:  Past Medical History  Diagnosis Date  . Headache(784.0)   . Anemia   . GERD (gastroesophageal reflux disease)     h/o - not now  . S/P abdominal hysterectomy 04/17/2011  . Stroke     Social History:  History   Social History  . Marital Status: Divorced    Spouse Name: N/A    Number of Children: N/A  . Years of Education: N/A   Occupational History  . Not on file.   Social History Main Topics  . Smoking status: Never Smoker   . Smokeless tobacco: Not on file  . Alcohol Use: No  . Drug Use: No  . Sexual Activity: Not on file   Other Topics Concern  . Not on file   Social History Narrative  . No narrative on file    Medications:   Current Outpatient Prescriptions on File Prior to Visit  Medication Sig Dispense Refill  . aspirin EC 81 MG EC tablet Take 1 tablet (81 mg total) by mouth daily.  90 tablet  0  . aspirin-acetaminophen-caffeine (EXCEDRIN MIGRAINE) 250-250-65 MG per tablet Take 1 tablet by mouth every 6 (six) hours as needed. For headaches       . hydrochlorothiazide (HYDRODIURIL) 25 MG tablet Take 25 mg by mouth daily.      . mometasone (ELOCON) 0.1 % ointment Apply 1 application topically daily.        . Multiple Vitamins-Minerals (MULTIVITAMIN WITH MINERALS) tablet Take 1 tablet by mouth daily. Hair, Skin and Nails       No current facility-administered medications on file prior to visit.    Allergies:  No Known Allergies  Physical Exam General: well  developed, well nourished, seated, in no evident distress Head: head normocephalic and atraumatic. Orohparynx benign Neck: supple with no carotid or supraclavicular bruits Cardiovascular: regular rate and rhythm, no murmurs Musculoskeletal: no deformity Skin:  no rash/petichiae Vascular:  Normal pulses all extremities  Neurologic Exam Mental Status: Awake and fully alert. Oriented to place and time. Recent and remote memory intact. Attention span, concentration and fund of knowledge  appropriate. Mood and affect appropriate.  Cranial Nerves: Fundoscopic exam not done  Pupils equal, briskly reactive to light. Extraocular movements full without nystagmus. Visual fields full to confrontation. Hearing intact. Facial sensation intact. Face, tongue, palate moves normally and symmetrically.  Motor: Normal bulk and tone. Normal strength in all tested extremity muscles. Sensory.: intact to touch and pinprick and vibratory.  Coordination: Rapid alternating movements normal in all extremities. Finger-to-nose and heel-to-shin performed accurately bilaterally. Gait and Station: Arises from chair without difficulty. Stance is normal. Gait demonstrates normal stride length and balance . Able to heel, toe and tandem walk without difficulty.  Reflexes: 1+ and symmetric. Toes downgoing.   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 Significant vascular risk factors of hypertension, family history of stroke and borderline lipids.    PLAN: Continue aspirin for stroke prevention restrict control of hypertension with blood pressure goal below 130/90. Continue loop recorder for paroxysmal atrial fibrillation. Check transcanal Doppler bubble study for small PFO missed. on TEE . Followup with Darolyn Rua in 3 months.       [facility]      [address]      [place]. [place] [address] [phone]       TRANSCRANIAL DOPPLER BUBBLE STUDY   Katrina. Katrina Liu Date of Birth:  1960/12/20 Medical Record Number:  [id number]   Indications: Diagnostic Date of Procedure: 02/27/13 Clinical History:   21 year lady with cryptogenic stroke Technical Description:   Transcranial Doppler Bubble Study was performed at the bedside after taking written informed consent from the patient and explaining risk/benefits. Both middle cerebral arteries were insonated  using a headset. And IV line was inserted in the left forearm by the RN using aseptic precautions. Agitated saline injection at rest and after valsalva maneuver  did not result in high intensity transient signals (HITS).   Impression:  Negative Transcranial Doppler Bubble Study  not indicative of right to left intracardiac shunt.   Results were explained to the patient. Questions were answered.

## 2013-02-27 NOTE — Patient Instructions (Signed)
Continue aspirin for stroke prevention and strict control of hypertension with blood pressure goal below 130/90. Return for followup in 3 months with Heide Guile., NP, or call earlier if necessary.

## 2013-03-05 ENCOUNTER — Encounter: Payer: Self-pay | Admitting: Internal Medicine

## 2013-03-05 ENCOUNTER — Other Ambulatory Visit: Payer: Self-pay

## 2013-03-09 ENCOUNTER — Ambulatory Visit (INDEPENDENT_AMBULATORY_CARE_PROVIDER_SITE_OTHER): Payer: BC Managed Care – PPO | Admitting: *Deleted

## 2013-03-09 DIAGNOSIS — I6789 Other cerebrovascular disease: Secondary | ICD-10-CM

## 2013-03-09 DIAGNOSIS — G464 Cerebellar stroke syndrome: Secondary | ICD-10-CM

## 2013-03-18 LAB — MDC_IDC_ENUM_SESS_TYPE_REMOTE

## 2013-04-08 ENCOUNTER — Ambulatory Visit (INDEPENDENT_AMBULATORY_CARE_PROVIDER_SITE_OTHER): Payer: BC Managed Care – PPO | Admitting: *Deleted

## 2013-04-08 DIAGNOSIS — I6350 Cerebral infarction due to unspecified occlusion or stenosis of unspecified cerebral artery: Secondary | ICD-10-CM

## 2013-04-27 ENCOUNTER — Encounter: Payer: Self-pay | Admitting: Internal Medicine

## 2013-05-08 ENCOUNTER — Ambulatory Visit (INDEPENDENT_AMBULATORY_CARE_PROVIDER_SITE_OTHER): Payer: BC Managed Care – PPO | Admitting: *Deleted

## 2013-05-08 DIAGNOSIS — I6789 Other cerebrovascular disease: Secondary | ICD-10-CM

## 2013-05-08 DIAGNOSIS — G464 Cerebellar stroke syndrome: Secondary | ICD-10-CM

## 2013-05-25 ENCOUNTER — Other Ambulatory Visit: Payer: Self-pay

## 2013-05-25 DIAGNOSIS — Z1231 Encounter for screening mammogram for malignant neoplasm of breast: Secondary | ICD-10-CM

## 2013-05-29 ENCOUNTER — Ambulatory Visit
Admission: RE | Admit: 2013-05-29 | Discharge: 2013-05-29 | Disposition: A | Discharge status: Home / Self Care | Payer: Self-pay | Source: Ambulatory Visit

## 2013-05-29 DIAGNOSIS — Z1231 Encounter for screening mammogram for malignant neoplasm of breast: Secondary | ICD-10-CM

## 2013-06-01 ENCOUNTER — Ambulatory Visit: Payer: BC Managed Care – PPO

## 2013-06-03 LAB — MDC_IDC_ENUM_SESS_TYPE_REMOTE

## 2013-06-04 ENCOUNTER — Ambulatory Visit (INDEPENDENT_AMBULATORY_CARE_PROVIDER_SITE_OTHER): Payer: BC Managed Care – PPO | Admitting: *Deleted

## 2013-06-04 ENCOUNTER — Other Ambulatory Visit (HOSPITAL_COMMUNITY): Payer: Self-pay | Admitting: Oncology

## 2013-06-04 DIAGNOSIS — I6789 Other cerebrovascular disease: Secondary | ICD-10-CM

## 2013-06-04 DIAGNOSIS — G464 Cerebellar stroke syndrome: Secondary | ICD-10-CM

## 2013-06-12 ENCOUNTER — Encounter: Payer: Self-pay | Admitting: Internal Medicine

## 2013-06-18 ENCOUNTER — Ambulatory Visit: Payer: BC Managed Care – PPO | Admitting: Nurse Practitioner

## 2013-06-25 LAB — MDC_IDC_ENUM_SESS_TYPE_REMOTE

## 2013-07-10 ENCOUNTER — Ambulatory Visit (INDEPENDENT_AMBULATORY_CARE_PROVIDER_SITE_OTHER): Payer: BC Managed Care – PPO | Admitting: *Deleted

## 2013-07-10 DIAGNOSIS — G464 Cerebellar stroke syndrome: Secondary | ICD-10-CM

## 2013-07-10 DIAGNOSIS — I6789 Other cerebrovascular disease: Secondary | ICD-10-CM

## 2013-07-10 LAB — MDC_IDC_ENUM_SESS_TYPE_REMOTE

## 2013-08-03 ENCOUNTER — Encounter: Payer: Self-pay | Admitting: Internal Medicine

## 2013-08-04 ENCOUNTER — Ambulatory Visit (INDEPENDENT_AMBULATORY_CARE_PROVIDER_SITE_OTHER): Payer: BC Managed Care – PPO | Admitting: *Deleted

## 2013-08-04 DIAGNOSIS — I6350 Cerebral infarction due to unspecified occlusion or stenosis of unspecified cerebral artery: Secondary | ICD-10-CM

## 2013-08-04 LAB — MDC_IDC_ENUM_SESS_TYPE_REMOTE

## 2013-09-11 ENCOUNTER — Ambulatory Visit (INDEPENDENT_AMBULATORY_CARE_PROVIDER_SITE_OTHER): Payer: BC Managed Care – PPO | Admitting: *Deleted

## 2013-09-11 DIAGNOSIS — I6350 Cerebral infarction due to unspecified occlusion or stenosis of unspecified cerebral artery: Secondary | ICD-10-CM

## 2013-09-11 LAB — MDC_IDC_ENUM_SESS_TYPE_REMOTE

## 2013-09-15 ENCOUNTER — Encounter: Payer: Self-pay | Admitting: Internal Medicine

## 2013-10-01 ENCOUNTER — Telehealth: Payer: Self-pay | Admitting: Internal Medicine

## 2013-10-01 NOTE — Telephone Encounter (Signed)
Follow up    Pt called to make sure this office got her transmissions.  Because on My Chart It shows missed apptointments.   Please give her a call back at work on what she should do if so?

## 2013-10-01 NOTE — Telephone Encounter (Signed)
Left message for patient that we have received transmissions.

## 2013-10-02 ENCOUNTER — Ambulatory Visit (INDEPENDENT_AMBULATORY_CARE_PROVIDER_SITE_OTHER): Payer: BC Managed Care – PPO | Admitting: Nurse Practitioner

## 2013-10-02 ENCOUNTER — Encounter: Payer: Self-pay | Admitting: Nurse Practitioner

## 2013-10-02 VITALS — BP 159/95 | HR 67 | Temp 97.2°F | Ht 65.0 in | Wt 144.0 lb

## 2013-10-02 DIAGNOSIS — I63219 Cerebral infarction due to unspecified occlusion or stenosis of unspecified vertebral arteries: Secondary | ICD-10-CM

## 2013-10-02 NOTE — Patient Instructions (Signed)
Continue aspirin for stroke prevention restrict control of hypertension with blood pressure goal below 130/90. Continue loop recorder for paroxysmal atrial fibrillation. Followup in 6 months, sooner as needed.  Stroke Prevention Some medical conditions and behaviors are associated with an increased chance of having a stroke. You may prevent a stroke by making healthy choices and managing medical conditions. HOW CAN I REDUCE MY RISK OF HAVING A STROKE?   Stay physically active. Get at least 30 minutes of activity on most or all days.  Do not smoke. It may also be helpful to avoid exposure to secondhand smoke.  Limit alcohol use. Moderate alcohol use is considered to be:  No more than 2 drinks per day for men.  No more than 1 drink per day for nonpregnant women.  Eat healthy foods. This involves  Eating 5 or more servings of fruits and vegetables a day.  Following a diet that addresses high blood pressure (hypertension), high cholesterol, diabetes, or obesity.  Manage your cholesterol levels.  A diet low in saturated fat, trans fat, and cholesterol and high in fiber may control cholesterol levels.  Take any prescribed medicines to control cholesterol as directed by your health care provider.  Manage your diabetes.  A controlled-carbohydrate, controlled-sugar diet is recommended to manage diabetes.  Take any prescribed medicines to control diabetes as directed by your health care provider.  Control your hypertension.  A low-salt (sodium), low-saturated fat, low-trans fat, and low-cholesterol diet is recommended to manage hypertension.  Take any prescribed medicines to control hypertension as directed by your health care provider.  Maintain a healthy weight.  A reduced-calorie, low-sodium, low-saturated fat, low-trans fat, low-cholesterol diet is recommended to manage weight.  Stop drug abuse.  Avoid taking birth control pills.  Talk to your health care provider about the  risks of taking birth control pills if you are over 20 years old, smoke, get migraines, or have ever had a blood clot.  Get evaluated for sleep disorders (sleep apnea).  Talk to your health care provider about getting a sleep evaluation if you snore a lot or have excessive sleepiness.  Take medicines as directed by your health care provider.  For some people, aspirin or blood thinners (anticoagulants) are helpful in reducing the risk of forming abnormal blood clots that can lead to stroke. If you have the irregular heart rhythm of atrial fibrillation, you should be on a blood thinner unless there is a good reason you cannot take them.  Understand all your medicine instructions.  Make sure that other other conditions (such as anemia or atherosclerosis) are addressed. SEEK IMMEDIATE MEDICAL CARE IF:   You have sudden weakness or numbness of the face, arm, or leg, especially on one side of the body.  Your face or eyelid droops to one side.  You have sudden confusion.  You have trouble speaking (aphasia) or understanding.  You have sudden trouble seeing in one or both eyes.  You have sudden trouble walking.  You have dizziness.  You have a loss of balance or coordination.  You have a sudden, severe headache with no known cause.  You have new chest pain or an irregular heartbeat. Any of these symptoms may represent a serious problem that is an emergency. Do not wait to see if the symptoms will go away. Get medical help at once. Call your local emergency services  (911 in [place] Do not drive yourself to the hospital. Document Released: 05/24/2004 Document Revised: 02/04/2013 Document Reviewed: 10/17/2012 Exit[employer] Patient Information  2014 [place], [place].

## 2013-10-02 NOTE — Progress Notes (Signed)
PATIENT: Katrina Liu DOB: 1960/07/23  REASON FOR VISIT: routine follow up for stroke HISTORY FROM: patient  HISTORY OF PRESENT ILLNESS: Katrina Liu is a 83 year African American lady seen today for first office followup visit for hospital admission for stroke on 12/22/12. She woke up with sudden onset of vertigo, nausea and numbness involving the right side of her face. Her symptoms persisted and she came to the hospital but presented be on time window for thrombolysis and with improving symptoms. MRI scan of the brain showed an chronic nonhemorrhagic right posterior inferior cerebral to artery infarct along with her acute component adjacent to it. MRA of the brain showed post inferior cerebral artery stenosis as well as hypoplastic vertebral arteries and basilar artery with persistent fetal origin for posterior cerebral arteries. Patient's symptoms quickly resolve after admission. Lipid profile showed LDL of 95. Hemoglobin A1c was 4.6. Carotid ultrasound showed no significant extracranial stenosis. Complement levels and hypercoagulable panel labs were normal except for borderline low protein S of 62 and low anticardiolipin antibody titres.  Trans-esophageal echocardiogram showed no cardiac source or patent foraminal [place]. There was mild focal aortic arch plaque noted. Transthoracic echo showed normal ejection fraction. Patient was started on aspirin for stroke prevention.  She also had loop recorder inserted to identify paroxysmal atrial fibrillation. She states she has done well and she has had no recurrent symptoms. Systolic blood pressure has been well. She has not had any arrythmia detected yet on the loop recorder. She has returned back to work without restrictions and has no complaints today. Negative Transcranial Doppler Bubble Study not indicative of right to left intracardiac shunt.   UPDATE 10/02/13 (LL):  Since last visit, she states she has been doing very well. She has not had any  reported arrhythmias with her loop recorder.  She exercises regularly with cardio and weights.  She has no new neurovascular symptoms.  She states her blood pressure is well controlled though it is 159/95 in the office today; she states she has not yet taken her HCTZ today.  She is tolerating aspirin well with no signs of significant bleeding or bruising.  She is bothered with insomnia and hot flashes, starting menopause, going to see her Gyn on Monday.  ROS:  14 system review of systems is positive for insomnia only.  ALLERGIES: No Known Allergies  HOME MEDICATIONS: Outpatient Prescriptions Prior to Visit  Medication Sig Dispense Refill  . aspirin EC 81 MG EC tablet Take 1 tablet (81 mg total) by mouth daily.  90 tablet  0  . hydrochlorothiazide (HYDRODIURIL) 25 MG tablet Take 25 mg by mouth daily.      . mometasone (ELOCON) 0.1 % ointment Apply 1 application topically daily.        . Multiple Vitamins-Minerals (MULTIVITAMIN WITH MINERALS) tablet Take 1 tablet by mouth daily. Hair, Skin and Nails       No facility-administered medications prior to visit.     PHYSICAL EXAM  Filed Vitals:   10/02/13 1507  BP: 159/95  Pulse: 67  Temp: 97.2 F (36.2 C)  TempSrc: Oral  Height: 5\' 5"  (1.651 m)  Weight: 144 lb (65.318 kg)   Body mass index is 23.96 kg/(m^2). No exam data present   Generalized: Well developed, in no acute distress  Head: normocephalic and atraumatic. Oropharynx benign  Neck: Supple, no carotid bruits  Cardiac: Regular rate rhythm, no murmur  Musculoskeletal: No deformity   Neurological examination  Mentation: Alert oriented to time,  place, history taking. Follows all commands speech and language fluent Cranial nerve II-XII: Pupils were equal round reactive to light extraocular movements were full, visual field were full on confrontational test. Facial sensation and strength were normal. hearing was intact to finger rubbing bilaterally. Uvula tongue midline. head  turning and shoulder shrug and were normal and symmetric.Tongue protrusion into cheek strength was normal. Motor: The motor testing reveals 5 over 5 strength of all 4 extremities. Good symmetric motor tone is noted throughout.  Sensory: Sensory testing is intact to soft touch on all 4 extremities. No evidence of extinction is noted.  Coordination: Cerebellar testing reveals good finger-nose-finger and heel-to-shin bilaterally.  Gait and station: Gait is normal. Tandem gait is normal. Romberg is negative.  Reflexes: Deep tendon reflexes are symmetric and normal bilaterally.   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 Significant vascular risk factors of hypertension, family history of stroke and borderline lipids.   PLAN:  Continue aspirin for stroke prevention restrict control of hypertension with blood pressure goal below 130/90.  Followup in 6 months, sooner as needed.  Philmore Pali, MSN, NP-C 10/05/2013, 8:40 AM [facility] [address] [place], [place] [address] [phone]  Note: This document was prepared with digital dictation and possible smart phrase technology. Any transcriptional errors that result from this process are unintentional.

## 2013-10-05 ENCOUNTER — Encounter: Payer: Self-pay | Admitting: Nurse Practitioner

## 2013-10-06 NOTE — Progress Notes (Signed)
I agree with above 

## 2013-10-07 ENCOUNTER — Encounter: Payer: Self-pay | Admitting: Internal Medicine

## 2013-10-12 ENCOUNTER — Ambulatory Visit (INDEPENDENT_AMBULATORY_CARE_PROVIDER_SITE_OTHER): Payer: BC Managed Care – PPO | Admitting: *Deleted

## 2013-10-12 DIAGNOSIS — I6350 Cerebral infarction due to unspecified occlusion or stenosis of unspecified cerebral artery: Secondary | ICD-10-CM

## 2013-10-12 LAB — MDC_IDC_ENUM_SESS_TYPE_REMOTE

## 2013-10-12 NOTE — Progress Notes (Signed)
I agree with above 

## 2013-10-14 NOTE — Progress Notes (Signed)
Loop recorder 

## 2013-11-13 ENCOUNTER — Encounter: Payer: Self-pay | Admitting: Internal Medicine

## 2013-11-13 ENCOUNTER — Ambulatory Visit (INDEPENDENT_AMBULATORY_CARE_PROVIDER_SITE_OTHER): Payer: BC Managed Care – PPO | Admitting: *Deleted

## 2013-11-13 DIAGNOSIS — I6350 Cerebral infarction due to unspecified occlusion or stenosis of unspecified cerebral artery: Secondary | ICD-10-CM

## 2013-11-13 LAB — MDC_IDC_ENUM_SESS_TYPE_REMOTE

## 2013-11-16 NOTE — Progress Notes (Signed)
Loop recorder 

## 2013-12-11 ENCOUNTER — Encounter: Payer: Self-pay | Admitting: Internal Medicine

## 2013-12-11 ENCOUNTER — Ambulatory Visit (INDEPENDENT_AMBULATORY_CARE_PROVIDER_SITE_OTHER): Payer: BC Managed Care – PPO | Admitting: *Deleted

## 2013-12-11 DIAGNOSIS — I6350 Cerebral infarction due to unspecified occlusion or stenosis of unspecified cerebral artery: Secondary | ICD-10-CM

## 2013-12-18 NOTE — Progress Notes (Signed)
Loop recorder 

## 2014-01-11 ENCOUNTER — Ambulatory Visit (INDEPENDENT_AMBULATORY_CARE_PROVIDER_SITE_OTHER): Payer: BC Managed Care – PPO | Admitting: *Deleted

## 2014-01-11 DIAGNOSIS — I6350 Cerebral infarction due to unspecified occlusion or stenosis of unspecified cerebral artery: Secondary | ICD-10-CM

## 2014-01-12 LAB — MDC_IDC_ENUM_SESS_TYPE_REMOTE

## 2014-01-14 NOTE — Progress Notes (Signed)
Loop recorder 

## 2014-01-26 LAB — MDC_IDC_ENUM_SESS_TYPE_REMOTE

## 2014-02-10 ENCOUNTER — Ambulatory Visit (INDEPENDENT_AMBULATORY_CARE_PROVIDER_SITE_OTHER): Payer: BC Managed Care – PPO | Admitting: *Deleted

## 2014-02-10 DIAGNOSIS — I639 Cerebral infarction, unspecified: Secondary | ICD-10-CM

## 2014-02-12 ENCOUNTER — Encounter: Payer: Self-pay | Admitting: *Deleted

## 2014-02-15 ENCOUNTER — Encounter: Payer: Self-pay | Admitting: Internal Medicine

## 2014-02-16 ENCOUNTER — Encounter: Payer: Self-pay | Admitting: Internal Medicine

## 2014-02-18 ENCOUNTER — Ambulatory Visit (INDEPENDENT_AMBULATORY_CARE_PROVIDER_SITE_OTHER): Payer: BC Managed Care – PPO | Admitting: Internal Medicine

## 2014-02-18 ENCOUNTER — Encounter: Payer: Self-pay | Admitting: Internal Medicine

## 2014-02-18 VITALS — BP 158/86 | HR 74 | Ht 65.5 in | Wt 144.6 lb

## 2014-02-18 DIAGNOSIS — I639 Cerebral infarction, unspecified: Secondary | ICD-10-CM

## 2014-02-18 DIAGNOSIS — G464 Cerebellar stroke syndrome: Secondary | ICD-10-CM

## 2014-02-18 DIAGNOSIS — I480 Paroxysmal atrial fibrillation: Secondary | ICD-10-CM

## 2014-02-18 DIAGNOSIS — Z959 Presence of cardiac and vascular implant and graft, unspecified: Secondary | ICD-10-CM

## 2014-02-18 DIAGNOSIS — I10 Essential (primary) hypertension: Secondary | ICD-10-CM

## 2014-02-18 NOTE — Progress Notes (Signed)
      Patient Care Team: [name], MD as PCP - General (Family Medicine)   HPI  Katrina Liu is a 53 y.o. female 1seen in followup for cryptogenic stroke for which she underwent loop recorder implantation 8/14; recent recordings demonstrated atrial fibrillation  She is feeling good without symptoms of shortness of breath or chest pain. Her blood pressures at home have been in the 130 range.  Past Medical History  Diagnosis Date  . Headache(784.0)   . Anemia   . GERD (gastroesophageal reflux disease)     h/o - not now  . S/P abdominal hysterectomy 04/17/2011  . Stroke   . Hypertension     Past Surgical History  Procedure Laterality Date  . Laparotomy      ectopic pregnancy  . Shoulder arthroscopy    . Kidney stone surgery    . Abdominal hysterectomy  04/16/2011    Procedure: HYSTERECTOMY ABDOMINAL;  Surgeon: Marvene Staff, MD;  Location: [facility];  Service: Gynecology;  Laterality: N/A;  . Tee without cardioversion N/A 12/24/2012    Procedure: TRANSESOPHAGEAL ECHOCARDIOGRAM (TEE);  Surgeon: Jettie Booze, MD;  Location: [facility];  Service: Cardiovascular;  Laterality: N/A;    Current Outpatient Prescriptions  Medication Sig Dispense Refill  . aspirin EC 81 MG EC tablet Take 1 tablet (81 mg total) by mouth daily.  90 tablet  0  . hydrochlorothiazide (HYDRODIURIL) 25 MG tablet Take 25 mg by mouth daily.      . meloxicam (MOBIC) 15 MG tablet Take 15 mg by mouth as needed for pain (for hands.).      [name] mometasone (ELOCON) 0.1 % ointment Apply 1 application topically as needed.       . Multiple Vitamins-Minerals (MULTIVITAMIN WITH MINERALS) tablet Take 1 tablet by mouth daily. Hair, Skin and Nails       No current facility-administered medications for this visit.    No Known Allergies  Review of Systems negative except from HPI and PMH  Physical Exam BP 158/86  Pulse 74  Ht 5' 5.5" (1.664 m)  Wt 144 lb 9.6 oz (65.59 kg)  BMI 23.69 kg/m2  LMP  03/17/2011 Well developed and nourished in no acute distress HENT normal Neck supple with JVP-flat Clear Regular rate and rhythm, no murmurs or gallops Abd-soft with active BS No Clubbing cyanosis edema Skin-warm and dry A & Oriented  Grossly normal sensory and motor function  ECG demonstrates sinus rhythm at 74 with intervals 15/09/50   Assessment and  Plan   cryptogenic stroke  Event recorder suggestive but not diagnostic of atrial fibrillation  Hypertension  Atrial fibrillation  We reviewed the event recorder strips today. I am concerned that they represent atrial fibrillation. Katrina Liu is reluctant to make change based on that; he agreed to regroup in about 2 months and continue to follow her event recorder strips a long. She is aware that anticoagulation would be far superior to aspirin if in fact atrial fibrillation is present

## 2014-02-18 NOTE — Patient Instructions (Signed)
Your physician recommends that you schedule a follow-up appointment in: 2 months with Dr. Caryl Comes.  Your physician recommends that you continue on your current medications as directed. Please refer to the Current Medication list given to you today.

## 2014-02-25 ENCOUNTER — Telehealth: Payer: Self-pay | Admitting: Internal Medicine

## 2014-02-25 LAB — MDC_IDC_ENUM_SESS_TYPE_REMOTE

## 2014-02-25 NOTE — Telephone Encounter (Signed)
Informed pt that her auto transmission from today shows that she has not had any episodes since 9/21. I asked her to send in a manual transmission since her last ofc interrogation was done in 02/2013 and the message on [facility] states that additional episodes may show up through a manual test. Patient voiced understanding and plans to send remote once she gets home.

## 2014-02-25 NOTE — Telephone Encounter (Signed)
New problem   Pt is feeling dizzy/heart racing and want to know if you received any notification from her pacer. Please advise pt

## 2014-02-26 NOTE — Progress Notes (Signed)
Loop recorder 

## 2014-03-12 ENCOUNTER — Ambulatory Visit (INDEPENDENT_AMBULATORY_CARE_PROVIDER_SITE_OTHER): Payer: BC Managed Care – PPO | Admitting: *Deleted

## 2014-03-12 DIAGNOSIS — I639 Cerebral infarction, unspecified: Secondary | ICD-10-CM

## 2014-03-12 LAB — MDC_IDC_ENUM_SESS_TYPE_REMOTE
Date Time Interrogation Session: 20151029223032
Zone Setting Detection Interval: 2000 ms
Zone Setting Detection Interval: 3000 ms
Zone Setting Detection Interval: 410 ms

## 2014-03-17 NOTE — Progress Notes (Signed)
Loop recorder 

## 2014-04-06 ENCOUNTER — Ambulatory Visit: Payer: BC Managed Care – PPO | Admitting: Neurology

## 2014-04-08 ENCOUNTER — Encounter (HOSPITAL_COMMUNITY): Payer: Self-pay | Admitting: Internal Medicine

## 2014-04-12 ENCOUNTER — Ambulatory Visit (INDEPENDENT_AMBULATORY_CARE_PROVIDER_SITE_OTHER): Payer: BC Managed Care – PPO | Admitting: *Deleted

## 2014-04-12 DIAGNOSIS — I639 Cerebral infarction, unspecified: Secondary | ICD-10-CM

## 2014-04-14 NOTE — Progress Notes (Signed)
Loop recorder 

## 2014-04-29 ENCOUNTER — Encounter: Payer: Self-pay | Admitting: Internal Medicine

## 2014-05-04 ENCOUNTER — Encounter: Payer: Self-pay | Admitting: Internal Medicine

## 2014-05-11 ENCOUNTER — Encounter: Payer: BC Managed Care – PPO | Admitting: Internal Medicine

## 2014-05-12 ENCOUNTER — Ambulatory Visit (INDEPENDENT_AMBULATORY_CARE_PROVIDER_SITE_OTHER): Payer: BLUE CROSS/BLUE SHIELD | Admitting: *Deleted

## 2014-05-12 DIAGNOSIS — I639 Cerebral infarction, unspecified: Secondary | ICD-10-CM

## 2014-05-12 LAB — MDC_IDC_ENUM_SESS_TYPE_REMOTE

## 2014-05-14 NOTE — Progress Notes (Signed)
Loop recorder 

## 2014-05-24 ENCOUNTER — Other Ambulatory Visit: Payer: Self-pay

## 2014-05-24 DIAGNOSIS — Z1231 Encounter for screening mammogram for malignant neoplasm of breast: Secondary | ICD-10-CM

## 2014-05-27 ENCOUNTER — Encounter: Payer: Self-pay | Admitting: Internal Medicine

## 2014-05-27 LAB — MDC_IDC_ENUM_SESS_TYPE_REMOTE

## 2014-06-03 ENCOUNTER — Ambulatory Visit (INDEPENDENT_AMBULATORY_CARE_PROVIDER_SITE_OTHER): Payer: BLUE CROSS/BLUE SHIELD | Admitting: Neurology

## 2014-06-03 ENCOUNTER — Encounter: Payer: Self-pay | Admitting: Neurology

## 2014-06-03 VITALS — BP 146/95 | HR 86 | Ht 65.0 in | Wt 143.6 lb

## 2014-06-03 DIAGNOSIS — G4700 Insomnia, unspecified: Secondary | ICD-10-CM

## 2014-06-03 DIAGNOSIS — I639 Cerebral infarction, unspecified: Secondary | ICD-10-CM

## 2014-06-03 NOTE — Progress Notes (Signed)
PATIENT: Katrina Liu DOB: 12-21-1960  REASON FOR VISIT: routine follow up for stroke HISTORY FROM: patient  HISTORY OF PRESENT ILLNESS: Katrina Liu is a 45 year African American lady seen today for first office followup visit for hospital admission for stroke on 12/22/12. She woke up with sudden onset of vertigo, nausea and numbness involving the right side of her face. Her symptoms persisted and she came to the hospital but presented be on time window for thrombolysis and with improving symptoms. MRI scan of the brain showed an chronic nonhemorrhagic right posterior inferior cerebral to artery infarct along with her acute component adjacent to it. MRA of the brain showed post inferior cerebral artery stenosis as well as hypoplastic vertebral arteries and basilar artery with persistent fetal origin for posterior cerebral arteries. Patient's symptoms quickly resolve after admission. Lipid profile showed LDL of 95. Hemoglobin A1c was 4.6. Carotid ultrasound showed no significant extracranial stenosis. Complement levels and hypercoagulable panel labs were normal except for borderline low protein S of 62 and low anticardiolipin antibody titres.  Trans-esophageal echocardiogram showed no cardiac source or patent foraminal ovale. There was mild focal aortic arch plaque noted. Transthoracic echo showed normal ejection fraction. Patient was started on aspirin for stroke prevention.  She also had loop recorder inserted to identify paroxysmal atrial fibrillation. She states she has done well and she has had no recurrent symptoms. Systolic blood pressure has been well. She has not had any arrythmia detected yet on the loop recorder. She has returned back to work without restrictions and has no complaints today. Negative Transcranial Doppler Bubble Study not indicative of right to left intracardiac shunt.   UPDATE 10/02/13 (LL):  Since last visit, she states she has been doing very well. She has not had any  reported arrhythmias with her loop recorder.  She exercises regularly with cardio and weights.  She has no new neurovascular symptoms.  She states her blood pressure is well controlled though it is 159/95 in the office today; she states she has not yet taken her HCTZ today.  She is tolerating aspirin well with no signs of significant bleeding or bruising.  She is bothered with insomnia and hot flashes, starting menopause, going to see her Gyn on Monday. Update 06/03/2014 : She returns for follow-up after last visit 8 months ago. She continues to do well without any recurrent stroke or TIA symptoms. She is tolerating aspirin well without bleeding, bruising or other side effects. She remains on diet control for lipids but her last lipid profile showed LDL of 94 a few months ago. She states her blood pressure is greater than is usually in the 130s/90s range. She has seen Dr. Caryl Comes for loop recorder follow-up and he was  apparently concerned about possible A. fib and she has an upcoming appointment soon to discuss this further she has no other new complaints. ROS:  14 system review of systems is positive for insomnia  and frequent waking only.  ALLERGIES: No Known Allergies  HOME MEDICATIONS: Outpatient Prescriptions Prior to Visit  Medication Sig Dispense Refill  . aspirin EC 81 MG EC tablet Take 1 tablet (81 mg total) by mouth daily. 90 tablet 0  . hydrochlorothiazide (HYDRODIURIL) 25 MG tablet Take 25 mg by mouth daily.    . mometasone (ELOCON) 0.1 % ointment Apply 1 application topically as needed.     . Multiple Vitamins-Minerals (MULTIVITAMIN WITH MINERALS) tablet Take 1 tablet by mouth daily. Hair, Skin and Nails    .  meloxicam (MOBIC) 15 MG tablet Take 15 mg by mouth as needed for pain (for hands.).     No facility-administered medications prior to visit.     PHYSICAL EXAM  Filed Vitals:   06/03/14 0838  BP: 146/95  Pulse: 86  Height: 5\' 5"  (1.651 m)  Weight: 143 lb 9.6 oz (65.137 kg)     Body mass index is 23.9 kg/(m^2). No exam data present   Generalized: Well developed middle aged African american lady, in no acute distress  Head: normocephalic and atraumatic.    Neck: Supple, no carotid bruits  Cardiac: Regular rate rhythm, no murmur  Musculoskeletal: No deformity   Neurological examination  Mentation: Alert oriented to time, place, history taking. Follows all commands speech and language fluent Cranial nerve II-XII: Pupils were equal round reactive to light extraocular movements were full, visual field were full on confrontational test. Facial sensation and strength were normal. hearing was intact to finger rubbing bilaterally. Uvula tongue midline. head turning and shoulder shrug and were normal and symmetric.Tongue protrusion into cheek strength was normal. Motor: The motor testing reveals 5 over 5 strength of all 4 extremities. Good symmetric motor tone is noted throughout.  Sensory: Sensory testing is intact to soft touch on all 4 extremities. No evidence of extinction is noted.  Coordination: Cerebellar testing reveals good finger-nose-finger and heel-to-shin bilaterally.  Gait and station: Gait is normal. Tandem gait is normal. Romberg is negative.  Reflexes: Deep tendon reflexes are symmetric and normal bilaterally.   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 Significant vascular risk factors of hypertension, family history of stroke and borderline lipids. She also has chronic insomnia  PLAN:  I had a long d/w patient about her remote stroke, risk for recurrent stroke/TIAs, personally independently reviewed imaging studies and stroke evaluation results and answered questions.Continue aspirin 81 mg orally every day  for secondary stroke prevention and maintain strict control of hypertension with blood pressure goal below 130/90, diabetes with hemoglobin A1c  goal below 6.5% and lipids with LDL cholesterol goal below 70 mg/dL. I also advised the patient to eat a healthy diet with plenty of whole grains, cereals, fruits and vegetables, exercise regularly and maintain ideal body weight. Check screening carotid ultrasound and transcranial Doppler studies. Recommend she repeat lipid profile in 2 months and if LDL is not optimal consider starting low-dose statin. Keep appointment with Dr. Caryl Comes electrophysiologist for loop recorder monitoring for paroxysmal atrial fibrillation and consider changing to anti coagulation if atrial fibrillation is confirmed. I also recommended she take melatonin 5 mg or Benadryl for her chronic insomnia. Followup in the future with me in 6 months.   Antony Contras, MD  06/03/2014, 10:22 AM [facility] [address] [place], [place] [address] [phone]  Note: This document was prepared with digital dictation and possible smart phrase technology. Any transcriptional errors that result from this process are unintentional.

## 2014-06-03 NOTE — Patient Instructions (Addendum)
I had a long d/w patient about her remote stroke, risk for recurrent stroke/TIAs, personally independently reviewed imaging studies and stroke evaluation results and answered questions.Continue aspirin 81 mg orally every day  for secondary stroke prevention and maintain strict control of hypertension with blood pressure goal below 130/90, diabetes with hemoglobin A1c goal below 6.5% and lipids with LDL cholesterol goal below 70 mg/dL. I also advised the patient to eat a healthy diet with plenty of whole grains, cereals, fruits and vegetables, exercise regularly and maintain ideal body weight. Check screening carotid ultrasound and transcranial Doppler studies. Recommend she repeat lipid profile in 2 months and if LDL is not optimal consider starting low-dose statin. Keep appointment with Dr. Caryl Comes electrophysiologist for loop recorder monitoring for paroxysmal atrial fibrillation. Followup in the future with me in 6 months.

## 2014-06-04 ENCOUNTER — Telehealth: Payer: Self-pay | Admitting: Radiology

## 2014-06-04 ENCOUNTER — Ambulatory Visit: Payer: BC Managed Care – PPO | Admitting: Neurology

## 2014-06-04 NOTE — Telephone Encounter (Signed)
Called cell & home- left message to return my call for scheduling carotid and TCD complete studies.

## 2014-06-07 ENCOUNTER — Ambulatory Visit
Admission: RE | Admit: 2014-06-07 | Discharge: 2014-06-07 | Disposition: A | Discharge status: Home / Self Care | Payer: BLUE CROSS/BLUE SHIELD | Source: Ambulatory Visit

## 2014-06-07 ENCOUNTER — Telehealth: Payer: Self-pay | Admitting: Internal Medicine

## 2014-06-07 ENCOUNTER — Ambulatory Visit (INDEPENDENT_AMBULATORY_CARE_PROVIDER_SITE_OTHER): Payer: BLUE CROSS/BLUE SHIELD | Admitting: Internal Medicine

## 2014-06-07 ENCOUNTER — Encounter: Payer: Self-pay | Admitting: Internal Medicine

## 2014-06-07 VITALS — BP 142/80 | HR 72 | Ht 65.0 in | Wt 142.6 lb

## 2014-06-07 DIAGNOSIS — Z1231 Encounter for screening mammogram for malignant neoplasm of breast: Secondary | ICD-10-CM

## 2014-06-07 DIAGNOSIS — I639 Cerebral infarction, unspecified: Secondary | ICD-10-CM

## 2014-06-07 DIAGNOSIS — Z4509 Encounter for adjustment and management of other cardiac device: Secondary | ICD-10-CM

## 2014-06-07 LAB — MDC_IDC_ENUM_SESS_TYPE_INCLINIC

## 2014-06-07 MED ORDER — CHLORTHALIDONE 25 MG PO TABS: 25.0000 mg | ORAL_TABLET | Freq: Every day | ORAL | Status: DC

## 2014-06-07 NOTE — Telephone Encounter (Signed)
Explained that insurance company will send [place] exception form/PA forms to fill out.  She understands

## 2014-06-07 NOTE — Progress Notes (Signed)
      Patient Care Team: [name], MD as PCP - General (Family Medicine)   HPI  Katrina Liu is a 54 y.o. female 1seen in followup for cryptogenic stroke for which she underwent loop recorder implantation 8/14; recent recordings demonstrated atrial fibrillation  She is feeling good without symptoms of shortness of breath or chest pain. Her blood pressures at home have been in the 130-40  range.  Past Medical History  Diagnosis Date  . Headache(784.0)   . Anemia   . GERD (gastroesophageal reflux disease)     h/o - not now  . S/P abdominal hysterectomy 04/17/2011  . Stroke   . Hypertension     Past Surgical History  Procedure Laterality Date  . Laparotomy      ectopic pregnancy  . Shoulder arthroscopy    . Kidney stone surgery    . Abdominal hysterectomy  04/16/2011    Procedure: HYSTERECTOMY ABDOMINAL;  Surgeon: Marvene Staff, MD;  Location: [facility];  Service: Gynecology;  Laterality: N/A;  . Tee without cardioversion N/A 12/24/2012    Procedure: TRANSESOPHAGEAL ECHOCARDIOGRAM (TEE);  Surgeon: Jettie Booze, MD;  Location: [facility];  Service: Cardiovascular;  Laterality: N/A;  . Loop recorder implant N/A 12/24/2012    Procedure: LOOP RECORDER IMPLANT;  Surgeon: Deboraha Sprang, MD;  Location: [facility] CATH LAB;  Service: Cardiovascular;  Laterality: N/A;    Current Outpatient Prescriptions  Medication Sig Dispense Refill  . aspirin EC 81 MG EC tablet Take 1 tablet (81 mg total) by mouth daily. 90 tablet 0  . Flaxseed, Linseed, (FLAX SEED OIL PO) Take 1 tablet by mouth daily.    . hydrochlorothiazide (HYDRODIURIL) 25 MG tablet Take 25 mg by mouth daily.    . mometasone (ELOCON) 0.1 % ointment Apply 1 application topically as needed (dry skin).     . Multiple Vitamins-Minerals (MULTIVITAMIN WITH MINERALS) tablet Take 1 tablet by mouth daily. Hair, Skin and Nails    . Omega-3 Fatty Acids (FISH OIL PO) Take 1 tablet by mouth daily.    [name] UNABLE TO FIND Take 1  tablet by mouth daily. Tumeric: sprinkles over food and 1 tablet daily     No current facility-administered medications for this visit.    No Known Allergies  Review of Systems negative except from HPI and PMH  Physical Exam BP 142/80 mmHg  Pulse 72  Ht 5\' 5"  (1.651 m)  Wt 142 lb 9.6 oz (64.683 kg)  BMI 23.73 kg/m2  LMP 03/17/2011 Well developed and nourished in no acute distress HENT normal Neck supple with JVP-flat Clear Regular rate and rhythm, no murmurs or gallops Abd-soft with active BS No Clubbing cyanosis edema Skin-warm and dry A & Oriented  Grossly normal sensory and motor function  ECG demonstrates sinus rhythm at 74 with intervals 15/09/50   Assessment and  Plan   cryptogenic stroke  Event recorder suggestive but not diagnostic of atrial fibrillation  Hypertension  Blood pressure somewhat elevated. A trial was released last week confirming/supporting the hypothesis that chlorthalidone is more effective than hydrochlorothiazide; we will make this change. Blood work is being followed by her primary care physician's  I've asked her to follow-up with Dr. Leonie Man regarding the role of anti-cholesterol therapy based on the SPARCL trial     We reviewed the event recorder strips today. I am less concerned than previously about this representing atrial fibrillation. We will continue to monitor it. Her blood pressure remains a little bit elevated.

## 2014-06-07 NOTE — Telephone Encounter (Signed)
New message     Patient was seen today would like to discuss tier medication.

## 2014-06-07 NOTE — Patient Instructions (Addendum)
Your physician has recommended you make the following change in your medication:  1) STOP Hydrochlorothiazide 2) START Chlorthalidone 25 mg daily  Your physician wants you to follow-up in: 1 year with Dr. Caryl Comes.  You will receive a reminder letter in the mail two months in advance. If you don't receive a letter, please call our office to schedule the follow-up appointment.  Chlorthalidone tablets What is this medicine? CHLORTHALIDONE (klor THAL i done) is a diuretic. It increases the amount of urine passed, which causes the body to lose salt and water. This medicine is used to treat high blood pressure and edema or water retention. This medicine may be used for other purposes; ask your health care provider or pharmacist if you have questions. COMMON BRAND NAME(S): Thalitone What should I tell my health care provider before I take this medicine? They need to know if you have any of these conditions: -asthma -diabetes -gout -kidney disease -liver disease -parathyroid disease -systemic lupus erythematosus (SLE) -taking cortisone, digoxin, lithium carbonate, or drugs for diabetes -an unusual or allergic reaction to chlorthalidone, sulfa drugs, other medicines, foods, dyes, or preservatives -pregnant or trying to get pregnant -breast-feeding How should I use this medicine? Take this medicine by mouth with a glass of water. Follow the directions on the prescription label. It is best to take your dose in the morning with food. Take your medicine at regular intervals. Do not take your medicine more often than directed. Do not stop taking except on your doctor's advice. Talk to your pediatrician regarding the use of this medicine in children. Special care may be needed. Overdosage: If you think you have taken too much of this medicine contact a poison control center or emergency room at once. NOTE: This medicine is only for you. Do not share this medicine with others. What if I miss a dose? If  you miss a dose, take it as soon as you can. If it is almost time for your next dose, take only that dose. Do not take double or extra doses. What may interact with this medicine? -barbiturate medicines for sleep or seizure control -digoxin -lithium -medicines for diabetes -norepinephrine -other medicines for high blood pressure -some pain medicines -steroid hormones like prednisone, cortisone, hydrocortisone, corticotropin -tubocurarine This list may not describe all possible interactions. Give your health care provider a list of all the medicines, herbs, non-prescription drugs, or dietary supplements you use. Also tell them if you smoke, drink alcohol, or use illegal drugs. Some items may interact with your medicine. What should I watch for while using this medicine? Visit your doctor or health care professional for regular check ups. Check your blood pressure as directed. Ask your doctor or health care professional what your blood pressure should be and when you should contact him or her. You may need to be on a special diet while taking this medicine. Ask your doctor. You may get drowsy or dizzy. Do not drive, use machinery, or do anything that needs mental alertness until you know how this medicine affects you. Do not stand or sit up quickly, especially if you are an older patient. This reduces the risk of dizzy or fainting spells. Alcohol may interfere with the effect of this medicine. Avoid alcoholic drinks. This medicine may affect your blood sugar level. If you have diabetes, check with your doctor or health care professional before changing the dose of your diabetic medicine. This medicine can make you more sensitive to the sun. Keep out of the sun.  If you cannot avoid being in the sun, wear protective clothing and use sunscreen. Do not use sun lamps or tanning beds/booths. What side effects may I notice from receiving this medicine? Side effects that you should report to your doctor or  health care professional as soon as possible: -allergic reactions like skin rash, itching or hives, swelling of the face, lips, or tongue -dark urine -dry mouth -excess thirst -fast, irregular heart rate -fever, chills -muscle pain, cramps, or spasm -nausea, vomiting -redness, blistering, peeling or loosening of the skin, including inside the mouth -tingling, pain or numbness in the hands or feet -unusually weak or tired -yellowing of the eyes or skin Side effects that usually do not require medical attention (report to your doctor or health care professional if they continue or are bothersome): -diarrhea or constipation -headache -impotence -loss of appetite -stomach upset This list may not describe all possible side effects. Call your doctor for medical advice about side effects. You may report side effects to FDA at 1-800-FDA-1088. Where should I keep my medicine? Keep out of the reach of children. Store at room temperature between 15 and 30 degrees C (59 and 86 degrees F). Keep container tightly closed. Throw away any unused medicine after the expiration date. NOTE: This sheet is a summary. It may not cover all possible information. If you have questions about this medicine, talk to your doctor, pharmacist, or health care provider.  2015, Elsevier/Gold Standard. (2007-07-22 15:28:48)

## 2014-06-08 ENCOUNTER — Telehealth: Payer: Self-pay | Admitting: Internal Medicine

## 2014-06-08 NOTE — Telephone Encounter (Signed)
New Msg          Pt c/o medication issue:  1. Name of Medication: chlorthalidone  2. How are you currently taking this medication (dosage and times per day)? Pt not sure  3. Are you having a reaction (difficulty breathing--STAT)? No  4. What is your medication issue? Pt was prescribed this medication yesterday at office visit and would like to take another medication that may be in a lower tier.  Pt states meds are expensive and would like a new med to take.   Please return pt [phone].

## 2014-06-09 ENCOUNTER — Encounter: Payer: Self-pay | Admitting: Internal Medicine

## 2014-06-09 ENCOUNTER — Ambulatory Visit (INDEPENDENT_AMBULATORY_CARE_PROVIDER_SITE_OTHER): Payer: BLUE CROSS/BLUE SHIELD

## 2014-06-09 DIAGNOSIS — Z0289 Encounter for other administrative examinations: Secondary | ICD-10-CM

## 2014-06-09 DIAGNOSIS — I639 Cerebral infarction, unspecified: Secondary | ICD-10-CM

## 2014-06-10 ENCOUNTER — Encounter: Payer: Self-pay | Admitting: Internal Medicine

## 2014-06-11 ENCOUNTER — Ambulatory Visit (INDEPENDENT_AMBULATORY_CARE_PROVIDER_SITE_OTHER): Payer: BLUE CROSS/BLUE SHIELD | Admitting: *Deleted

## 2014-06-11 DIAGNOSIS — I639 Cerebral infarction, unspecified: Secondary | ICD-10-CM

## 2014-06-11 LAB — MDC_IDC_ENUM_SESS_TYPE_REMOTE

## 2014-06-11 NOTE — Telephone Encounter (Signed)
Updated patient that we were waiting on Dr. Olin Pia recommendations. She verbalized understanding and continuing medication until she hears from [place].

## 2014-06-16 ENCOUNTER — Other Ambulatory Visit: Payer: BLUE CROSS/BLUE SHIELD

## 2014-06-16 NOTE — Progress Notes (Signed)
Loop recorder 

## 2014-06-22 MED ORDER — AMLODIPINE BESYLATE 5 MG PO TABS: 5.0000 mg | ORAL_TABLET | Freq: Every day | ORAL | Status: DC

## 2014-06-22 NOTE — Telephone Encounter (Signed)
Spoke with patient Friday to give her recommendation to start Lisinopril, per Dr. Caryl Comes.  She then tells me she cannot take that secondary to swelling allergy with that medication.  I added this to her allergy list. After reviewing again with Dr.Klein - orders to stop Chlorthalidone and start Amlodipine 5 mg daily. Rx sent to [facility] per pt request. Patient verbalized understanding and agreeable to plan. She will call me to let me know how she is doing.

## 2014-07-12 ENCOUNTER — Ambulatory Visit (INDEPENDENT_AMBULATORY_CARE_PROVIDER_SITE_OTHER): Payer: BLUE CROSS/BLUE SHIELD | Admitting: *Deleted

## 2014-07-12 DIAGNOSIS — I639 Cerebral infarction, unspecified: Secondary | ICD-10-CM

## 2014-07-14 ENCOUNTER — Encounter: Payer: Self-pay | Admitting: Internal Medicine

## 2014-07-15 ENCOUNTER — Encounter: Payer: Self-pay | Admitting: Internal Medicine

## 2014-07-15 NOTE — Progress Notes (Signed)
Loop recorder 

## 2014-07-20 ENCOUNTER — Encounter: Payer: Self-pay | Admitting: Internal Medicine

## 2014-07-27 LAB — MDC_IDC_ENUM_SESS_TYPE_REMOTE: MDC IDC SESS DTM: 20160323040500

## 2014-08-02 ENCOUNTER — Encounter: Payer: Self-pay | Admitting: Internal Medicine

## 2014-08-03 ENCOUNTER — Encounter: Payer: Self-pay | Admitting: Internal Medicine

## 2014-08-10 ENCOUNTER — Ambulatory Visit (INDEPENDENT_AMBULATORY_CARE_PROVIDER_SITE_OTHER): Payer: BLUE CROSS/BLUE SHIELD | Admitting: *Deleted

## 2014-08-10 DIAGNOSIS — I639 Cerebral infarction, unspecified: Secondary | ICD-10-CM | POA: Diagnosis not present

## 2014-08-12 NOTE — Progress Notes (Signed)
Loop recorder 

## 2014-09-03 LAB — CUP PACEART REMOTE DEVICE CHECK: Date Time Interrogation Session: 20160506133333

## 2014-09-08 ENCOUNTER — Telehealth: Payer: Self-pay | Admitting: *Deleted

## 2014-09-08 NOTE — Telephone Encounter (Signed)
Patient called and stated that the amlodipine has been working well for controlling her bp. The only complaint is that she has some light swelling in her feet. She would like to know if there is something else she can take or should she continue this? Please advise. Thanks, MI

## 2014-09-08 NOTE — Telephone Encounter (Signed)
Advised patient that Dr. Caryl Comes is out of the office until next week as he to leave urgently to fly to [place] for dad's surgery. Informed that since swelling is slight and no other complaints to continue medication and I will discuss with doctor next week when he returns. Patient verbalized understanding and agreeable to plan.

## 2014-09-09 ENCOUNTER — Ambulatory Visit (INDEPENDENT_AMBULATORY_CARE_PROVIDER_SITE_OTHER): Payer: BLUE CROSS/BLUE SHIELD | Admitting: *Deleted

## 2014-09-09 DIAGNOSIS — I639 Cerebral infarction, unspecified: Secondary | ICD-10-CM

## 2014-09-13 NOTE — Telephone Encounter (Signed)
Unfortunate side effect  lets try labetolol 100 bid

## 2014-09-13 NOTE — Telephone Encounter (Signed)
Unable to determine at this time who called patient. No telephone encounters noted in chart from this afternoon. Will route to Trinidad Curet, RN to follow up on 5/17.

## 2014-09-13 NOTE — Telephone Encounter (Signed)
Follow up      Pt said someone called her today around 4:30.  Ok to call tomorrow.

## 2014-09-14 ENCOUNTER — Telehealth: Payer: Self-pay | Admitting: Internal Medicine

## 2014-09-14 MED ORDER — LABETALOL HCL 100 MG PO TABS: 100.0000 mg | ORAL_TABLET | Freq: Two times a day (BID) | ORAL | Status: DC

## 2014-09-14 NOTE — Progress Notes (Signed)
Loop recorder 

## 2014-09-14 NOTE — Telephone Encounter (Signed)
See 5/11 telephone note for documentation on this

## 2014-09-14 NOTE — Telephone Encounter (Signed)
New message      Returning a nurses call.  She will be in and out today.  Please call cell phone number [phone]

## 2014-09-14 NOTE — Telephone Encounter (Signed)
Advised patient to stop Norvasc, start Labetalol 100 mg BID. Reviewed side effects and advised pt when to call doctor. Patient verbalized understanding and agreeable to plan.

## 2014-09-29 LAB — CUP PACEART REMOTE DEVICE CHECK: MDC IDC SESS DTM: 20160601162854

## 2014-10-05 IMAGING — CR DG CHEST 2V
2 series · 2 of 2 positions shown · non-contrast
Comparison: None.

CLINICAL DATA: Right facial numbness. Dizziness.

EXAM:
CHEST  2 VIEW

[w chest pa]
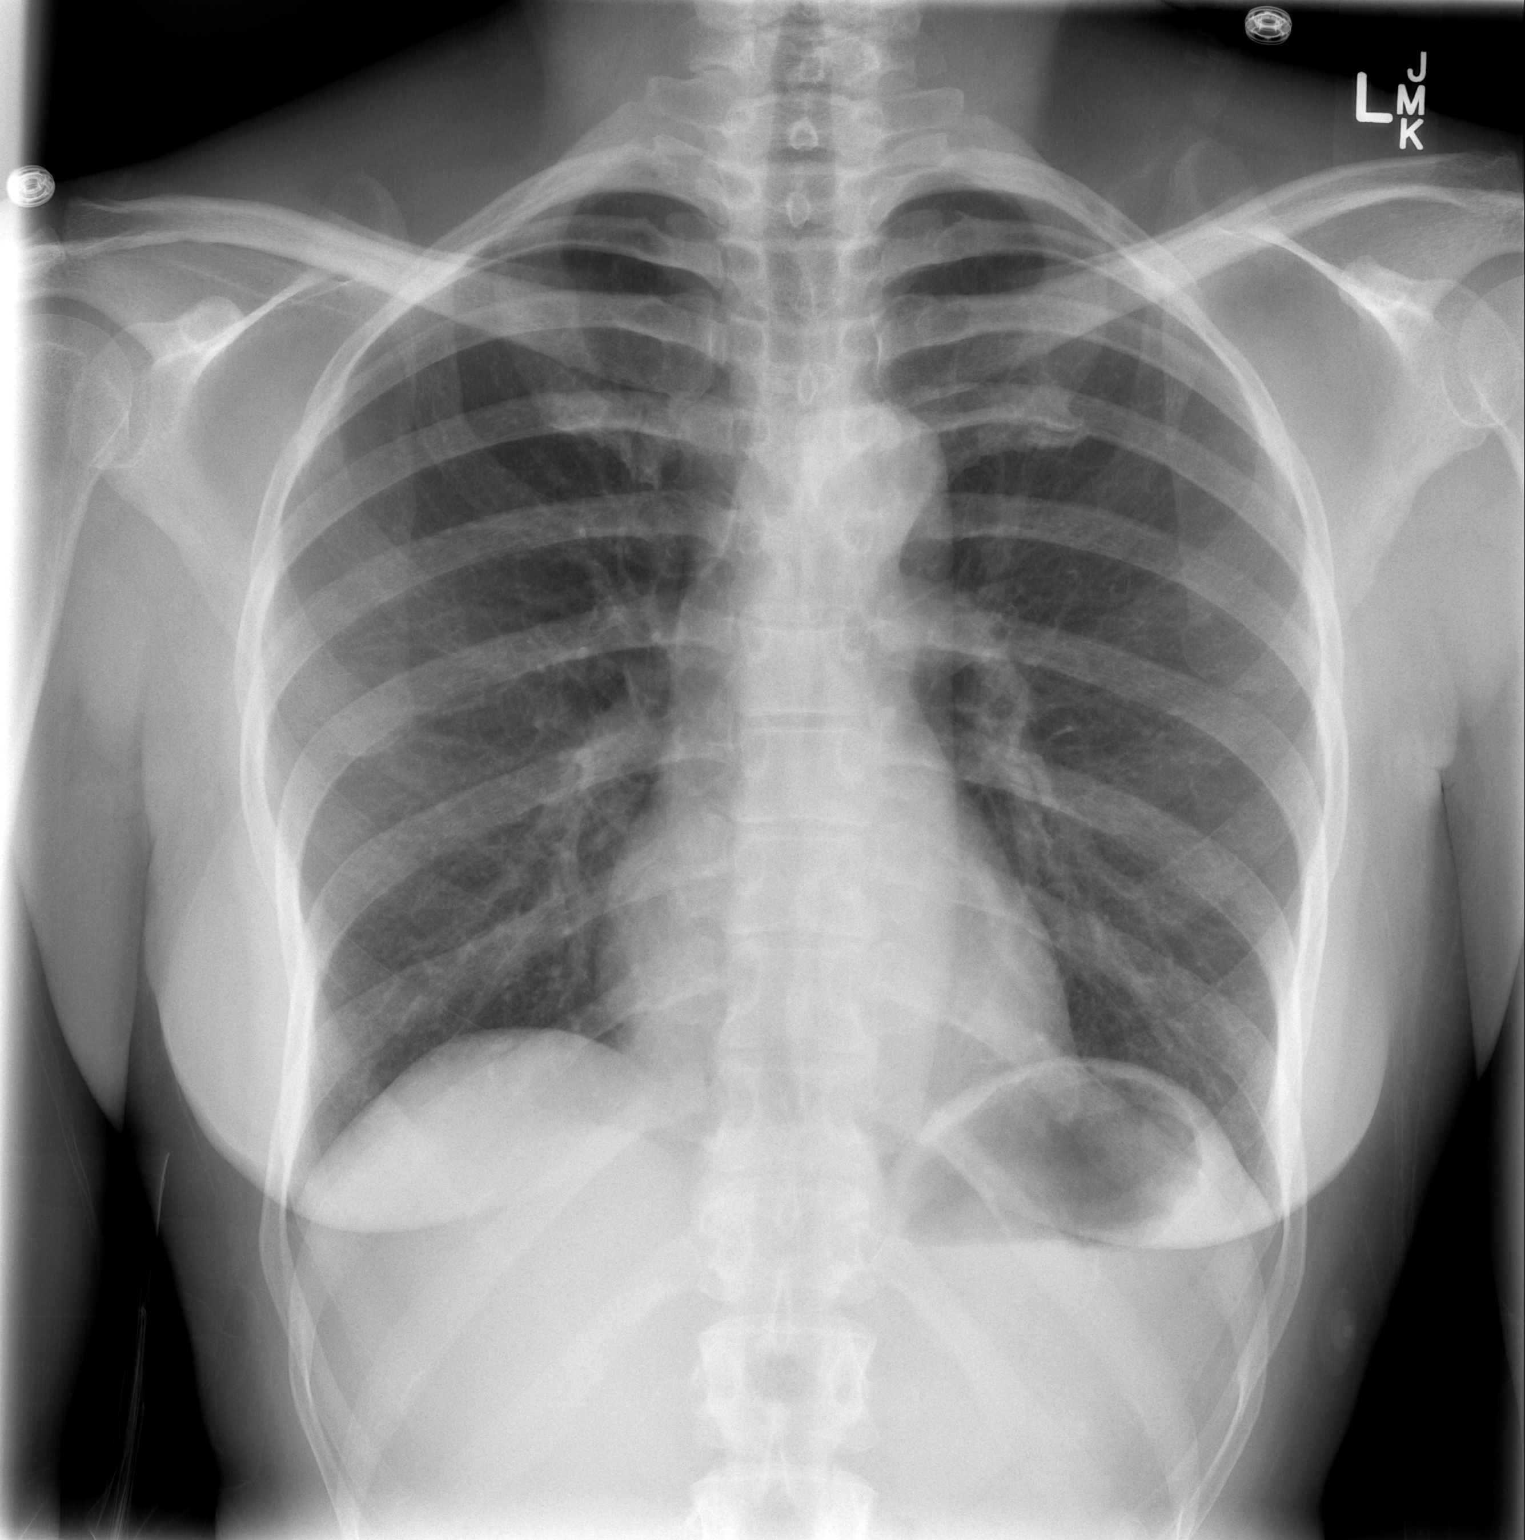

[w chest lat]
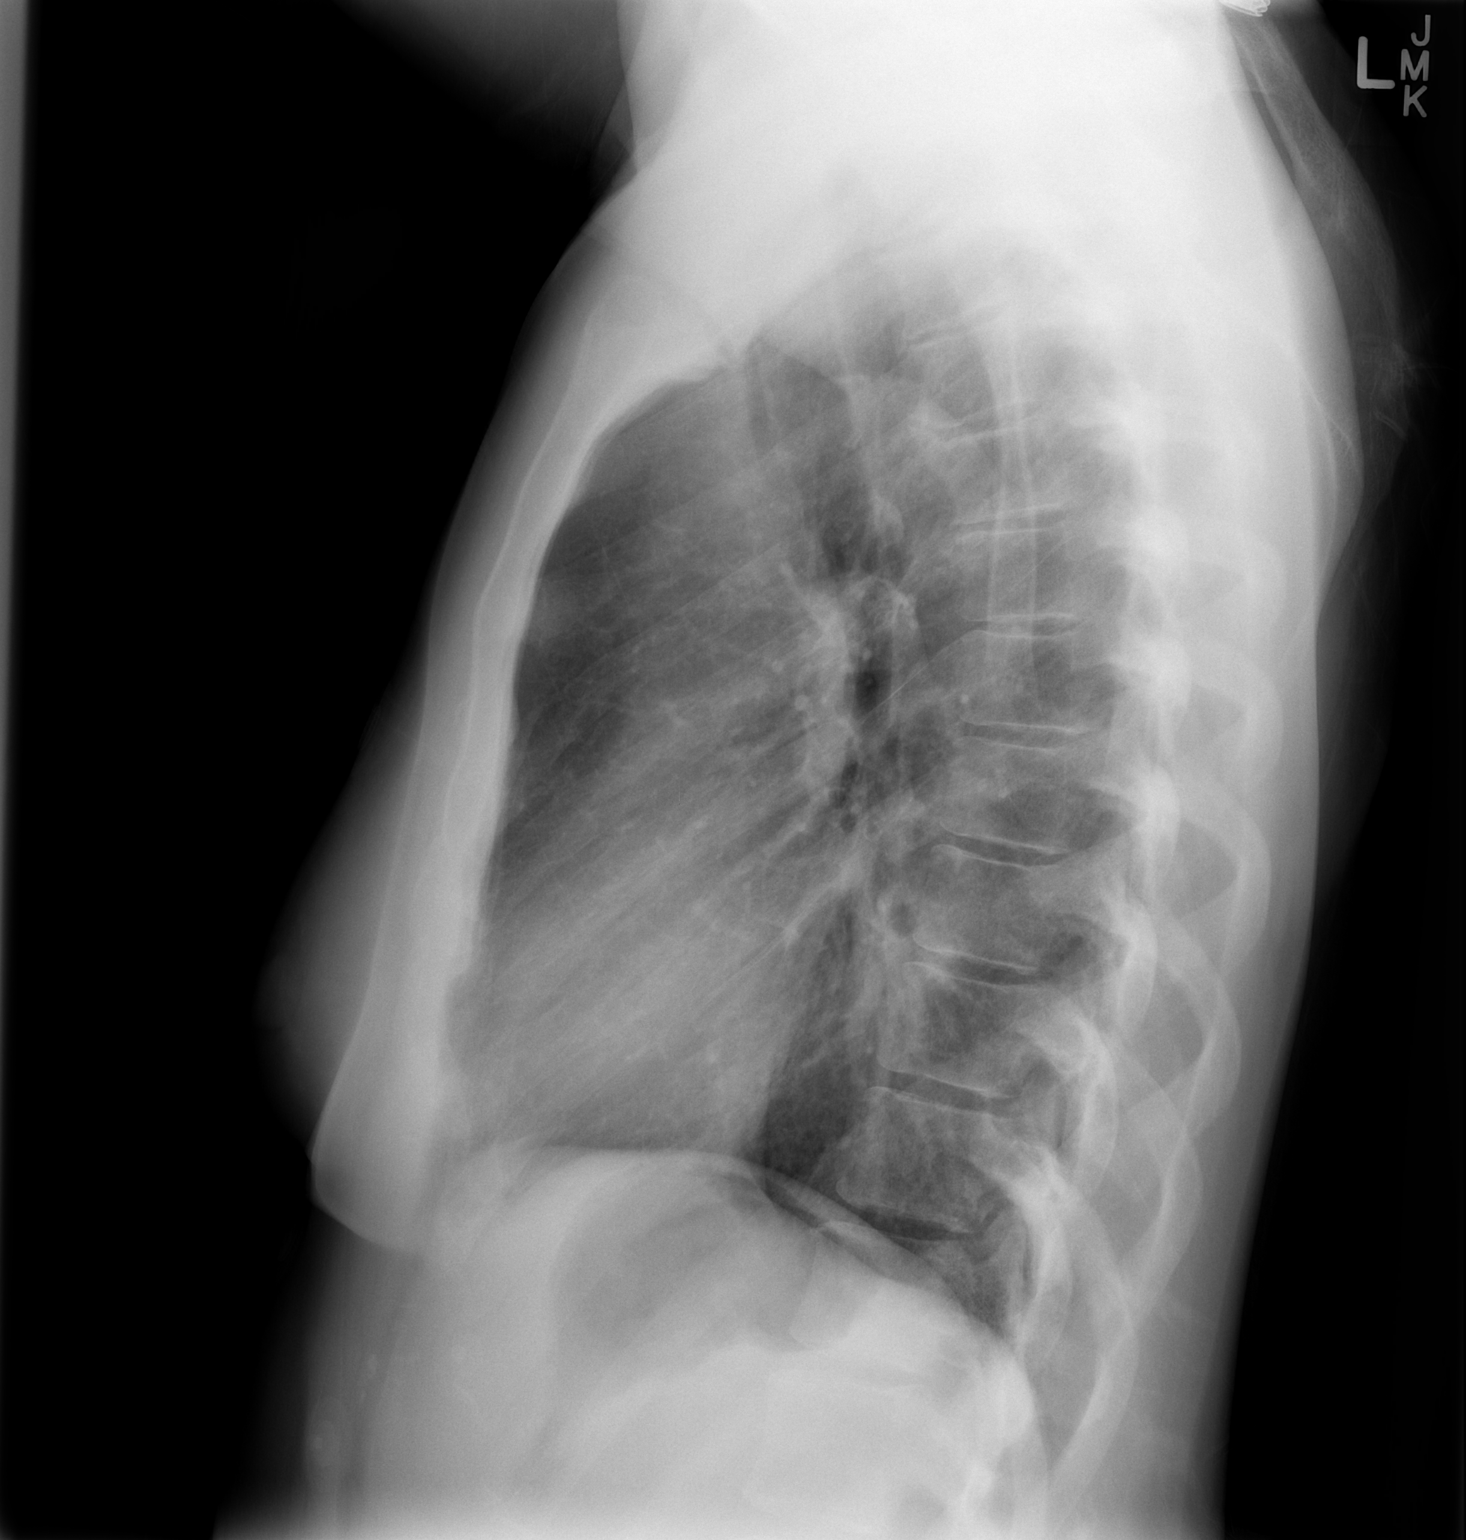

[2 of 2 positions shown; findings below may reference images not displayed]

FINDINGS: The heart size and mediastinal contours are within normal limits.
Both lungs are clear. The visualized skeletal structures are
unremarkable.
IMPRESSION: Normal examination.

## 2014-10-05 IMAGING — CT CT HEAD W/O CM
2 series · 16 of 30 positions shown, 20 images · non-contrast
Comparison: No priors.

CLINICAL DATA: Right-sided facial droop.  Slurred speech.

CT HEAD WITHOUT CONTRAST
TECHNIQUE: Contiguous axial images were obtained from the base of
the skull through the vertex without contrast.

[Series 2: head w/o · axial · non-contrast · 0.49mm/px · z∈[+125,+250]mm · 13 of 31 slices shown, 17 images]
[im 3/31  brain]
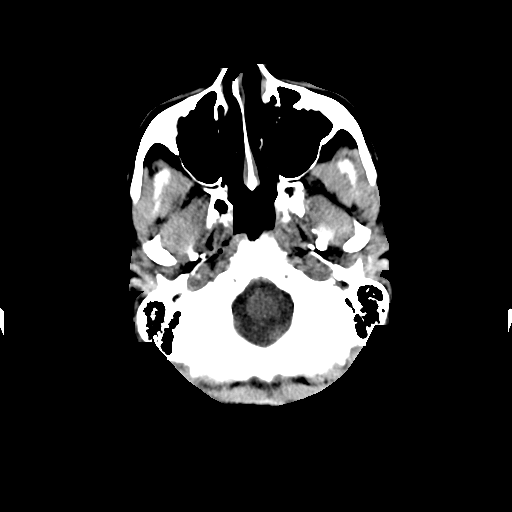
[im 3/31  bone]
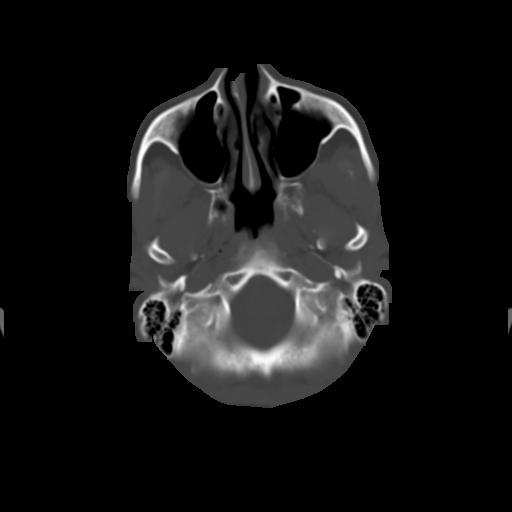
[im 5/31  brain]
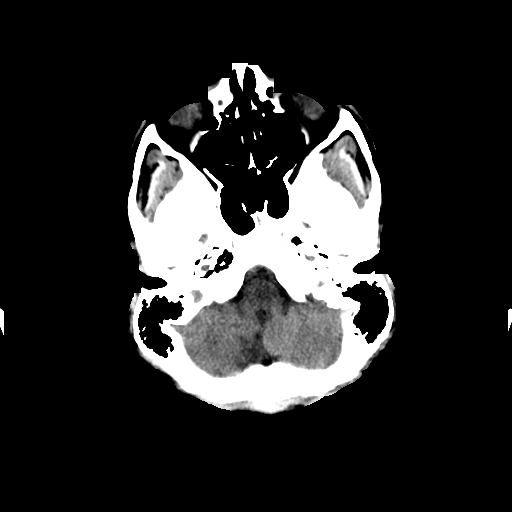
[im 7/31  brain]
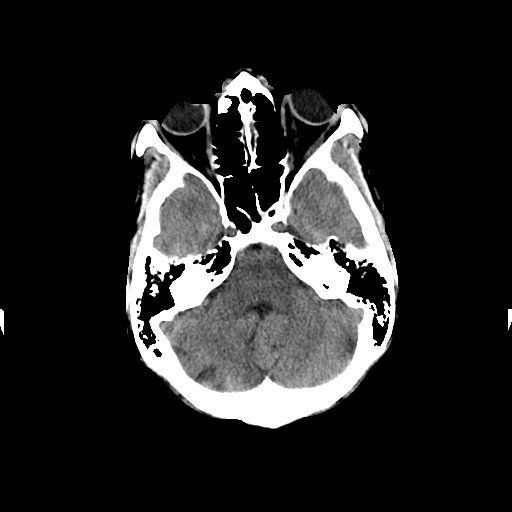
[im 9/31  brain]
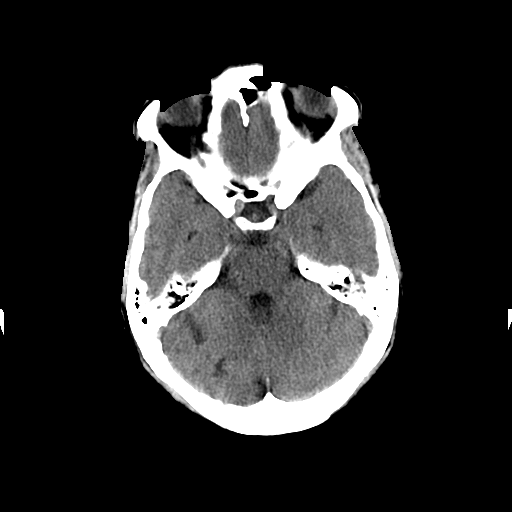
[im 11/31  brain]
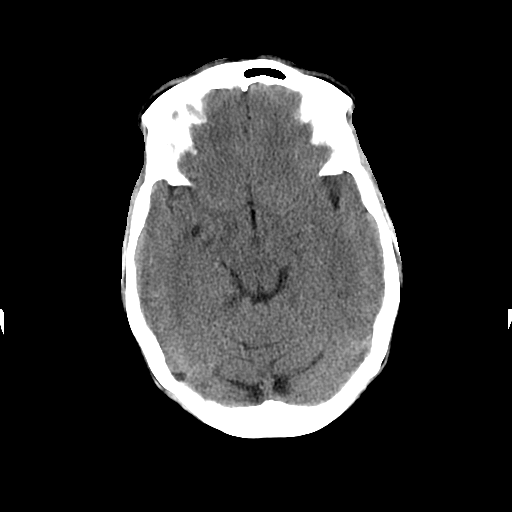
[im 11/31  bone]
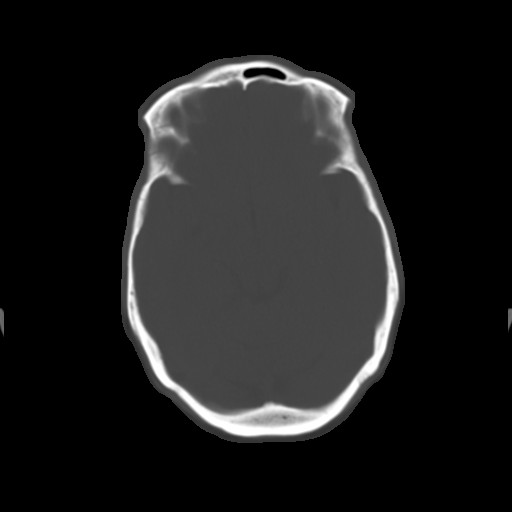
[im 13/31  brain]
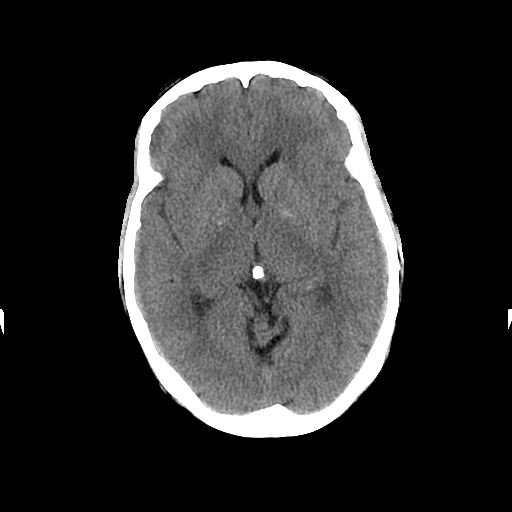
[im 16/31  brain]
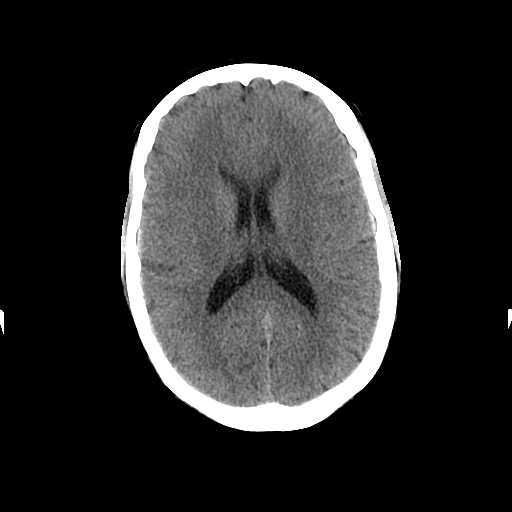
[im 18/31  brain]
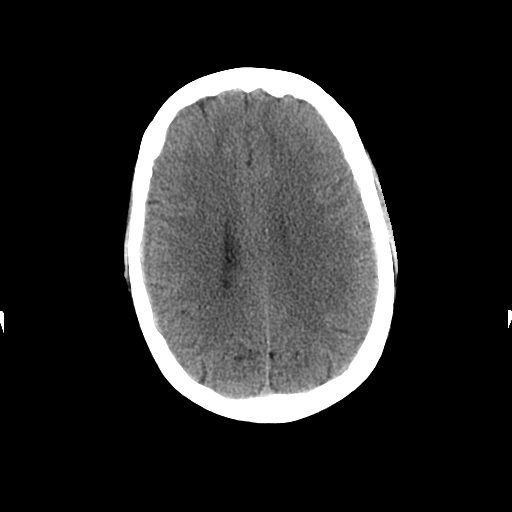
[im 20/31  brain]
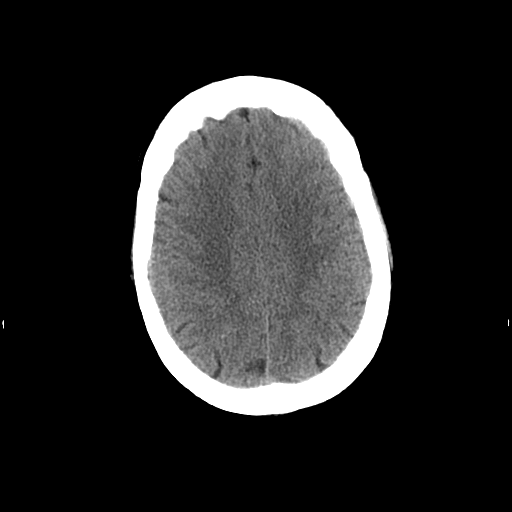
[im 20/31  bone]
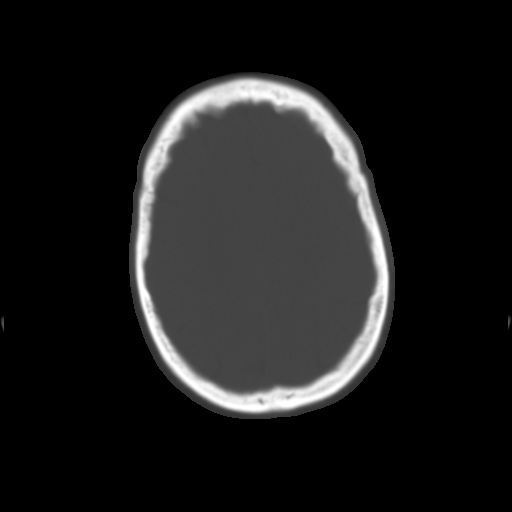
[im 22/31  brain]
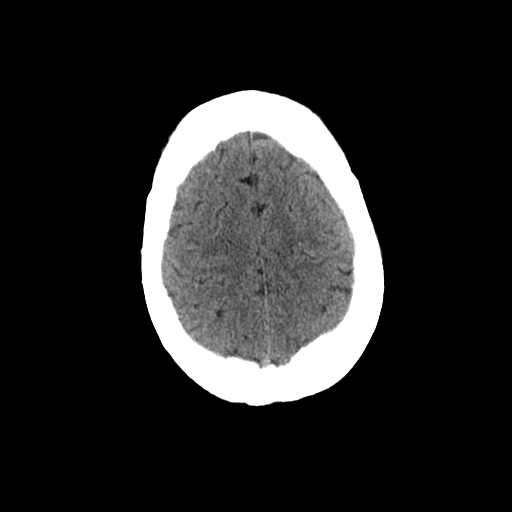
[im 24/31  brain]
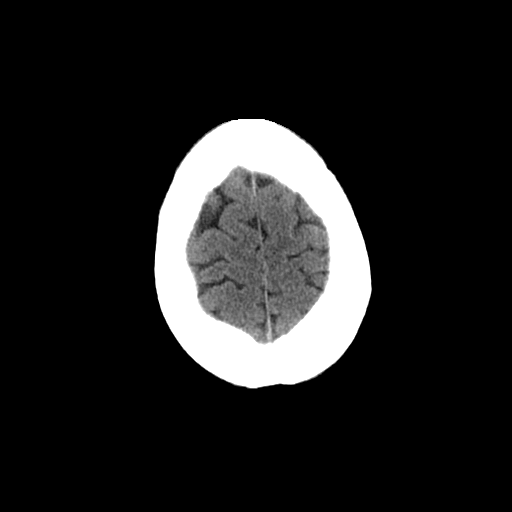
[im 26/31  brain]
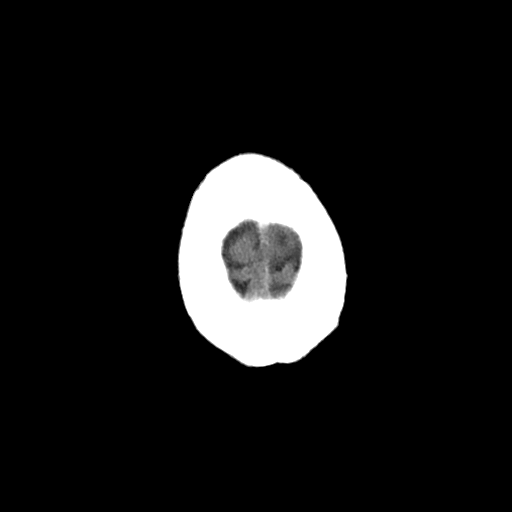
[im 28/31  brain]
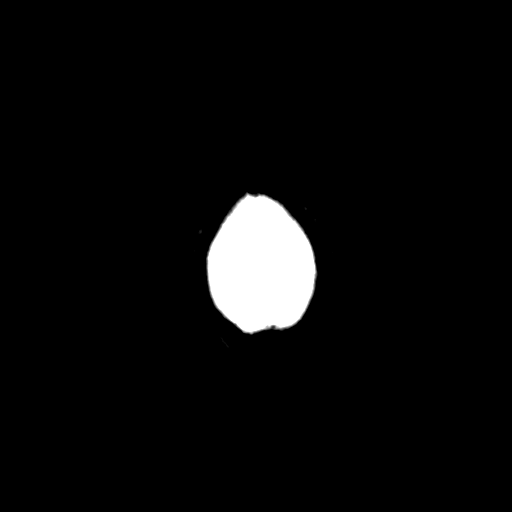
[im 28/31  bone]
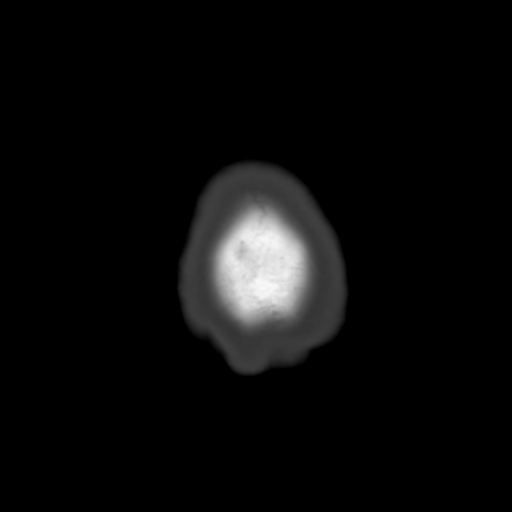

[Series 3: head w/o bone · axial · non-contrast · 0.49mm/px · z∈[+125,+165]mm · 3 of 31 slices shown]
[im 3/31  bone]
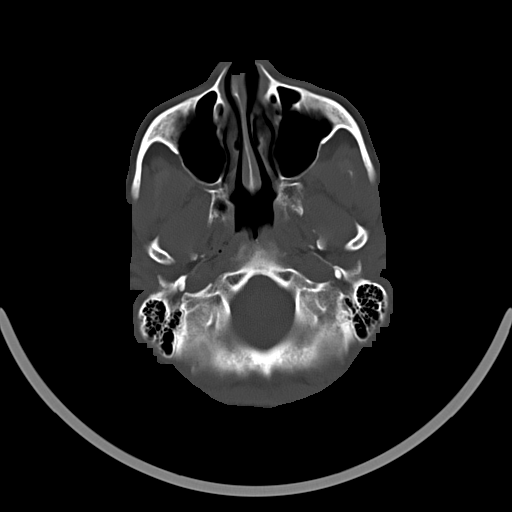
[im 7/31  bone]
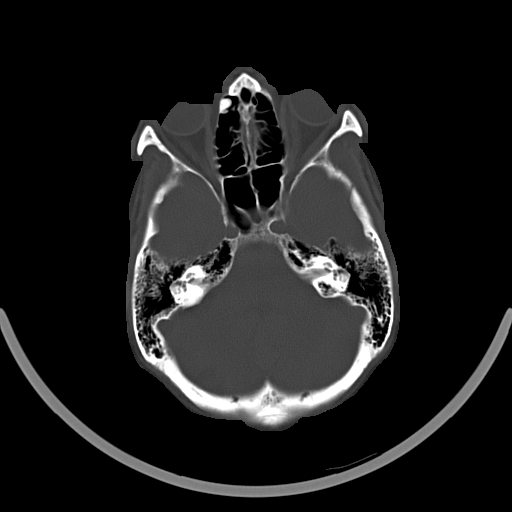
[im 11/31  bone]
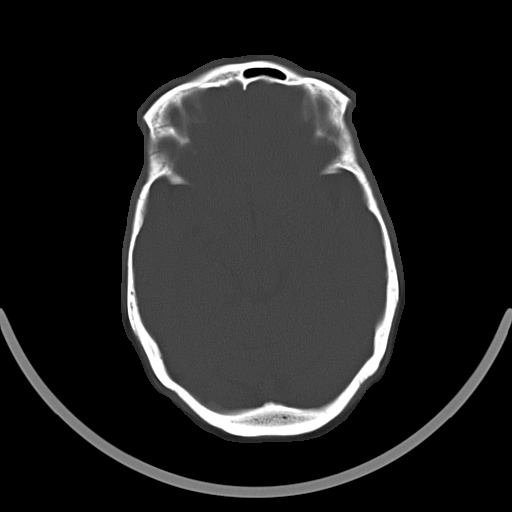

[16 of 30 positions shown; findings below may reference images not displayed]

FINDINGS: Faint physiologic calcifications are noted within the
basal ganglia bilaterally. Multiple well-defined areas of low
attenuation in the right cerebellar hemisphere are compatible with
old infarctions. No acute intracranial abnormalities.
Specifically, no evidence of acute intracranial hemorrhage, no
definite findings of acute/subacute cerebral ischemia, no mass,
mass effect, hydrocephalus or abnormal intra or extra-axial fluid
collections.  Visualized paranasal sinuses and mastoids are well
pneumatized.  No acute displaced skull fractures are identified.
IMPRESSION: 1.  No acute intracranial abnormalities.
2.  Multifocal old infarcts within the right cerebellar hemisphere.

## 2014-10-06 ENCOUNTER — Encounter: Payer: Self-pay | Admitting: Internal Medicine

## 2014-10-08 ENCOUNTER — Ambulatory Visit (INDEPENDENT_AMBULATORY_CARE_PROVIDER_SITE_OTHER): Payer: BLUE CROSS/BLUE SHIELD | Admitting: *Deleted

## 2014-10-08 DIAGNOSIS — G464 Cerebellar stroke syndrome: Secondary | ICD-10-CM

## 2014-10-08 DIAGNOSIS — I639 Cerebral infarction, unspecified: Secondary | ICD-10-CM

## 2014-10-11 LAB — CUP PACEART REMOTE DEVICE CHECK: MDC IDC SESS DTM: 20160613163128

## 2014-10-11 NOTE — Progress Notes (Signed)
Loop recorder 

## 2014-11-08 ENCOUNTER — Ambulatory Visit (INDEPENDENT_AMBULATORY_CARE_PROVIDER_SITE_OTHER): Payer: Managed Care, Other (non HMO) | Admitting: *Deleted

## 2014-11-08 DIAGNOSIS — I639 Cerebral infarction, unspecified: Secondary | ICD-10-CM | POA: Diagnosis not present

## 2014-11-09 NOTE — Progress Notes (Signed)
Loop recorder 

## 2014-11-16 ENCOUNTER — Encounter: Payer: Self-pay | Admitting: Internal Medicine

## 2014-11-22 ENCOUNTER — Encounter: Payer: Self-pay | Admitting: Internal Medicine

## 2014-11-29 ENCOUNTER — Telehealth: Payer: Self-pay | Admitting: Internal Medicine

## 2014-11-29 MED ORDER — HYDROCHLOROTHIAZIDE 12.5 MG PO CAPS: 12.5000 mg | ORAL_CAPSULE | Freq: Every day | ORAL | Status: DC

## 2014-11-29 NOTE — Telephone Encounter (Signed)
New message     Pt c/o swelling: STAT is pt has developed SOB within 24 hours  1. How long have you been experiencing swelling? 4 weeks  2. Where is the swelling located? feet  3.  Are you currently taking a "fluid pill"? No  4.  Are you currently SOB? No  5.  Have you traveled recently?No  Pt states legs are achy in the morning  Please call to discuss

## 2014-11-29 NOTE — Telephone Encounter (Signed)
**Note De-Identified Katrina Liu Obfuscation** The pt is advised per Dr Johnsie Cancel, DOD, to start taking Hydrochlorothiazide 12.5 mg daily. The pt verbalized understanding and agrees with plan.   Per her request I sent RX for Hydrochlorothiazide to [facility] to fill.

## 2014-11-29 NOTE — Telephone Encounter (Addendum)
The pt states that she was advised to take Amlodipine on 2/23 but because she had light swelling in her feet she was advised on 5/11 to stop taking Amlodipine and start taking Labetalol 100 mg BID. She called this am with c/o swelling in her feet and morning leg pain. She reports that her shoes are tight. She states that she weighed 141 lbs about a month ago and that she weighs 146 lbs at this time. She denies SOB. She is advised that I will discuss with our DOD and call her back. She verbalized understanding.

## 2014-12-03 ENCOUNTER — Encounter: Payer: Self-pay | Admitting: Neurology

## 2014-12-03 ENCOUNTER — Ambulatory Visit (INDEPENDENT_AMBULATORY_CARE_PROVIDER_SITE_OTHER): Payer: Managed Care, Other (non HMO) | Admitting: Neurology

## 2014-12-03 VITALS — BP 150/85 | HR 69 | Ht 65.0 in | Wt 147.4 lb

## 2014-12-03 DIAGNOSIS — I6990 Unspecified sequelae of unspecified cerebrovascular disease: Secondary | ICD-10-CM | POA: Diagnosis not present

## 2014-12-03 NOTE — Patient Instructions (Signed)
I had a long d/w patient about her remote stroke, risk for recurrent stroke/TIAs, personally independently reviewed imaging studies and stroke evaluation results and answered questions.Continue aspirin 81 mg orally every day  for secondary stroke prevention and maintain strict control of hypertension with blood pressure goal below 130/90, diabetes with hemoglobin A1c goal below 6.5% and lipids with LDL cholesterol goal below 100 mg/dL. I also advised the patient to eat a healthy diet with plenty of whole grains, cereals, fruits and vegetables, exercise regularly and maintain ideal body weight. Since the patient is doing well and has been free of stroke symptoms for 2 years I recommend no routine follow-up appointment with me is necessary. She may return for follow-up in the future only if needed.

## 2014-12-03 NOTE — Progress Notes (Signed)
PATIENT: Katrina Liu DOB: Feb 25, 1961  REASON FOR VISIT: routine follow up for stroke HISTORY FROM: patient  HISTORY OF PRESENT ILLNESS: Katrina Liu is a 60 year African American lady seen today for first office followup visit for hospital admission for stroke on 12/22/12. She woke up with sudden onset of vertigo, nausea and numbness involving the right side of her face. Her symptoms persisted and she came to the hospital but presented be on time window for thrombolysis and with improving symptoms. MRI scan of the brain showed an chronic nonhemorrhagic right posterior inferior cerebral to artery infarct along with her acute component adjacent to it. MRA of the brain showed post inferior cerebral artery stenosis as well as hypoplastic vertebral arteries and basilar artery with persistent fetal origin for posterior cerebral arteries. Patient's symptoms quickly resolve after admission. Lipid profile showed LDL of 95. Hemoglobin A1c was 4.6. Carotid ultrasound showed no significant extracranial stenosis. Complement levels and hypercoagulable panel labs were normal except for borderline low protein S of 62 and low anticardiolipin antibody titres.  Trans-esophageal echocardiogram showed no cardiac source or patent foraminal ovale. There was mild focal aortic arch plaque noted. Transthoracic echo showed normal ejection fraction. Patient was started on aspirin for stroke prevention.  She also had loop recorder inserted to identify paroxysmal atrial fibrillation. She states she has done well and she has had no recurrent symptoms. Systolic blood pressure has been well. She has not had any arrythmia detected yet on the loop recorder. She has returned back to work without restrictions and has no complaints today. Negative Transcranial Doppler Bubble Study not indicative of right to left intracardiac shunt.   UPDATE 10/02/13 (LL):  Since last visit, she states she has been doing very well. She has not had any  reported arrhythmias with her loop recorder.  She exercises regularly with cardio and weights.  She has no new neurovascular symptoms.  She states her blood pressure is well controlled though it is 159/95 in the office today; she states she has not yet taken her HCTZ today.  She is tolerating aspirin well with no signs of significant bleeding or bruising.  She is bothered with insomnia and hot flashes, starting menopause, going to see her Gyn on Monday. Update 06/03/2014 : She returns for follow-up after last visit 8 months ago. She continues to do well without any recurrent stroke or TIA symptoms. She is tolerating aspirin well without bleeding, bruising or other side effects. She remains on diet control for lipids but her last lipid profile showed LDL of 94 a few months ago. She states her blood pressure is greater than is usually in the 130s/90s range. She has seen Dr. Caryl Comes for loop recorder follow-up and he was  apparently concerned about possible A. fib and she has an upcoming appointment soon to discuss this further she has no other new complaints. Update 12/03/2014 : She returns for follow-up after last visit 6 months ago. She continues to do well without recurrent stroke or TIA symptoms now for 2 years. She remains on aspirin which is starting well without significant bleeding or bruising. Her blood pressure medications have recently been changed by Dr. Caryl Comes and she is now on hydrochlorothiazide 12.5 as well as labetalol 100 twice daily. Blood pressure is doing better. She also has adequate diet control for her hyperlipidemia and last lipid profile showed LDL of 80. She's not on statins. She hasn't follow-up carotid ultrasound done on 06/09/14 which have reviewed and shows no  significant extracranial stenosis. Transcranial Doppler studies showed mild velocity elevations in the left M1 and M2 middle cerebral arteries indicative of mild stenosis only. ROS:  14 system review of systems is positive for insomnia   and restless legs only and all other systems negative.  ALLERGIES: Allergies  Allergen Reactions  . Lisinopril Swelling    HOME MEDICATIONS: Outpatient Prescriptions Prior to Visit  Medication Sig Dispense Refill  . aspirin EC 81 MG EC tablet Take 1 tablet (81 mg total) by mouth daily. 90 tablet 0  . Flaxseed, Linseed, (FLAX SEED OIL PO) Take 1 tablet by mouth daily.    . hydrochlorothiazide (MICROZIDE) 12.5 MG capsule Take 1 capsule (12.5 mg total) by mouth daily. 30 capsule 3  . labetalol (NORMODYNE) 100 MG tablet Take 1 tablet (100 mg total) by mouth 2 (two) times daily. 60 tablet 3  . mometasone (ELOCON) 0.1 % ointment Apply 1 application topically as needed (dry skin).     . Multiple Vitamins-Minerals (MULTIVITAMIN WITH MINERALS) tablet Take 1 tablet by mouth daily. Hair, Skin and Nails    . Omega-3 Fatty Acids (FISH OIL PO) Take 1 tablet by mouth daily.    [name] UNABLE TO FIND Take 1 tablet by mouth daily. Tumeric: sprinkles over food and 1 tablet daily     No facility-administered medications prior to visit.     PHYSICAL EXAM  Filed Vitals:   12/03/14 0850  BP: 150/85  Pulse: 69  Height: 5\' 5"  (1.651 m)  Weight: 147 lb 6.4 oz (66.86 kg)   Body mass index is 24.53 kg/(m^2). No exam data present   Generalized: Well developed middle aged African american lady, in no acute distress  Head: normocephalic and atraumatic.    Neck: Supple, no carotid bruits  Cardiac: Regular rate rhythm, no murmur  Musculoskeletal: No deformity   Neurological examination  Mentation: Alert oriented to time, place, history taking. Follows all commands speech and language fluent Cranial nerve II-XII: Pupils were equal round reactive to light extraocular movements were full, visual field were full on confrontational test. Facial sensation and strength were normal. hearing was intact to finger rubbing bilaterally. Uvula tongue midline. head turning and shoulder shrug and were normal and  symmetric.Tongue protrusion into cheek strength was normal. Motor: The motor testing reveals 5 over 5 strength of all 4 extremities. Good symmetric motor tone is noted throughout.  Sensory: Sensory testing is intact to soft touch on all 4 extremities. No evidence of extinction is noted.  Coordination: Cerebellar testing reveals good finger-nose-finger and heel-to-shin bilaterally.  Gait and station: Gait is normal. Tandem gait is normal. Romberg is negative.  Reflexes: Deep tendon reflexes are symmetric and normal bilaterally.   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 Significant vascular risk factors of hypertension, family history of stroke and borderline lipids. She also has chronic insomnia  PLAN:   I had a long d/w patient about her remote stroke, risk for recurrent stroke/TIAs, personally independently reviewed imaging studies and stroke evaluation results and answered questions.Continue aspirin 81 mg orally every day  for secondary stroke prevention and maintain strict control of hypertension with blood pressure goal below 130/90, diabetes with hemoglobin A1c goal below 6.5% and lipids with LDL cholesterol goal below 100 mg/dL. I also advised the patient to eat a healthy diet with plenty of whole grains, cereals, fruits and vegetables, exercise regularly and maintain ideal body weight. Since the patient  is doing well and has been free of stroke symptoms for 2 years I recommend no routine follow-up appointment with me is necessary. She was advised to continue follow-up with Dr. Caryl Comes as well as her primary physician. She may return for follow-up in the future only if needed.    Antony Contras, MD  12/03/2014, 9:11 AM [facility] [address], [place], [place] [address] [phone]  Note: This document was prepared with digital dictation and possible smart  phrase technology. Any transcriptional errors that result from this process are unintentional.

## 2014-12-06 LAB — CUP PACEART REMOTE DEVICE CHECK
Date Time Interrogation Session: 20160711234659
MDC IDC SET ZONE DETECTION INTERVAL: 3000 ms
Zone Setting Detection Interval: 2000 ms
Zone Setting Detection Interval: 410 ms

## 2014-12-08 ENCOUNTER — Ambulatory Visit (INDEPENDENT_AMBULATORY_CARE_PROVIDER_SITE_OTHER): Payer: Managed Care, Other (non HMO) | Admitting: *Deleted

## 2014-12-08 DIAGNOSIS — I639 Cerebral infarction, unspecified: Secondary | ICD-10-CM

## 2014-12-08 LAB — CUP PACEART REMOTE DEVICE CHECK: Date Time Interrogation Session: 20160810113246

## 2014-12-09 NOTE — Progress Notes (Signed)
Loop recorder 

## 2015-01-07 ENCOUNTER — Ambulatory Visit (INDEPENDENT_AMBULATORY_CARE_PROVIDER_SITE_OTHER): Payer: Managed Care, Other (non HMO) | Admitting: *Deleted

## 2015-01-07 DIAGNOSIS — I639 Cerebral infarction, unspecified: Secondary | ICD-10-CM | POA: Diagnosis not present

## 2015-01-10 NOTE — Progress Notes (Signed)
Loop recorder 

## 2015-01-19 ENCOUNTER — Encounter: Payer: Self-pay | Admitting: Internal Medicine

## 2015-01-24 LAB — CUP PACEART REMOTE DEVICE CHECK: Date Time Interrogation Session: 20160909083559

## 2015-01-24 NOTE — Progress Notes (Signed)
Carelink summary report received. Battery status OK. Normal device function. No new symptom episodes, tachy episodes, brady, or pause episodes. No new AF episodes. Monthly summary reports and ROV with SK in 06/2015. 

## 2015-01-31 ENCOUNTER — Telehealth: Payer: Self-pay | Admitting: Internal Medicine

## 2015-01-31 ENCOUNTER — Other Ambulatory Visit: Payer: Self-pay | Admitting: *Deleted

## 2015-01-31 MED ORDER — LABETALOL HCL 100 MG PO TABS: 100.0000 mg | ORAL_TABLET | Freq: Two times a day (BID) | ORAL | Status: DC

## 2015-02-07 ENCOUNTER — Encounter: Payer: Self-pay | Admitting: Internal Medicine

## 2015-02-07 ENCOUNTER — Ambulatory Visit (INDEPENDENT_AMBULATORY_CARE_PROVIDER_SITE_OTHER): Payer: Managed Care, Other (non HMO) | Admitting: *Deleted

## 2015-02-07 DIAGNOSIS — I639 Cerebral infarction, unspecified: Secondary | ICD-10-CM

## 2015-02-08 ENCOUNTER — Encounter: Payer: Self-pay | Admitting: Internal Medicine

## 2015-02-08 NOTE — Progress Notes (Signed)
LOOP RECORDER  

## 2015-02-19 LAB — CUP PACEART REMOTE DEVICE CHECK: MDC IDC SESS DTM: 20161009090534

## 2015-02-19 NOTE — Progress Notes (Signed)
Carelink summary report received. Battery status OK. Normal device function. No new symptom episodes, tachy episodes, brady, or pause episodes. No new AF episodes. Monthly summary reports and ROV with SK in 06/2015. 

## 2015-03-08 ENCOUNTER — Telehealth: Payer: Self-pay | Admitting: Internal Medicine

## 2015-03-08 ENCOUNTER — Ambulatory Visit (INDEPENDENT_AMBULATORY_CARE_PROVIDER_SITE_OTHER): Payer: Managed Care, Other (non HMO) | Admitting: *Deleted

## 2015-03-08 DIAGNOSIS — I639 Cerebral infarction, unspecified: Secondary | ICD-10-CM | POA: Diagnosis not present

## 2015-03-08 NOTE — Telephone Encounter (Signed)
New Message ° °4. Are you calling to see if we received your device transmission? Yes  ° °

## 2015-03-08 NOTE — Telephone Encounter (Signed)
LMOM to verify that her transmission was received and to call back if she has further questions.

## 2015-03-08 NOTE — Progress Notes (Signed)
Carelink Summary Report / Loop recorder 

## 2015-03-25 LAB — CUP PACEART REMOTE DEVICE CHECK: Date Time Interrogation Session: 20161108090515

## 2015-03-29 ENCOUNTER — Other Ambulatory Visit: Payer: Self-pay | Admitting: *Deleted

## 2015-03-29 ENCOUNTER — Other Ambulatory Visit: Payer: Self-pay | Admitting: Cardiovascular Disease

## 2015-03-29 MED ORDER — HYDROCHLOROTHIAZIDE 12.5 MG PO CAPS: 12.5000 mg | ORAL_CAPSULE | Freq: Every day | ORAL | Status: DC

## 2015-04-07 ENCOUNTER — Ambulatory Visit (INDEPENDENT_AMBULATORY_CARE_PROVIDER_SITE_OTHER): Payer: Managed Care, Other (non HMO) | Admitting: *Deleted

## 2015-04-07 DIAGNOSIS — I639 Cerebral infarction, unspecified: Secondary | ICD-10-CM | POA: Diagnosis not present

## 2015-04-07 NOTE — Progress Notes (Signed)
Carelink Summary Report / Loop Recorder 

## 2015-05-05 ENCOUNTER — Encounter: Payer: Self-pay | Admitting: Cardiology

## 2015-05-09 ENCOUNTER — Ambulatory Visit (INDEPENDENT_AMBULATORY_CARE_PROVIDER_SITE_OTHER): Payer: Managed Care, Other (non HMO) | Admitting: *Deleted

## 2015-05-09 ENCOUNTER — Other Ambulatory Visit: Payer: Self-pay | Admitting: Internal Medicine

## 2015-05-09 DIAGNOSIS — I639 Cerebral infarction, unspecified: Secondary | ICD-10-CM | POA: Diagnosis not present

## 2015-05-10 NOTE — Progress Notes (Signed)
Carelink Summary Report / Loop Recorder 

## 2015-05-12 ENCOUNTER — Telehealth: Payer: Self-pay | Admitting: Internal Medicine

## 2015-05-12 NOTE — Telephone Encounter (Signed)
Follow Up  1. Are you calling to see if we received your device transmission? Darreld Mclean

## 2015-05-12 NOTE — Telephone Encounter (Signed)
LM informing patient that remote was received. I encouraged patient to call if she has any other questions.

## 2015-05-31 LAB — CUP PACEART REMOTE DEVICE CHECK: Date Time Interrogation Session: 20161208090530

## 2015-06-01 ENCOUNTER — Other Ambulatory Visit: Payer: Self-pay

## 2015-06-01 DIAGNOSIS — Z1231 Encounter for screening mammogram for malignant neoplasm of breast: Secondary | ICD-10-CM

## 2015-06-06 ENCOUNTER — Ambulatory Visit (INDEPENDENT_AMBULATORY_CARE_PROVIDER_SITE_OTHER): Payer: Managed Care, Other (non HMO) | Admitting: *Deleted

## 2015-06-06 DIAGNOSIS — I639 Cerebral infarction, unspecified: Secondary | ICD-10-CM | POA: Diagnosis not present

## 2015-06-07 ENCOUNTER — Encounter: Payer: Self-pay | Admitting: Internal Medicine

## 2015-06-07 ENCOUNTER — Ambulatory Visit (INDEPENDENT_AMBULATORY_CARE_PROVIDER_SITE_OTHER): Payer: Managed Care, Other (non HMO) | Admitting: Internal Medicine

## 2015-06-07 VITALS — BP 144/86 | HR 70 | Ht 65.5 in | Wt 147.2 lb

## 2015-06-07 DIAGNOSIS — Z4509 Encounter for adjustment and management of other cardiac device: Secondary | ICD-10-CM

## 2015-06-07 DIAGNOSIS — I639 Cerebral infarction, unspecified: Secondary | ICD-10-CM | POA: Diagnosis not present

## 2015-06-07 LAB — CUP PACEART INCLINIC DEVICE CHECK: MDC IDC SESS DTM: 20170207162747

## 2015-06-07 MED ORDER — CHLORTHALIDONE 25 MG PO TABS: 25.0000 mg | ORAL_TABLET | Freq: Every day | ORAL | Status: DC

## 2015-06-07 NOTE — Patient Instructions (Signed)
Medication Instructions: 1) Stop HCTZ 2) Start Chlorthalidone 25 mg one tablet by mouth once daily (start about 2 weeks prior to your follow up with her PCP)  Labwork: - none  Procedures/Testing: - none  Follow-Up: - Your physician wants you to follow-up in: 1 year with Dr. Caryl Comes. You will receive a reminder letter in the mail two months in advance. If you don't receive a letter, please call our office to schedule the follow-up appointment.  Any Additional Special Instructions Will Be Listed Below (If Applicable).     If you need a refill on your cardiac medications before your next appointment, please call your pharmacy.

## 2015-06-07 NOTE — Progress Notes (Signed)
      Patient Care Team: [name], MD as PCP - General (Family Medicine)   HPI  Katrina Liu is a 55 y.o. female Seen in follow-up for cryptogenic stroke status post LINQ implantation At her visit last year Review of the strips suggestive of atrial fibrillation were felt to be NOT consistent with that. Recommended to neurology that they consider statin therapy based on SPARCL   spoke tonight with Dr Leonie Man who makes the point that not all cryptogenic stroke is vascular and so is not inclined to use statin    Past Medical History  Diagnosis Date  . Headache(784.0)   . Anemia   . GERD (gastroesophageal reflux disease)     h/o - not now  . S/P abdominal hysterectomy 04/17/2011  . Stroke ([facility])   . Hypertension     Past Surgical History  Procedure Laterality Date  . Laparotomy      ectopic pregnancy  . Shoulder arthroscopy    . Kidney stone surgery    . Abdominal hysterectomy  04/16/2011    Procedure: HYSTERECTOMY ABDOMINAL;  Surgeon: Marvene Staff, MD;  Location: [facility];  Service: Gynecology;  Laterality: N/A;  . Tee without cardioversion N/A 12/24/2012    Procedure: TRANSESOPHAGEAL ECHOCARDIOGRAM (TEE);  Surgeon: Jettie Booze, MD;  Location: [facility];  Service: Cardiovascular;  Laterality: N/A;  . Loop recorder implant N/A 12/24/2012    Procedure: LOOP RECORDER IMPLANT;  Surgeon: Deboraha Sprang, MD;  Location: [facility] CATH LAB;  Service: Cardiovascular;  Laterality: N/A;    Current Outpatient Prescriptions  Medication Sig Dispense Refill  . aspirin EC 81 MG EC tablet Take 1 tablet (81 mg total) by mouth daily. 90 tablet 0  . Flaxseed, Linseed, (FLAX SEED OIL PO) Take 1 tablet by mouth daily.    . hydrochlorothiazide (MICROZIDE) 12.5 MG capsule Take 1 capsule (12.5 mg total) by mouth daily. 30 capsule 3  . labetalol (NORMODYNE) 100 MG tablet TAKE 1 TABLET (100 MG TOTAL) BY MOUTH 2 (TWO) TIMES DAILY. 180 tablet 0  . mometasone (ELOCON) 0.1 % ointment Apply 1  application topically as needed (dry skin).     . Multiple Vitamins-Minerals (MULTIVITAMIN WITH MINERALS) tablet Take 1 tablet by mouth daily. Hair, Skin and Nails    . Omega-3 Fatty Acids (FISH OIL PO) Take 1 tablet by mouth daily.    [name] UNABLE TO FIND Take 1 tablet by mouth daily. Tumeric: sprinkles over food and 1 tablet daily     No current facility-administered medications for this visit.    Allergies  Allergen Reactions  . Lisinopril Swelling      Review of Systems negative except from HPI and PMH  Physical Exam BP 144/86 mmHg  Pulse 70  Ht 5' 5.5" (1.664 m)  Wt 147 lb 3.2 oz (66.769 kg)  BMI 24.11 kg/m2  LMP 03/17/2011 Well developed and well nourished in no acute distress HENT normal E scleral and icterus clear Neck Supple JVP flat; carotids brisk and full Clear to ausculation  Regular rate and rhythm, no murmurs gallops or rub Soft with active bowel sounds No clubbing cyanosis  Edema Alert and oriented, grossly normal motor and sensory function Skin Warm and Dry    Assessment and  Plan  Cryptogenic stroke  ILR  Hypertension    Will continue current meds   No statins pre neruo Need to keep close eye on BP given recent data Goal is 120-130

## 2015-06-07 NOTE — Progress Notes (Signed)
Carelink Summary Report / Loop Recorder 

## 2015-06-16 ENCOUNTER — Ambulatory Visit
Admission: RE | Admit: 2015-06-16 | Discharge: 2015-06-16 | Disposition: A | Discharge status: Home / Self Care | Payer: Managed Care, Other (non HMO) | Source: Ambulatory Visit

## 2015-06-16 DIAGNOSIS — Z1231 Encounter for screening mammogram for malignant neoplasm of breast: Secondary | ICD-10-CM

## 2015-06-20 LAB — CUP PACEART REMOTE DEVICE CHECK: MDC IDC SESS DTM: 20170107092039

## 2015-06-20 NOTE — Progress Notes (Signed)
Carelink summary report received. Battery status OK. Normal device function. No new symptom episodes, tachy episodes, brady, or pause episodes. No new AF episodes. Monthly summary reports and ROV/PRN 

## 2015-07-06 ENCOUNTER — Ambulatory Visit (INDEPENDENT_AMBULATORY_CARE_PROVIDER_SITE_OTHER): Payer: Managed Care, Other (non HMO) | Admitting: *Deleted

## 2015-07-06 DIAGNOSIS — I639 Cerebral infarction, unspecified: Secondary | ICD-10-CM | POA: Diagnosis not present

## 2015-07-08 NOTE — Progress Notes (Signed)
Carelink Summary Report / Loop Recorder 

## 2015-07-09 LAB — CUP PACEART REMOTE DEVICE CHECK: Date Time Interrogation Session: 20170309015504

## 2015-07-09 NOTE — Progress Notes (Signed)
Carelink summary report received. Battery status OK. Normal device function. No new symptom episodes, tachy episodes, brady, or pause episodes. No new AF episodes. Monthly summary reports and ROV/PRN 

## 2015-08-04 LAB — CUP PACEART REMOTE DEVICE CHECK: MDC IDC SESS DTM: 20170206093549

## 2015-08-04 NOTE — Progress Notes (Signed)
Carelink summary report received. Battery status OK. Normal device function. No new symptom episodes, tachy episodes, brady, or pause episodes. No new AF episodes. Monthly summary reports and ROV/PRN 

## 2015-08-05 ENCOUNTER — Ambulatory Visit (INDEPENDENT_AMBULATORY_CARE_PROVIDER_SITE_OTHER): Payer: Managed Care, Other (non HMO) | Admitting: *Deleted

## 2015-08-05 DIAGNOSIS — I639 Cerebral infarction, unspecified: Secondary | ICD-10-CM | POA: Diagnosis not present

## 2015-08-05 NOTE — Progress Notes (Signed)
Carelink Summary Report / Loop Recorder 

## 2015-08-08 ENCOUNTER — Encounter: Payer: Managed Care, Other (non HMO) | Admitting: Internal Medicine

## 2015-08-11 ENCOUNTER — Other Ambulatory Visit: Payer: Self-pay | Admitting: *Deleted

## 2015-08-11 ENCOUNTER — Telehealth: Payer: Self-pay | Admitting: Internal Medicine

## 2015-08-11 MED ORDER — LABETALOL HCL 100 MG PO TABS: 100.0000 mg | ORAL_TABLET | Freq: Two times a day (BID) | ORAL | Status: DC

## 2015-08-11 NOTE — Telephone Encounter (Signed)
Left pt a message to call back. 

## 2015-08-11 NOTE — Telephone Encounter (Signed)
Spoke with Katrina Liu and informed her that we have not received the recent blood work from Dr. Garwin Brothers office. Advised the last we have is from 2016. Katrina Liu states that she is going to call Dr. Garwin Brothers office to see if results are back and have them fax them if they are as Dr. Caryl Comes wanted to see her K+ level due to her taking Chlorthalidone. Will route to Dr. Olin Pia nurse to make her aware of labs coming.

## 2015-08-11 NOTE — Telephone Encounter (Signed)
Per pt call did this office received the lab work from Dr Garwin Brothers OBGYN's office.   Potasium results.   Pt would a call back please.

## 2015-08-26 ENCOUNTER — Encounter: Payer: Self-pay | Admitting: Internal Medicine

## 2015-08-29 ENCOUNTER — Encounter: Payer: Self-pay | Admitting: Internal Medicine

## 2015-09-05 ENCOUNTER — Ambulatory Visit (INDEPENDENT_AMBULATORY_CARE_PROVIDER_SITE_OTHER): Payer: Managed Care, Other (non HMO) | Admitting: *Deleted

## 2015-09-05 DIAGNOSIS — I639 Cerebral infarction, unspecified: Secondary | ICD-10-CM

## 2015-09-05 NOTE — Progress Notes (Signed)
Carelink Summary Report / Loop Recorder 

## 2015-09-14 ENCOUNTER — Telehealth: Payer: Self-pay | Admitting: Internal Medicine

## 2015-09-14 NOTE — Telephone Encounter (Signed)
I spoke with the patient. She states she has been taking some magnesium supplements to help with her sleep and she feels this is working. She inquired if this is ok to use. I advised her she may take magnesium.

## 2015-09-14 NOTE — Telephone Encounter (Signed)
New Message    Pt is concern with magnesium sup in relation to her other meds Please call back and discuss.

## 2015-09-24 LAB — CUP PACEART REMOTE DEVICE CHECK: Date Time Interrogation Session: 20170407100543

## 2015-09-24 NOTE — Progress Notes (Signed)
Carelink summary report received. Battery status OK. Normal device function. No new symptom episodes, tachy episodes, brady, or pause episodes. No new AF episodes. Monthly summary reports and ROV/PRN 

## 2015-10-04 ENCOUNTER — Ambulatory Visit (INDEPENDENT_AMBULATORY_CARE_PROVIDER_SITE_OTHER): Payer: Managed Care, Other (non HMO) | Admitting: *Deleted

## 2015-10-04 DIAGNOSIS — I639 Cerebral infarction, unspecified: Secondary | ICD-10-CM

## 2015-10-04 NOTE — Progress Notes (Signed)
Carelink Summary Report / Loop Recorder 

## 2015-10-12 LAB — CUP PACEART REMOTE DEVICE CHECK: Date Time Interrogation Session: 20170507103543

## 2015-11-02 LAB — CUP PACEART REMOTE DEVICE CHECK: Date Time Interrogation Session: 20170606110522

## 2015-11-03 ENCOUNTER — Ambulatory Visit (INDEPENDENT_AMBULATORY_CARE_PROVIDER_SITE_OTHER): Payer: Managed Care, Other (non HMO) | Admitting: *Deleted

## 2015-11-03 DIAGNOSIS — I639 Cerebral infarction, unspecified: Secondary | ICD-10-CM

## 2015-11-03 NOTE — Progress Notes (Signed)
Carelink Summary Report / Loop Recorder 

## 2015-11-17 LAB — CUP PACEART REMOTE DEVICE CHECK: MDC IDC SESS DTM: 20170706120555

## 2015-12-05 ENCOUNTER — Ambulatory Visit (INDEPENDENT_AMBULATORY_CARE_PROVIDER_SITE_OTHER): Payer: Managed Care, Other (non HMO) | Admitting: *Deleted

## 2015-12-05 DIAGNOSIS — I639 Cerebral infarction, unspecified: Secondary | ICD-10-CM

## 2015-12-05 NOTE — Progress Notes (Signed)
Carelink Summary Report / Loop Recorder 

## 2015-12-12 LAB — CUP PACEART REMOTE DEVICE CHECK: MDC IDC SESS DTM: 20170806234726

## 2016-01-03 ENCOUNTER — Ambulatory Visit (INDEPENDENT_AMBULATORY_CARE_PROVIDER_SITE_OTHER): Payer: Managed Care, Other (non HMO) | Admitting: *Deleted

## 2016-01-03 DIAGNOSIS — I639 Cerebral infarction, unspecified: Secondary | ICD-10-CM

## 2016-01-04 NOTE — Progress Notes (Signed)
Carelink Summary Report / Loop Recorder 

## 2016-01-29 LAB — CUP PACEART REMOTE DEVICE CHECK: Date Time Interrogation Session: 20170904123641

## 2016-01-29 NOTE — Progress Notes (Signed)
Carelink summary report received. Battery status OK. Normal device function. No new symptom episodes, tachy episodes, brady, or pause episodes. No new AF episodes. Monthly summary reports and ROV/PRN 

## 2016-02-01 ENCOUNTER — Ambulatory Visit (INDEPENDENT_AMBULATORY_CARE_PROVIDER_SITE_OTHER): Payer: Managed Care, Other (non HMO) | Admitting: *Deleted

## 2016-02-01 DIAGNOSIS — I639 Cerebral infarction, unspecified: Secondary | ICD-10-CM | POA: Diagnosis not present

## 2016-02-01 NOTE — Progress Notes (Signed)
Carelink Summary Report / Loop Recorder 

## 2016-03-02 ENCOUNTER — Ambulatory Visit (INDEPENDENT_AMBULATORY_CARE_PROVIDER_SITE_OTHER): Payer: Managed Care, Other (non HMO) | Admitting: *Deleted

## 2016-03-02 DIAGNOSIS — I639 Cerebral infarction, unspecified: Secondary | ICD-10-CM

## 2016-03-02 NOTE — Progress Notes (Signed)
Carelink Summary Report / Loop Recorder 

## 2016-03-11 LAB — CUP PACEART REMOTE DEVICE CHECK
Implantable Pulse Generator Implant Date: 20140827
MDC IDC SESS DTM: 20171004123659

## 2016-03-11 NOTE — Progress Notes (Signed)
Carelink summary report received. Battery status OK. Normal device function. No new symptom episodes, tachy episodes, brady, or pause episodes. No new AF episodes. Monthly summary reports and ROV/PRN 

## 2016-04-02 ENCOUNTER — Ambulatory Visit (INDEPENDENT_AMBULATORY_CARE_PROVIDER_SITE_OTHER): Payer: Managed Care, Other (non HMO) | Admitting: *Deleted

## 2016-04-02 DIAGNOSIS — I639 Cerebral infarction, unspecified: Secondary | ICD-10-CM | POA: Diagnosis not present

## 2016-04-02 NOTE — Progress Notes (Signed)
Carelink Summary Report / Loop Recorder 

## 2016-04-15 LAB — CUP PACEART REMOTE DEVICE CHECK
Implantable Pulse Generator Implant Date: 20140827
MDC IDC SESS DTM: 20171103143754

## 2016-05-01 ENCOUNTER — Ambulatory Visit (INDEPENDENT_AMBULATORY_CARE_PROVIDER_SITE_OTHER): Payer: Managed Care, Other (non HMO) | Admitting: *Deleted

## 2016-05-01 DIAGNOSIS — I639 Cerebral infarction, unspecified: Secondary | ICD-10-CM

## 2016-05-01 NOTE — Progress Notes (Signed)
carelink summary report 

## 2016-05-19 LAB — CUP PACEART REMOTE DEVICE CHECK
Implantable Pulse Generator Implant Date: 20140827
MDC IDC SESS DTM: 20171203144357

## 2016-05-19 NOTE — Progress Notes (Signed)
Carelink summary report received. Battery status OK. Normal device function. No new symptom episodes, tachy episodes, brady, or pause episodes. No new AF episodes. Monthly summary reports and ROV/PRN 

## 2016-05-22 ENCOUNTER — Other Ambulatory Visit: Payer: Self-pay | Admitting: Obstetrics and Gynecology

## 2016-05-22 DIAGNOSIS — Z1231 Encounter for screening mammogram for malignant neoplasm of breast: Secondary | ICD-10-CM

## 2016-05-29 ENCOUNTER — Encounter: Payer: Self-pay | Admitting: Internal Medicine

## 2016-05-31 ENCOUNTER — Ambulatory Visit (INDEPENDENT_AMBULATORY_CARE_PROVIDER_SITE_OTHER): Payer: Managed Care, Other (non HMO) | Admitting: *Deleted

## 2016-05-31 DIAGNOSIS — I639 Cerebral infarction, unspecified: Secondary | ICD-10-CM | POA: Diagnosis not present

## 2016-05-31 NOTE — Progress Notes (Signed)
Carelink Summary Report / Loop Recorder 

## 2016-06-11 LAB — CUP PACEART REMOTE DEVICE CHECK
Date Time Interrogation Session: 20180102161905
MDC IDC PG IMPLANT DT: 20140827

## 2016-06-18 ENCOUNTER — Ambulatory Visit
Admission: RE | Admit: 2016-06-18 | Discharge: 2016-06-18 | Disposition: A | Discharge status: Home / Self Care | Payer: Managed Care, Other (non HMO) | Source: Ambulatory Visit | Attending: Obstetrics and Gynecology | Admitting: Obstetrics and Gynecology

## 2016-06-18 DIAGNOSIS — Z1231 Encounter for screening mammogram for malignant neoplasm of breast: Secondary | ICD-10-CM

## 2016-06-22 ENCOUNTER — Telehealth: Payer: Self-pay | Admitting: *Deleted

## 2016-06-22 LAB — CUP PACEART REMOTE DEVICE CHECK
Date Time Interrogation Session: 20180201153611
MDC IDC PG IMPLANT DT: 20140827

## 2016-06-22 NOTE — Telephone Encounter (Signed)
Patient returned call.  She is aware of appointment with Dr. Caryl Comes on 07/27/16.  Patient verbalizes understanding of information about LINQ at [facility] and is aware that she should receive a Carelink monitor return kit at her home address in 7-10 business days.  Patient is appreciative of call and denies additional questions or concerns at this time.  Carelink return kit ordered, unenrolled.

## 2016-06-22 NOTE — Telephone Encounter (Signed)
LMOVM requesting call back.  Gave [facility] phone number.  LINQ at [facility] as of 05/27/16.  Patient is scheduled for appointment with Dr. Klein on 07/27/16, appointment notes updated.  Will advise that return kit will be ordered to patient's preferred address. 

## 2016-06-30 ENCOUNTER — Other Ambulatory Visit: Payer: Self-pay | Admitting: Internal Medicine

## 2016-06-30 DIAGNOSIS — I639 Cerebral infarction, unspecified: Secondary | ICD-10-CM

## 2016-06-30 DIAGNOSIS — Z4509 Encounter for adjustment and management of other cardiac device: Secondary | ICD-10-CM

## 2016-07-02 ENCOUNTER — Telehealth: Payer: Self-pay | Admitting: Internal Medicine

## 2016-07-02 NOTE — Telephone Encounter (Signed)
Pt c/o medication issue:  1. Name of Medication: chlorthalidone 25mg   2. How are you currently taking this medication (dosage and times per day)? 2xday  3. Are you having a reaction (difficulty breathing--STAT)? no  4. What is your medication issue? Dry mouth and sores started in January and now coming back    Call the number listed before 5pm today

## 2016-07-03 ENCOUNTER — Telehealth: Payer: Self-pay | Admitting: Internal Medicine

## 2016-07-03 NOTE — Telephone Encounter (Signed)
New message    Pt c/o medication issue:  1. Name of Medication: chlorthalidone 25 mg  2. How are you currently taking this medication (dosage and times per day)? 1 daily  3. Are you having a reaction (difficulty breathing--STAT)? Pt states she is having dry mouth and mouth sores.  4. What is your medication issue? Pt states she is having a reaction to the medication.

## 2016-07-03 NOTE — Telephone Encounter (Signed)
Spoke with patient who states she has been having dry mouth for the past 2-3 months and in conjunction with this she gets painful mouth sores. She asks if this could be caused by chlorthalidone. I verified that she has been taking chlorthalidone for the last year which she confirmed. I asked if she has started on any new medications and she denies; states she is pretty certain she has not started anything within the time that this condition began. I advised that dry mouth is not listed as a possible side effect of chlorthalidone but advised that side effects can be specific to an individual. She states she changed toothpaste within the last few months and that this could be the potential cause. She states she got a mouth wash a few days ago that she thinks is helping. I advised her to follow-up with her dentist and/or PCP for advice regarding this problem. She verbalized understanding and agreement and is aware of her follow-up appointment with Dr. Caryl Comes on 3/30.

## 2016-07-05 NOTE — Telephone Encounter (Signed)
Not sure related but stop med for 4 weeks  If fluid call and will use furosemide 20 qod

## 2016-07-27 ENCOUNTER — Ambulatory Visit (INDEPENDENT_AMBULATORY_CARE_PROVIDER_SITE_OTHER): Payer: Managed Care, Other (non HMO) | Admitting: Internal Medicine

## 2016-07-27 ENCOUNTER — Encounter: Payer: Self-pay | Admitting: Internal Medicine

## 2016-07-27 VITALS — BP 108/74 | HR 65 | Ht 65.0 in | Wt 144.0 lb

## 2016-07-27 DIAGNOSIS — Z4509 Encounter for adjustment and management of other cardiac device: Secondary | ICD-10-CM

## 2016-07-27 DIAGNOSIS — I639 Cerebral infarction, unspecified: Secondary | ICD-10-CM | POA: Diagnosis not present

## 2016-07-27 NOTE — Patient Instructions (Signed)
Medication Instructions: - Your physician recommends that you continue on your current medications as directed. Please refer to the Current Medication list given to you today.  Labwork: - none ordered  Procedures/Testing: - Your physician has recommended that you have your loop recorder [facility]) explanted  - Wednesday 08/15/16 with Dr. Caryl Comes - Arrive at the [facility], Entrance "A," at [facility] at 7:30 am - once you enter, proceed straight down the hall way to Admitting to register, this will be on your left - you may have a light breakfast the morning of your procedure - you may all of your regular medications with enough water to get them down safely   Follow-Up: - Your physician recommends that you schedule a follow-up appointment in: 10-14 days (from 08/15/16) for a wound check with the [facility].    Any Additional Special Instructions Will Be Listed Below (If Applicable).     If you need a refill on your cardiac medications before your next appointment, please call your pharmacy.

## 2016-07-27 NOTE — Progress Notes (Signed)
Patient Care Team: [name], MD as PCP - General (Family Medicine)   HPI  Katrina Liu is a 56 y.o. female Seen in follow-up for cryptogenic stroke status post LINQ implantation At her visit last year Review of the strips suggestive of atrial fibrillation were felt to be NOT consistent with that. Recommended to neurology that they consider statin therapy based on SPARCL   spoke tonight with Dr Leonie Man who makes the point that not all cryptogenic stroke is vascular and so is not inclined to use statin  The patient denies chest pain, shortness of breath, nocturnal dyspnea, orthopnea or peripheral edema.  There have been no palpitations, lightheadedness or syncope.   She would like her loop out    Past Medical History:  Diagnosis Date  . Anemia   . GERD (gastroesophageal reflux disease)    h/o - not now  . Headache(784.0)   . Hypertension   . S/P abdominal hysterectomy 04/17/2011  . Stroke [facility])     Past Surgical History:  Procedure Laterality Date  . ABDOMINAL HYSTERECTOMY  04/16/2011   Procedure: HYSTERECTOMY ABDOMINAL;  Surgeon: Marvene Staff, MD;  Location: [facility];  Service: Gynecology;  Laterality: N/A;  . BREAST BIOPSY Left 2013  . KIDNEY STONE SURGERY    . LAPAROTOMY     ectopic pregnancy  . LOOP RECORDER IMPLANT N/A 12/24/2012   Procedure: LOOP RECORDER IMPLANT;  Surgeon: Deboraha Sprang, MD;  Location[facility] CATH LAB;  Service: Cardiovascular;  Laterality: N/A;  . SHOULDER ARTHROSCOPY    . TEE WITHOUT CARDIOVERSION N/A 12/24/2012   Procedure: TRANSESOPHAGEAL ECHOCARDIOGRAM (TEE);  Surgeon: Jettie Booze, MD;  Location: [facility] ENDOSCOPY;  Service: Cardiovascular;  Laterality: N/A;    Current Outpatient Prescriptions  Medication Sig Dispense Refill  . Ascorbic Acid (VITAMIN C) 500 MG CAPS Take 1 capsule by mouth daily.    [name] aspirin EC 81 MG EC tablet Take 1 tablet (81 mg total) by mouth daily. 90 tablet 0  . chlorthalidone (HYGROTON) 25 MG tablet  TAKE 1 TABLET (25 MG TOTAL) BY MOUTH DAILY. 90 tablet 0  . Flaxseed, Linseed, (FLAX SEED OIL PO) Take 1 tablet by mouth daily.    [name] labetalol (NORMODYNE) 100 MG tablet Take 1 tablet (100 mg total) by mouth 2 (two) times daily. 180 tablet 3  . mometasone (ELOCON) 0.1 % ointment Apply 1 application topically as needed (dry skin).     . Multiple Vitamins-Minerals (MULTIVITAMIN WITH MINERALS) tablet Take 1 tablet by mouth daily. Hair, Skin and Nails    . Omega-3 Fatty Acids (FISH OIL PO) Take 1 tablet by mouth daily.    [name] UNABLE TO FIND Take 1 tablet by mouth daily. Tumeric: sprinkles over food and 1 tablet daily    . Zinc Sulfate (ZINC 15 PO) Take 1 tablet by mouth daily.     No current facility-administered medications for this visit.     Allergies  Allergen Reactions  . Lisinopril Swelling      Review of Systems negative except from HPI and PMH  Physical Exam BP 108/74   Pulse 65   Ht 5\' 5"  (1.651 m)   Wt 144 lb (65.3 kg)   LMP 04/16/2011   SpO2 98%   BMI 23.96 kg/m  Well developed and well nourished in no acute distress HENT normal E scleral and icterus clear Neck Supple JVP flat; carotids brisk and full Clear to ausculation Loop[ in place  Regular rate and rhythm,  no murmurs gallops or rub Soft with active bowel sounds No clubbing cyanosis  Edema Alert and oriented, grossly normal motor and sensory function Skin Warm and Dry  ECG was reviewed. Demonstrated sinus rhythm at 65 intervals 16/09/40  Assessment and  Plan  Cryptogenic stroke  ILR  Hypertension  Blood pressure exceptionally well controlled. We will refill her medications.  She would like her leg recorder removed. We have discussed the procedure.  Not on statin therapy per neuro

## 2016-07-30 NOTE — Addendum Note (Signed)
Addended by: Campbell Riches on: 07/30/2016 11:43 AM   Modules accepted: Orders

## 2016-08-15 ENCOUNTER — Ambulatory Visit (HOSPITAL_COMMUNITY)
Admission: RE | Admit: 2016-08-15 | Discharge: 2016-08-15 | Disposition: A | Discharge status: Home / Self Care | Payer: Managed Care, Other (non HMO) | Source: Ambulatory Visit | Attending: Internal Medicine | Admitting: Internal Medicine

## 2016-08-15 ENCOUNTER — Encounter (HOSPITAL_COMMUNITY): Payer: Self-pay | Admitting: Internal Medicine

## 2016-08-15 ENCOUNTER — Encounter (HOSPITAL_COMMUNITY)
Admission: RE | Disposition: A | Discharge status: Home / Self Care | Payer: Self-pay | Source: Ambulatory Visit | Attending: Internal Medicine

## 2016-08-15 DIAGNOSIS — Z7982 Long term (current) use of aspirin: Secondary | ICD-10-CM | POA: Insufficient documentation

## 2016-08-15 DIAGNOSIS — D649 Anemia, unspecified: Secondary | ICD-10-CM | POA: Insufficient documentation

## 2016-08-15 DIAGNOSIS — K219 Gastro-esophageal reflux disease without esophagitis: Secondary | ICD-10-CM | POA: Diagnosis not present

## 2016-08-15 DIAGNOSIS — Z4509 Encounter for adjustment and management of other cardiac device: Secondary | ICD-10-CM | POA: Diagnosis present

## 2016-08-15 DIAGNOSIS — I639 Cerebral infarction, unspecified: Secondary | ICD-10-CM | POA: Diagnosis not present

## 2016-08-15 DIAGNOSIS — Z8673 Personal history of transient ischemic attack (TIA), and cerebral infarction without residual deficits: Secondary | ICD-10-CM | POA: Insufficient documentation

## 2016-08-15 DIAGNOSIS — I10 Essential (primary) hypertension: Secondary | ICD-10-CM | POA: Diagnosis not present

## 2016-08-15 HISTORY — PX: LOOP RECORDER REMOVAL: EP1215

## 2016-08-15 SURGERY — LOOP RECORDER REMOVAL
Anesthesia: LOCAL

## 2016-08-15 MED ORDER — LIDOCAINE HCL (PF) 1 % IJ SOLN
INTRAMUSCULAR | Status: AC
  Filled 2016-08-15: qty 30

## 2016-08-15 MED ORDER — LIDOCAINE HCL (PF) 1 % IJ SOLN
INTRAMUSCULAR | Status: DC | PRN
Start: 2016-08-15 — End: 2016-08-15
  Administered 2016-08-15: 30 mL

## 2016-08-15 SURGICAL SUPPLY — 1 items: PACK LOOP INSERTION (CUSTOM PROCEDURE TRAY) ×2 IMPLANT

## 2016-08-15 NOTE — Progress Notes (Addendum)
Loop removal completed. Dressing over site with small amount of bloody drainage that has not changed in amount since marked by cath lab staff. Pt able to verbalize instructions.  Discharged ambulatory with daughter without complaint.  I realized after pt left the hospital that she left her written instruction papers in the room. I called pt and she states that she feels comfortable with instructions but will call today if further questions and will stop by tomorrow on her way to work to pick up papers.

## 2016-08-15 NOTE — Interval H&P Note (Signed)
History and Physical Interval Note:  08/15/2016 7:44 AM  Katrina Liu  has presented today for surgery, with the diagnosis of ERI  The various methods of treatment have been discussed with the patient and family. After consideration of risks, benefits and other options for treatment, the patient has consented to  Procedure(s): Loop Recorder Removal (N/A) as a surgical intervention .  The patient's history has been reviewed, patient examined, no change in status, stable for surgery.  I have reviewed the patient's chart and labs.  Questions were answered to the patient's satisfaction.    Stable   Katrina Liu

## 2016-08-15 NOTE — H&P (View-Only) (Signed)
Patient Care Team: [name], MD as PCP - General (Family Medicine)   HPI  Katrina Liu is a 56 y.o. female Seen in follow-up for cryptogenic stroke status post LINQ implantation At her visit last year Review of the strips suggestive of atrial fibrillation were felt to be NOT consistent with that. Recommended to neurology that they consider statin therapy based on SPARCL   spoke tonight with Dr Leonie Man who makes the point that not all cryptogenic stroke is vascular and so is not inclined to use statin  The patient denies chest pain, shortness of breath, nocturnal dyspnea, orthopnea or peripheral edema.  There have been no palpitations, lightheadedness or syncope.   She would like her loop out    Past Medical History:  Diagnosis Date  . Anemia   . GERD (gastroesophageal reflux disease)    h/o - not now  . Headache(784.0)   . Hypertension   . S/P abdominal hysterectomy 04/17/2011  . Stroke [facility])     Past Surgical History:  Procedure Laterality Date  . ABDOMINAL HYSTERECTOMY  04/16/2011   Procedure: HYSTERECTOMY ABDOMINAL;  Surgeon: Marvene Staff, MD;  Location: [facility];  Service: Gynecology;  Laterality: N/A;  . BREAST BIOPSY Left 2013  . KIDNEY STONE SURGERY    . LAPAROTOMY     ectopic pregnancy  . LOOP RECORDER IMPLANT N/A 12/24/2012   Procedure: LOOP RECORDER IMPLANT;  Surgeon: Deboraha Sprang, MD;  Location: [facility] CATH LAB;  Service: Cardiovascular;  Laterality: N/A;  . SHOULDER ARTHROSCOPY    . TEE WITHOUT CARDIOVERSION N/A 12/24/2012   Procedure: TRANSESOPHAGEAL ECHOCARDIOGRAM (TEE);  Surgeon: Jettie Booze, MD;  Location: [facility] ENDOSCOPY;  Service: Cardiovascular;  Laterality: N/A;    Current Outpatient Prescriptions  Medication Sig Dispense Refill  . Ascorbic Acid (VITAMIN C) 500 MG CAPS Take 1 capsule by mouth daily.    [name] aspirin EC 81 MG EC tablet Take 1 tablet (81 mg total) by mouth daily. 90 tablet 0  . chlorthalidone (HYGROTON) 25 MG tablet  TAKE 1 TABLET (25 MG TOTAL) BY MOUTH DAILY. 90 tablet 0  . Flaxseed, Linseed, (FLAX SEED OIL PO) Take 1 tablet by mouth daily.    [name] labetalol (NORMODYNE) 100 MG tablet Take 1 tablet (100 mg total) by mouth 2 (two) times daily. 180 tablet 3  . mometasone (ELOCON) 0.1 % ointment Apply 1 application topically as needed (dry skin).     . Multiple Vitamins-Minerals (MULTIVITAMIN WITH MINERALS) tablet Take 1 tablet by mouth daily. Hair, Skin and Nails    . Omega-3 Fatty Acids (FISH OIL PO) Take 1 tablet by mouth daily.    [name] UNABLE TO FIND Take 1 tablet by mouth daily. Tumeric: sprinkles over food and 1 tablet daily    . Zinc Sulfate (ZINC 15 PO) Take 1 tablet by mouth daily.     No current facility-administered medications for this visit.     Allergies  Allergen Reactions  . Lisinopril Swelling      Review of Systems negative except from HPI and PMH  Physical Exam BP 108/74   Pulse 65   Ht 5\' 5"  (1.651 m)   Wt 144 lb (65.3 kg)   LMP 04/16/2011   SpO2 98%   BMI 23.96 kg/m  Well developed and well nourished in no acute distress HENT normal E scleral and icterus clear Neck Supple JVP flat; carotids brisk and full Clear to ausculation Loop[ in place  Regular rate and rhythm,  no murmurs gallops or rub Soft with active bowel sounds No clubbing cyanosis  Edema Alert and oriented, grossly normal motor and sensory function Skin Warm and Dry  ECG was reviewed. Demonstrated sinus rhythm at 65 intervals 16/09/40  Assessment and  Plan  Cryptogenic stroke  ILR  Hypertension  Blood pressure exceptionally well controlled. We will refill her medications.  She would like her leg recorder removed. We have discussed the procedure.  Not on statin therapy per neuro

## 2016-08-15 NOTE — Discharge Instructions (Signed)
°  Follow up appointment with Dr Caryl Comes Friday April 30th at 3:30pm    Sterile Tape Wound Care Some cuts and wounds can be closed using sterile tape, also called skin adhesive strips. Skin adhesive strips can be used for shallow (superficial) and simple cuts, wounds, lacerations, and some surgical incisions. These strips act in place of stitches, or in addition to stitches, to hold the edges of the wound together to allow for better healing. Unlike stitches, the adhesive strips do not require needles or anesthetic medicine for placement. The strips usually fall off on their own as the wound is healing. It is important to take proper care of your wound at home while it heals. How to care for a sterile tape wound  Try to keep the area around your wound clean and dry. Do not allow the adhesive strips to get wet for the first 12 hours.  Do not use any soaps or ointments on the wound for the first 12 hours.  If a bandage (dressing) has been applied, keep it dry.  Follow instructions from your health care provider about how often to change the dressing.  Wash your hands with soap and water before you change your dressing. If soap and water are not available, use hand sanitizer.  Change your dressing as told by your health care provider.  Leave adhesive strips in place. These skin closures may need to stay in place for 2 weeks or longer. If adhesive strip edges start to loosen and curl up, you may trim the loose edges. Do not remove adhesive strips completely unless your health care provider tells you to do that.  Do not scratch, rub, or pick at the wound area.  Protect the wound from further injury until it is healed.  Protect the wound from sun and tanning bed exposure while it is healing, and for several weeks after healing.  Check the wound every day for signs of infection. Check for:  More redness, swelling, or pain.  More fluid or blood.  Warmth.  Pus or a bad smell. Follow these  instructions at home:  Take over-the-counter and prescription medicines only as told by your health care provider.  Keep all follow-up visits as told by your health care provider. This is important. Contact a health care provider if:  Your adhesive strips become soaked with blood or fall off before the wound has healed. The tape will need to be replaced.  You have a fever. Get help right away if:  You have chills.  You develop a rash after the strips are applied.  You have a red streak that goes away from the wound.  You have more redness, swelling, or pain around your wound.  You have more fluid or blood coming from your wound.  Your wound feels warm to the touch.  You have pus or a bad smell coming from your wound.  Your wound breaks open. This information is not intended to replace advice given to you by your health care provider. Make sure you discuss any questions you have with your health care provider. Document Released: 05/24/2004 Document Revised: 03/09/2016 Document Reviewed: 03/09/2016 Elsevier Interactive Patient Education  2017 [employer].

## 2016-08-27 ENCOUNTER — Ambulatory Visit (INDEPENDENT_AMBULATORY_CARE_PROVIDER_SITE_OTHER): Payer: Self-pay | Admitting: *Deleted

## 2016-08-27 DIAGNOSIS — I639 Cerebral infarction, unspecified: Secondary | ICD-10-CM

## 2016-08-27 LAB — CUP PACEART INCLINIC DEVICE CHECK
MDC IDC PG IMPLANT DT: 20140827
MDC IDC SESS DTM: 20180430161449

## 2016-08-27 NOTE — Progress Notes (Signed)
Wound check appointment. Wound without redness or edema. Incision edges approximated, wound well healed. Home monitor returned via return kit.

## 2016-09-05 ENCOUNTER — Other Ambulatory Visit: Payer: Self-pay | Admitting: *Deleted

## 2016-09-05 MED ORDER — LABETALOL HCL 100 MG PO TABS: 100.0000 mg | ORAL_TABLET | Freq: Two times a day (BID) | ORAL | 3 refills | Status: DC

## 2016-10-01 ENCOUNTER — Other Ambulatory Visit: Payer: Self-pay | Admitting: Internal Medicine

## 2016-10-01 DIAGNOSIS — Z4509 Encounter for adjustment and management of other cardiac device: Secondary | ICD-10-CM

## 2016-10-01 DIAGNOSIS — I639 Cerebral infarction, unspecified: Secondary | ICD-10-CM

## 2017-06-05 ENCOUNTER — Other Ambulatory Visit: Payer: Self-pay | Admitting: Obstetrics and Gynecology

## 2017-06-05 DIAGNOSIS — Z1231 Encounter for screening mammogram for malignant neoplasm of breast: Secondary | ICD-10-CM

## 2017-06-25 DIAGNOSIS — K1379 Other lesions of oral mucosa: Secondary | ICD-10-CM | POA: Insufficient documentation

## 2017-06-28 ENCOUNTER — Ambulatory Visit: Payer: Managed Care, Other (non HMO)

## 2017-06-30 ENCOUNTER — Emergency Department (HOSPITAL_COMMUNITY)
Admission: EM | Admit: 2017-06-30 | Discharge: 2017-06-30 | Disposition: A | Discharge status: Home / Self Care | Payer: Managed Care, Other (non HMO) | Attending: Emergency Medicine | Admitting: Emergency Medicine

## 2017-06-30 ENCOUNTER — Other Ambulatory Visit: Payer: Self-pay

## 2017-06-30 ENCOUNTER — Encounter (HOSPITAL_COMMUNITY): Payer: Self-pay | Admitting: Emergency Medicine

## 2017-06-30 DIAGNOSIS — I10 Essential (primary) hypertension: Secondary | ICD-10-CM | POA: Diagnosis not present

## 2017-06-30 DIAGNOSIS — Z79899 Other long term (current) drug therapy: Secondary | ICD-10-CM | POA: Diagnosis not present

## 2017-06-30 DIAGNOSIS — Z7982 Long term (current) use of aspirin: Secondary | ICD-10-CM | POA: Diagnosis not present

## 2017-06-30 DIAGNOSIS — K120 Recurrent oral aphthae: Secondary | ICD-10-CM | POA: Diagnosis not present

## 2017-06-30 DIAGNOSIS — K1379 Other lesions of oral mucosa: Secondary | ICD-10-CM | POA: Diagnosis present

## 2017-06-30 MED ORDER — HYDROCODONE-ACETAMINOPHEN 10-300 MG/15ML PO SOLN: 5.0000 mL | Freq: Four times a day (QID) | ORAL | 0 refills | Status: DC | PRN

## 2017-06-30 MED ORDER — ACYCLOVIR 400 MG PO TABS: 400.0000 mg | ORAL_TABLET | Freq: Every day | ORAL | 0 refills | Status: AC

## 2017-06-30 MED ORDER — OXYCODONE-ACETAMINOPHEN 5-325 MG PO TABS: 1.0000 | ORAL_TABLET | Freq: Once | ORAL | Status: DC

## 2017-06-30 NOTE — ED Triage Notes (Signed)
Pt to ER for evaluation of multiple oral canker sores throughout oral mucosa, on oral palate, posterior oral mucosa. Swelling/lumps present to palate. Patient states has been to PCP and ENT multiple times, states began 2 weeks ago. Pt reports poor oral intake due to severe pain. Patient states speaking is difficult.

## 2017-06-30 NOTE — ED Provider Notes (Addendum)
[facility] EMERGENCY DEPARTMENT Provider Note   CSN: [id number] Arrival date & time: 06/30/17  4166     History   Chief Complaint Chief Complaint  Patient presents with  . Mouth Lesions    HPI Katrina Liu is a 57 y.o. female.  HPI Patient is a 57 year old female with history of anemia, GERD, hypertension, CVA who presents the ED today complaining of recurrent aphthous ulcers that have been present intermittently since October 2018.  However recently over the last 2 weeks she has had worsening of her symptoms.  Symptoms have also worsened over the last 2 days she has now complaining of 10 out of 10 constant pain that is worse with swallowing and p.o. intake.[name]  She was seen by her PCP and ENT last week and was given viscous lidocaine as well as acyclovir for a right upper lip cold sore.  Patient states I think acyclovir improved cold sore.  States viscous lidocaine improve symptoms for 15 minutes, then symptoms return.  She has also tried taking Tylenol and ibuprofen and her symptoms persist.  States she has had decreased p.o. intake because of the pain.  States she has been able to tolerate fluids, however it is very painful.  Denies any difficulty breathing.  Denies any fevers or other symptoms.  Denies any rashes to the remainder of her body.  She has no other mucosal involvement of the rash.  Denies any new medications, soaps, lotions, or food.  Patient was also referred to dermatology by her ENT doctor last week, but states her appointment is not until May.  She does have an additional ENT appointment tomorrow morning at 8:40 AM.  Past Medical History:  Diagnosis Date  . Anemia   . GERD (gastroesophageal reflux disease)    h/o - not now  . Headache(784.0)   . Hypertension   . S/P abdominal hysterectomy 04/17/2011  . Stroke [facility])     Patient Active Problem List   Diagnosis Date Noted  . Insomnia 06/03/2014  . Migraine, unspecified, without mention of  intractable migraine without mention of status migrainosus 12/25/2012  . 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 Hypertension 12/22/2012  . S/P abdominal hysterectomy 04/17/2011    Past Surgical History:  Procedure Laterality Date  . ABDOMINAL HYSTERECTOMY  04/16/2011   Procedure: HYSTERECTOMY ABDOMINAL;  Surgeon: Marvene Staff, MD;  Location: [facility];  Service: Gynecology;  Laterality: N/A;  . BREAST BIOPSY Left 2013  . KIDNEY STONE SURGERY    . LAPAROTOMY     ectopic pregnancy  . LOOP RECORDER IMPLANT N/A 12/24/2012   Procedure: LOOP RECORDER IMPLANT;  Surgeon: Deboraha Sprang, MD;  Location: [facility] CATH LAB;  Service: Cardiovascular;  Laterality: N/A;  . LOOP RECORDER REMOVAL N/A 08/15/2016   Procedure: Loop Recorder Removal;  Surgeon: Deboraha Sprang, MD;  Location: [facility] CV LAB;  Service: Cardiovascular;  Laterality: N/A;  . SHOULDER ARTHROSCOPY    . TEE WITHOUT CARDIOVERSION N/A 12/24/2012   Procedure: TRANSESOPHAGEAL ECHOCARDIOGRAM (TEE);  Surgeon: Jettie Booze, MD;  Location: [facility];  Service: Cardiovascular;  Laterality: N/A;    OB History    No data available       Home Medications    Prior to Admission medications   Medication Sig Start Date End Date Taking? Authorizing Provider  acyclovir (ZOVIRAX) 400 MG tablet Take 1 tablet (400 mg total) by mouth 5 (five) times daily for 5 days. 06/30/17 07/05/17  Willy Pinkerton S, PA-C  Ascorbic Acid (VITAMIN C) 500 MG CAPS Take 500 mg by mouth daily.     [provider]  aspirin EC 81 MG EC tablet Take 1 tablet (81 mg total) by mouth daily. 12/25/12   Allie Bossier, MD  Biotin 10000 MCG TABS Take 10,000 mcg by mouth daily.    [provider]  chlorthalidone (HYGROTON) 25 MG tablet Take 1 tablet (25 mg total) by mouth daily. 10/02/16   Deboraha Sprang, MD  Flaxseed, Linseed, (FLAX SEED OIL) 1000 MG CAPS Take 1,000 mg by mouth once a week.    [provider]  HYDROcodone-Acetaminophen  (LORTAB) 10-300 MG/15ML SOLN Take 5 mLs by mouth every 6 (six) hours as needed. Do not drive, work, or operate machinery while taking this medication 06/30/17   Glenford Garis S, PA-C  labetalol (NORMODYNE) 100 MG tablet Take 1 tablet (100 mg total) by mouth 2 (two) times daily. 09/05/16   Deboraha Sprang, MD  mometasone (ELOCON) 0.1 % ointment Apply 1 application topically as needed (dry skin).     [provider]  Turmeric 500 MG TABS Take 500 mg by mouth daily.    [provider]  zinc gluconate 50 MG tablet Take 50 mg by mouth daily.    [provider]    Family History Family History  Problem Relation Age of Onset  . Stroke Father 39    Social History Social History   Tobacco Use  . Smoking status: Never Smoker  . Smokeless tobacco: Never Used  Substance Use Topics  . Alcohol use: No    Alcohol/week: 0.0 oz  . Drug use: No     Allergies   Lisinopril   Review of Systems Review of Systems  Constitutional: Negative for fever.  HENT: Positive for sore throat and trouble swallowing. Negative for voice change.        Multiple aphthous ulcers inside the mouth.  Eyes: Negative for visual disturbance.  Respiratory: Negative for shortness of breath.   Gastrointestinal: Negative for vomiting.  Genitourinary: Negative for vaginal pain.       No ulcerations to the vaginal mucosa.  Musculoskeletal: Negative for arthralgias.  Skin: Negative for rash.  Neurological: Negative for headaches.     Physical Exam Updated Vital Signs BP 131/85 (BP Location: Right Arm)   Pulse 88   Temp 99.5 F (37.5 C) (Oral)   Resp 17   LMP 04/16/2011   SpO2 97%   Physical Exam  Constitutional: She appears well-developed and well-nourished. No distress.  HENT:  Head: Normocephalic and atraumatic.  Right Ear: External ear normal.  Left Ear: External ear normal.  Multiple aphthous ulcers diffusely throughout the mouth, on the tongue, and along the soft palate, and  along the tonsillar pillars that are ttp. Multiple dental fillings. No evidence of dental abscess. No peritonsillar swelling or evidence of PTA. Tolerating secretions. Normal voice. Healing herpetic lesion to right upper lip.  Eyes: Conjunctivae and EOM are normal. Pupils are equal, round, and reactive to light.  Neck: Normal range of motion. Neck supple.  No nuchal rigidity.  Cardiovascular: Normal rate and regular rhythm.  No murmur heard. Pulmonary/Chest: Effort normal and breath sounds normal. No respiratory distress.  Abdominal: Soft. Bowel sounds are normal. There is no tenderness.  Musculoskeletal: She exhibits no edema.  Lymphadenopathy:    She has no cervical adenopathy.  Neurological: She is alert.  Mental Status:  Alert, thought content appropriate, able to give a coherent history. Speech fluent  without evidence of aphasia. Able to follow 2 step commands without difficulty.  Cranial Nerves:  II: pupils equal, round, reactive to light III,IV, VI: ptosis not present, extra-ocular motions intact bilaterally  V,VII: smile symmetric, facial light touch sensation equal VIII: hearing grossly normal to voice  X: uvula elevates symmetrically  XII: midline tongue extension without fassiculations  Skin: Skin is warm and dry.  Psychiatric: She has a normal mood and affect.  Nursing note and vitals reviewed.    ED Treatments / Results  Labs (all labs ordered are listed, but only abnormal results are displayed) Labs Reviewed - No data to display  EKG  EKG Interpretation None       Radiology No results found.  Procedures Procedures (including critical care time)  Medications Ordered in ED Medications - No data to display   Initial Impression / Assessment and Plan / ED Course  I have reviewed the triage vital signs and the nursing notes.  Pertinent labs & imaging results that were available during my care of the patient were reviewed by me and considered in my medical  decision making (see chart for details).    Discussed pt presentation and exam findings with Dr. Jeneen Rinks, who personally evaluated the pt and agrees with plan to d/c home with acyclovir, pain medication, and [employer]'s magic mouthwash  Final Clinical Impressions(s) / ED Diagnoses   Final diagnoses:  Aphthous stomatitis    58 year old female with recurrent aphthous ulcers.  Afebrile with normal vital signs here.  Has been seen by her PCP and ENT and has follow-up with additional ENT doctor tomorrow.  Has been referred to dermatology as well with appt in 08/2017.  Exam consistent with diffuse ulcers to the oral mucosa.  Given patient had recent herpetic lesion to upper lip, symptoms could be consistent with herpes gingiva stomatitis and will treat as such.  Pt has a patent airway without stridor and is handling secretions without difficulty; no angioedema. Pt denies lesions to other mucosal surfaces. Doubt SJS, TEN, TSS, tick borne illness, or other serious life threatening condition.  Will send patient home with oral acyclovir, oral pain medication, and [facility]'s magic mouthwash. Advised her to take miralax to avoid constipation with pain medication and to keep ENT appt tomorrow.  ED Discharge Orders        Ordered    HYDROcodone-Acetaminophen (LORTAB) 10-300 MG/15ML SOLN  Every 6 hours PRN     06/30/17 1403    acyclovir (ZOVIRAX) 400 MG tablet  5 times daily     06/30/17 1403       Rodney Booze, PA-C 06/30/17 2246    Tanna Furry, MD 07/01/17 [address] [place] Crance S, PA-C 07/01/17 Clovis Pu, MD 07/13/17 [phone]

## 2017-06-30 NOTE — Discharge Instructions (Signed)
You are given acyclovir to treat the ulcerations in your mouth.  Please take this medication fully.  You are also given pain medication as well, you should not work, drive, or operate machinery while you are taking this medication.  Please continue take Tylenol and ibuprofen for your pain as well.  Do not take more than 4000 g of Tylenol in a day.  You need to keep your appointment with the ENT doctor tomorrow.  You will need to return to the emergency department for any new or worsening symptoms including inability to swallow, difficulty breathing, throat swelling, or any new or worsening symptoms.

## 2017-07-23 ENCOUNTER — Ambulatory Visit: Payer: Managed Care, Other (non HMO)

## 2017-08-09 ENCOUNTER — Ambulatory Visit
Admission: RE | Admit: 2017-08-09 | Discharge: 2017-08-09 | Disposition: A | Discharge status: Home / Self Care | Payer: Managed Care, Other (non HMO) | Source: Ambulatory Visit | Attending: Obstetrics and Gynecology | Admitting: Obstetrics and Gynecology

## 2017-08-09 DIAGNOSIS — Z1231 Encounter for screening mammogram for malignant neoplasm of breast: Secondary | ICD-10-CM

## 2018-04-30 HISTORY — PX: BREAST BIOPSY: SHX20

## 2018-05-23 ENCOUNTER — Other Ambulatory Visit: Payer: Self-pay | Admitting: Obstetrics and Gynecology

## 2018-05-23 DIAGNOSIS — N63 Unspecified lump in unspecified breast: Secondary | ICD-10-CM

## 2018-05-23 DIAGNOSIS — N644 Mastodynia: Secondary | ICD-10-CM

## 2018-05-27 ENCOUNTER — Other Ambulatory Visit: Payer: Self-pay | Admitting: Obstetrics and Gynecology

## 2018-05-27 ENCOUNTER — Ambulatory Visit
Admission: RE | Admit: 2018-05-27 | Discharge: 2018-05-27 | Disposition: A | Discharge status: Home / Self Care | Payer: Managed Care, Other (non HMO) | Source: Ambulatory Visit | Attending: Obstetrics and Gynecology | Admitting: Obstetrics and Gynecology

## 2018-05-27 DIAGNOSIS — N644 Mastodynia: Secondary | ICD-10-CM

## 2018-05-27 DIAGNOSIS — N63 Unspecified lump in unspecified breast: Secondary | ICD-10-CM

## 2018-05-29 ENCOUNTER — Ambulatory Visit
Admission: RE | Admit: 2018-05-29 | Discharge: 2018-05-29 | Disposition: A | Discharge status: Home / Self Care | Payer: Managed Care, Other (non HMO) | Source: Ambulatory Visit | Attending: Obstetrics and Gynecology | Admitting: Obstetrics and Gynecology

## 2018-05-29 DIAGNOSIS — N63 Unspecified lump in unspecified breast: Secondary | ICD-10-CM

## 2018-05-29 DIAGNOSIS — N644 Mastodynia: Secondary | ICD-10-CM

## 2018-05-30 ENCOUNTER — Telehealth: Payer: Self-pay | Admitting: Hematology and Oncology

## 2018-05-30 ENCOUNTER — Encounter: Payer: Self-pay | Admitting: *Deleted

## 2018-05-30 ENCOUNTER — Other Ambulatory Visit: Payer: Self-pay | Admitting: Obstetrics and Gynecology

## 2018-05-30 DIAGNOSIS — D0592 Unspecified type of carcinoma in situ of left breast: Secondary | ICD-10-CM

## 2018-05-30 NOTE — Telephone Encounter (Signed)
Spoke with patient to confirm afternoon [facility] appointment for 2/5, packet will be mailed and emailed per patients request

## 2018-06-02 ENCOUNTER — Other Ambulatory Visit: Payer: Self-pay | Admitting: *Deleted

## 2018-06-02 DIAGNOSIS — Z171 Estrogen receptor negative status [ER-]: Principal | ICD-10-CM

## 2018-06-02 DIAGNOSIS — C50411 Malignant neoplasm of upper-outer quadrant of right female breast: Secondary | ICD-10-CM | POA: Insufficient documentation

## 2018-06-03 ENCOUNTER — Ambulatory Visit
Admission: RE | Admit: 2018-06-03 | Discharge: 2018-06-03 | Disposition: A | Discharge status: Home / Self Care | Payer: Managed Care, Other (non HMO) | Source: Ambulatory Visit | Attending: Obstetrics and Gynecology | Admitting: Obstetrics and Gynecology

## 2018-06-03 DIAGNOSIS — D0592 Unspecified type of carcinoma in situ of left breast: Secondary | ICD-10-CM

## 2018-06-04 ENCOUNTER — Encounter: Payer: Self-pay | Admitting: Physical Therapy

## 2018-06-04 ENCOUNTER — Inpatient Hospital Stay (HOSPITAL_BASED_OUTPATIENT_CLINIC_OR_DEPARTMENT_OTHER): Payer: Managed Care, Other (non HMO) | Admitting: Hematology and Oncology

## 2018-06-04 ENCOUNTER — Ambulatory Visit
Admission: RE | Admit: 2018-06-04 | Discharge: 2018-06-04 | Disposition: A | Discharge status: Home / Self Care | Payer: Managed Care, Other (non HMO) | Source: Ambulatory Visit | Attending: Radiation Oncology | Admitting: Radiation Oncology

## 2018-06-04 ENCOUNTER — Encounter: Payer: Self-pay | Admitting: Hematology and Oncology

## 2018-06-04 ENCOUNTER — Other Ambulatory Visit: Payer: Self-pay

## 2018-06-04 ENCOUNTER — Encounter: Payer: Self-pay | Admitting: Medical Oncology

## 2018-06-04 ENCOUNTER — Inpatient Hospital Stay: Payer: Managed Care, Other (non HMO)

## 2018-06-04 ENCOUNTER — Ambulatory Visit: Payer: Managed Care, Other (non HMO) | Attending: General Surgery | Admitting: Physical Therapy

## 2018-06-04 VITALS — BP 138/90 | HR 62 | Temp 98.5°F | Resp 18 | Ht 65.5 in | Wt 138.1 lb

## 2018-06-04 DIAGNOSIS — C50411 Malignant neoplasm of upper-outer quadrant of right female breast: Secondary | ICD-10-CM

## 2018-06-04 DIAGNOSIS — Z171 Estrogen receptor negative status [ER-]: Principal | ICD-10-CM

## 2018-06-04 DIAGNOSIS — D649 Anemia, unspecified: Secondary | ICD-10-CM | POA: Diagnosis not present

## 2018-06-04 DIAGNOSIS — I10 Essential (primary) hypertension: Secondary | ICD-10-CM

## 2018-06-04 LAB — CMP (CANCER CENTER ONLY)
ALT: 12 U/L (ref 0–44)
AST: 16 U/L (ref 15–41)
Albumin: 4.2 g/dL (ref 3.5–5.0)
Alkaline Phosphatase: 65 U/L (ref 38–126)
Anion gap: 9 (ref 5–15)
BUN: 13 mg/dL (ref 6–20)
CO2: 31 mmol/L (ref 22–32)
Calcium: 10.1 mg/dL (ref 8.9–10.3)
Chloride: 101 mmol/L (ref 98–111)
Creatinine: 0.95 mg/dL (ref 0.44–1.00)
GFR, Est AFR Am: >60 mL/min (ref >60)
GFR, Estimated: >60 mL/min (ref >60)
Glucose, Bld: 103 mg/dL — ABNORMAL HIGH (ref 70–99)
Potassium: 3.2 mmol/L — ABNORMAL LOW (ref 3.5–5.1)
Sodium: 141 mmol/L (ref 135–145)
Total Bilirubin: 0.8 mg/dL (ref 0.3–1.2)
Total Protein: 7 g/dL (ref 6.5–8.1)

## 2018-06-04 LAB — CBC WITH DIFFERENTIAL (CANCER CENTER ONLY)
Abs Immature Granulocytes: 0 10*3/uL (ref 0.00–0.07)
Basophils Absolute: 0 10*3/uL (ref 0.0–0.1)
Basophils Relative: 1 %
Eosinophils Absolute: 0.1 10*3/uL (ref 0.0–0.5)
Eosinophils Relative: 2 %
HCT: 38.6 % (ref 36.0–46.0)
Hemoglobin: 13 g/dL (ref 12.0–15.0)
Immature Granulocytes: 0 %
Lymphocytes Relative: 42 %
Lymphs Abs: 1.4 10*3/uL (ref 0.7–4.0)
MCH: 30.1 pg (ref 26.0–34.0)
MCHC: 33.7 g/dL (ref 30.0–36.0)
MCV: 89.4 fL (ref 80.0–100.0)
Monocytes Absolute: 0.4 10*3/uL (ref 0.1–1.0)
Monocytes Relative: 12 %
Neutro Abs: 1.4 10*3/uL — ABNORMAL LOW (ref 1.7–7.7)
Neutrophils Relative %: 43 %
Platelet Count: 222 10*3/uL (ref 150–400)
RBC: 4.32 MIL/uL (ref 3.87–5.11)
RDW: 12.4 % (ref 11.5–15.5)
WBC: 3.2 10*3/uL — AB (ref 4.0–10.5)
nRBC: 0 % (ref 0.0–0.2)

## 2018-06-04 MED ORDER — ONDANSETRON HCL 8 MG PO TABS: 8.0000 mg | ORAL_TABLET | Freq: Two times a day (BID) | ORAL | 1 refills | Status: DC | PRN

## 2018-06-04 MED ORDER — LIDOCAINE-PRILOCAINE 2.5-2.5 % EX CREA: TOPICAL_CREAM | CUTANEOUS | 3 refills | Status: DC

## 2018-06-04 MED ORDER — PROCHLORPERAZINE MALEATE 10 MG PO TABS: 10.0000 mg | ORAL_TABLET | Freq: Four times a day (QID) | ORAL | 1 refills | Status: DC | PRN

## 2018-06-04 MED ORDER — LORAZEPAM 0.5 MG PO TABS: 0.5000 mg | ORAL_TABLET | Freq: Every evening | ORAL | 0 refills | Status: DC | PRN

## 2018-06-04 MED ORDER — DEXAMETHASONE 4 MG PO TABS: 4.0000 mg | ORAL_TABLET | Freq: Every day | ORAL | 0 refills | Status: DC

## 2018-06-04 NOTE — Assessment & Plan Note (Signed)
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 After reviewing the pathology in detail, we proceeded to discuss the different treatment options between surgery, radiation, chemotherapy, antiestrogen therapies.  Recommendation based on multidisciplinary tumor board: 1. Neoadjuvant chemotherapy with Adriamycin and Cytoxan dose dense 4 followed by Taxol weekly 12 and carboplatin every 3 weeks 2. Followed by breast conserving surgery with sentinel lymph node study  3. Followed by adjuvant radiation therapy   Chemotherapy Counseling: I discussed the risks and benefits of chemotherapy including the risks of nausea/ vomiting, risk of infection from low WBC count, fatigue due to chemo or anemia, bruising or bleeding due to low platelets, mouth sores, loss/ change in taste and decreased appetite. Liver and kidney function will be monitored through out chemotherapy as abnormalities in liver and kidney function may be a side effect of treatment. Cardiac dysfunction due to Adriamycin was discussed in detail. Risk of permanent bone marrow dysfunction due to chemo were also discussed.  Plan: 1. Port placement to be done next Monday 2. Echocardiogram 3. Chemotherapy class 4. Breast MRI 5. Genetic counseling will also be arranged  UPBEAT clinical trial ([place]): Newly diagnosed stage I to III breast cancer patients receiving either adjuvant or neoadjuvant chemotherapy undergo cardiac MRI before treatment and at 24 months along with  neurocognitive testing, exercise and disability measures at baseline 3, 12 and 24 months.  Return to clinic in 1-2 weeks to start chemotherapy.

## 2018-06-04 NOTE — Progress Notes (Signed)
Radiation Oncology         [phone] ________________________________  Name: Katrina Liu        MRN: [id number]  Date of Service: 06/04/2018 DOB: 1960/12/29  [id number], [name], MD  [name], MD     REFERRING PHYSICIAN: [name], MD   DIAGNOSIS: The encounter diagnosis was Malignant neoplasm of upper-outer quadrant of right breast in female, estrogen receptor negative ([place]).  Cancer Staging Malignant neoplasm of upper-outer quadrant of right breast in female, estrogen receptor negative ([place]) Staging form: Breast, AJCC 8th Edition - Clinical stage from 06/04/2018: Stage IB (cT1b, cN0, cM0, G3, ER-, PR-, HER2-) - Signed by Nicholas Lose, MD on 06/04/2018  Stage IB, T1b, Nx, Mx, Right Breast, UOQ, Invasive Ductal Carcinoma, Triple negative, grade 3  HISTORY OF PRESENT ILLNESS: Katrina Liu is a 58 y.o. female seen in the multidisciplinary breast clinic for a new diagnosis of right breast cancer. The patient was noted to have right axilla pain.  She underwent unilateral right diagnostic mammography with tomography and right breast ultrasonography on 05/27/2018 showing: There is a new suspicious mass in the right breast at 10 o'clock. No evidence of right axillary lymphadenopathy.  Accordingly on 05/29/2018 she proceeded to biopsy of the right breast area in question. The pathology from this procedure showed: Breast, right, needle core biopsy, 10 o'clock with invasive ductal carcinoma, grade 3.  Prognostic indicators significant for: ER, PR, and HER 2 negative. Proliferation marker Ki67 at 20%.    PREVIOUS RADIATION THERAPY: No   PAST MEDICAL HISTORY:  Past Medical History:  Diagnosis Date  . Anemia   . GERD (gastroesophageal reflux disease)    h/o - not now  . Headache(784.0)   . Hypertension   . S/P abdominal hysterectomy 04/17/2011  . Stroke [facility])        PAST SURGICAL HISTORY: Past Surgical History:  Procedure Laterality Date  . ABDOMINAL HYSTERECTOMY  04/16/2011   Procedure: HYSTERECTOMY ABDOMINAL;  Surgeon: Marvene Staff, MD;  Location: [facility];  Service: Gynecology;  Laterality: N/A;  . BREAST BIOPSY Left 2013  . KIDNEY STONE SURGERY    . LAPAROTOMY     ectopic pregnancy  . LOOP RECORDER IMPLANT N/A 12/24/2012   Procedure: LOOP RECORDER IMPLANT;  Surgeon: Deboraha Sprang, MD;  Location: [facility] CATH LAB;  Service: Cardiovascular;  Laterality: N/A;  . LOOP RECORDER REMOVAL N/A 08/15/2016   Procedure: Loop Recorder Removal;  Surgeon: Deboraha Sprang, MD;  Location: [facility] CV [facility];  Service: Cardiovascular;  Laterality: N/A;  . SHOULDER ARTHROSCOPY    . TEE WITHOUT CARDIOVERSION N/A 12/24/2012   Procedure: TRANSESOPHAGEAL ECHOCARDIOGRAM (TEE);  Surgeon: Jettie Booze, MD;  Location: [facility]OSCOPY;  Service: Cardiovascular;  Laterality: N/A;     FAMILY HISTORY:  Family History  Problem Relation Age of Onset  . Stroke Father 70  . Lung cancer Father   . Prostate cancer Father   . Breast cancer Neg Hx      SOCIAL HISTORY:  reports that she has never smoked. She has never used smokeless tobacco. She reports that she does not drink alcohol or use drugs.   ALLERGIES: Lisinopril   MEDICATIONS:  Current Outpatient Medications  Medication Sig Dispense Refill  . Ascorbic Acid (VITAMIN C) 500 MG CAPS Take 500 mg by mouth daily.     [name] aspirin EC 81 MG EC tablet Take 1 tablet (81 mg total) by mouth daily. 90 tablet 0  . Biotin 10000 MCG TABS Take  10,000 mcg by mouth daily.    . Calcium Carbonate-Vitamin D (CALCIUM 500/D PO) Take by mouth.    . chlorthalidone (HYGROTON) 25 MG tablet Take 1 tablet (25 mg total) by mouth daily. (Patient not taking: Reported on 06/04/2018) 90 tablet 2  . Cyanocobalamin (VITAMIN B 12 PO) Take by mouth.    . dexamethasone (DECADRON) 4 MG tablet Take 1 tablet (4 mg total) by mouth daily. Take 1 tablet day after chemo and 1 tablet 2 days after chemo with food 8 tablet 0  . Flaxseed, Linseed, (FLAX SEED OIL) 1000 MG CAPS  Take 1,000 mg by mouth once a week.    . hydrochlorothiazide (HYDRODIURIL) 25 MG tablet Take 25 mg by mouth daily.    [name] HYDROcodone-Acetaminophen (LORTAB) 10-300 MG/15ML SOLN Take 5 mLs by mouth every 6 (six) hours as needed. Do not drive, work, or operate machinery while taking this medication (Patient not taking: Reported on 06/04/2018) 100 mL 0  . labetalol (NORMODYNE) 100 MG tablet Take 1 tablet (100 mg total) by mouth 2 (two) times daily. 180 tablet 3  . lidocaine-prilocaine (EMLA) cream Apply to affected area once 30 g 3  . LORazepam (ATIVAN) 0.5 MG tablet Take 1 tablet (0.5 mg total) by mouth at bedtime as needed (Nausea or vomiting). 30 tablet 0  . mometasone (ELOCON) 0.1 % ointment Apply 1 application topically as needed (dry skin).     . Multiple Vitamin (MULTIVITAMIN) tablet Take 1 tablet by mouth daily.    . ondansetron (ZOFRAN) 8 MG tablet Take 1 tablet (8 mg total) by mouth 2 (two) times daily as needed. Start on the third day after chemotherapy. 30 tablet 1  . prochlorperazine (COMPAZINE) 10 MG tablet Take 1 tablet (10 mg total) by mouth every 6 (six) hours as needed (Nausea or vomiting). 30 tablet 1  . Turmeric 500 MG TABS Take 500 mg by mouth daily.    [name] zinc gluconate 50 MG tablet Take 50 mg by mouth daily.     No current facility-administered medications for this encounter.      REVIEW OF SYSTEMS: On review of systems, the patient reports that she is doing well overall. She denies any chest pain, shortness of breath, cough, fevers, chills, night sweats, unintended weight changes. She denies any bowel or bladder disturbances, and denies abdominal pain, nausea or vomiting. She denies any new musculoskeletal or joint aches or pains. A complete review of systems is obtained and is otherwise negative.     PHYSICAL EXAM:  Wt Readings from Last 3 Encounters:  06/04/18 138 lb 1.6 oz (62.6 kg)  08/15/16 143 lb (64.9 kg)  07/27/16 144 lb (65.3 kg)   Temp Readings from Last 3  Encounters:  06/04/18 98.5 F (36.9 C) (Oral)  06/30/17 99.5 F (37.5 C) (Oral)  08/15/16 97.8 F (36.6 C) (Oral)   BP Readings from Last 3 Encounters:  06/04/18 138/90  06/30/17 131/85  08/15/16 (!) 149/84   Pulse Readings from Last 3 Encounters:  06/04/18 62  06/30/17 88  08/15/16 73     In general this is a well appearing African-American female in no acute distress. She is alert and oriented x4 and appropriate throughout the examination. HEENT reveals that the patient is normocephalic, atraumatic. EOMs are intact. Skin is intact without any evidence of gross lesions. Cardiovascular exam reveals a regular rate and rhythm, no clicks rubs or murmurs are auscultated. Chest is clear to auscultation bilaterally. Lymphatic assessment is performed and does not reveal  any adenopathy in the cervical, supraclavicular, axillary, or inguinal chains. Bilateral breast exam is performed and reveals small palpable tumor superolateral in the UOQ. Bilateral axilla without any palpable masses. Abdomen has active bowel sounds in all quadrants and is intact. The abdomen is soft, non tender, non distended. Lower extremities are negative for pretibial pitting edema, deep calf tenderness, cyanosis or clubbing.   ECOG = 0  0 - Asymptomatic (Fully active, able to carry on all predisease activities without restriction)  1 - Symptomatic but completely ambulatory (Restricted in physically strenuous activity but ambulatory and able to carry out work of a light or sedentary nature. For example, light housework, office work)  2 - Symptomatic, <50% in bed during the day (Ambulatory and capable of all self care but unable to carry out any work activities. Up and about more than 50% of waking hours)  3 - Symptomatic, >50% in bed, but not bedbound (Capable of only limited self-care, confined to bed or chair 50% or more of waking hours)  4 - Bedbound (Completely disabled. Cannot carry on any self-care. Totally  confined to bed or chair)  5 - Death   Eustace Pen MM, Creech RH, Tormey DC, et al. [phone]). "Toxicity and response criteria of the [facility]". [place] Oncol. 5 (6): 649-55    LABORATORY DATA:  Lab Results  Component Value Date   WBC 3.2 (L) 06/04/2018   HGB 13.0 06/04/2018   HCT 38.6 06/04/2018   MCV 89.4 06/04/2018   PLT 222 06/04/2018   Lab Results  Component Value Date   NA 141 06/04/2018   K 3.2 (L) 06/04/2018   CL 101 06/04/2018   CO2 31 06/04/2018   Lab Results  Component Value Date   ALT 12 06/04/2018   AST 16 06/04/2018   ALKPHOS 65 06/04/2018   BILITOT 0.8 06/04/2018      RADIOGRAPHY: US Breast Ltd Uni Right Inc Axilla  Result Date: 05/27/2018 CLINICAL DATA:  58 year old female presenting for evaluation of 6 days of pain in the right axilla. EXAM: DIGITAL DIAGNOSTIC RIGHT MAMMOGRAM WITH CAD AND TOMO ULTRASOUND RIGHT BREAST COMPARISON:  Previous exam(s). ACR Breast Density Category c: The breast tissue is heterogeneously dense, which may obscure small masses. FINDINGS: A BB has been placed over the site of tenderness in the right axilla. In the vicinity of the BB there is an irregular mass measuring approximately 9 mm. Mammographic images were processed with CAD. On physical exam, there is a small firm mass at the palpable site in the upper-outer right breast/low axilla. Ultrasound of the right breast at 10 o'clock, 9 cm from the nipple demonstrates an irregular hypoechoic mass with indistinct margins and vascular flow measuring 8 x 7 x 8 mm. Multiple normal-appearing lymph nodes are identified in the right axilla. IMPRESSION: 1. There is a new suspicious mass in the right breast at 10 o'clock. 2.  No evidence of right axillary lymphadenopathy. RECOMMENDATION: Ultrasound guided biopsy is recommended for the palpable right breast mass at 10 o'clock. The procedure has been scheduled for 05/29/2018 at 1:45 p.m. I have discussed the findings and  recommendations with the patient. Results were also provided in writing at the conclusion of the visit. If applicable, a reminder letter will be sent to the patient regarding the next appointment. BI-RADS CATEGORY  4: Suspicious. Electronically Signed   By: Ammie Ferrier M.D.   On: 05/27/2018 11:32   Mm Diag Breast Tomo Uni Left  Result Date: 06/03/2018 CLINICAL  DATA:  Recent diagnosis of RIGHT breast cancer. Rule out contralateral malignancy in the LEFT breast. EXAM: DIGITAL DIAGNOSTIC UNILATERAL LEFT MAMMOGRAM WITH CAD AND TOMO COMPARISON:  Previous exam(s). ACR Breast Density Category b: There are scattered areas of fibroglandular density. FINDINGS: There are new dominant masses, suspicious calcifications or secondary signs of malignancy within the LEFT breast. Biopsy clip within the LEFT breast is stable in position. Mammographic images were processed with CAD. IMPRESSION: No evidence of malignancy within the LEFT breast. RECOMMENDATION: Per current treatment plan for patient's recently diagnosed RIGHT breast cancer. I have discussed the findings and recommendations with the patient. Results were also provided in writing at the conclusion of the visit. If applicable, a reminder letter will be sent to the patient regarding the next appointment. BI-RADS CATEGORY  1: Negative. However, recent diagnosis of RIGHT breast cancer. Electronically Signed   By: Franki Cabot M.D.   On: 06/03/2018 07:54   Mm Diag Breast Tomo Uni Right  Result Date: 05/27/2018 CLINICAL DATA:  58 year old female presenting for evaluation of 6 days of pain in the right axilla. EXAM: DIGITAL DIAGNOSTIC RIGHT MAMMOGRAM WITH CAD AND TOMO ULTRASOUND RIGHT BREAST COMPARISON:  Previous exam(s). ACR Breast Density Category c: The breast tissue is heterogeneously dense, which may obscure small masses. FINDINGS: A BB has been placed over the site of tenderness in the right axilla. In the vicinity of the BB there is an irregular mass  measuring approximately 9 mm. Mammographic images were processed with CAD. On physical exam, there is a small firm mass at the palpable site in the upper-outer right breast/low axilla. Ultrasound of the right breast at 10 o'clock, 9 cm from the nipple demonstrates an irregular hypoechoic mass with indistinct margins and vascular flow measuring 8 x 7 x 8 mm. Multiple normal-appearing lymph nodes are identified in the right axilla. IMPRESSION: 1. There is a new suspicious mass in the right breast at 10 o'clock. 2.  No evidence of right axillary lymphadenopathy. RECOMMENDATION: Ultrasound guided biopsy is recommended for the palpable right breast mass at 10 o'clock. The procedure has been scheduled for 05/29/2018 at 1:45 p.m. I have discussed the findings and recommendations with the patient. Results were also provided in writing at the conclusion of the visit. If applicable, a reminder letter will be sent to the patient regarding the next appointment. BI-RADS CATEGORY  4: Suspicious. Electronically Signed   By: Ammie Ferrier M.D.   On: 05/27/2018 11:32   Mm Clip Placement Right  Result Date: 05/29/2018 CLINICAL DATA:  Post ultrasound-guided biopsy of a mass in the right breast at 10 o'clock far lateral. EXAM: DIAGNOSTIC RIGHT MAMMOGRAM POST ULTRASOUND BIOPSY COMPARISON:  Previous exam(s). FINDINGS: Mammographic images were obtained following ultrasound guided biopsy of a mass in the right breast at 10 o'clock far lateral. A ribbon shaped biopsy marking clip is present at the site of the biopsied mass in the right breast. A moderate size nearly 4 cm hematoma is present at the biopsy site in the right breast. IMPRESSION: 1. Ribbon shaped biopsy marking clip at site of biopsied mass in the right breast. 2.  Moderate size post biopsy hematoma. Final Assessment: Post Procedure Mammograms for Marker Placement Electronically Signed   By: Everlean Alstrom M.D.   On: 05/29/2018 14:31   [place] Rt Breast Bx W Loc Dev 1st  Lesion Img Bx Spec US Guide  Addendum Date: 06/03/2018   ADDENDUM REPORT: 06/02/2018 08:00 ADDENDUM: Pathology revealed GRADE III INVASIVE DUCTAL CARCINOMA of the RIGHT  breast, 10 o'clock. This was found to be concordant by Dr. Everlean Alstrom. Pathology results were discussed with the patient by telephone. The patient reported doing well after the biopsy with tenderness and minimal bleeding at the site. Post biopsy instructions and care were reviewed and questions were answered. The patient was encouraged to call [facility] for any additional concerns. The patient was referred to [facility] on June 04, 2018. The patient is scheduled for a LEFT diagnostic mammogram on June 03, 2018 at [facility]. Pathology results reported by Terie Purser, RN on 06/02/2018. Electronically Signed   By: Everlean Alstrom M.D.   On: 06/02/2018 08:00   Result Date: 06/03/2018 CLINICAL DATA:  58 year old female with a suspicious mass in the right breast at the 10 o'clock far outer position. EXAM: ULTRASOUND GUIDED RIGHT BREAST CORE NEEDLE BIOPSY COMPARISON:  Previous exam(s). FINDINGS: I met with the patient and we discussed the procedure of ultrasound-guided biopsy, including benefits and alternatives. We discussed the high likelihood of a successful procedure. We discussed the risks of the procedure, including infection, bleeding, tissue injury, clip migration, and inadequate sampling. Informed written consent was given. The usual time-out protocol was performed immediately prior to the procedure. Lesion quadrant: Upper-outer Using sterile technique and 1% Lidocaine as local anesthetic, under direct ultrasound visualization, a 14 gauge spring-loaded device was used to perform biopsy of the mass in the right breast at the 10 o'clock position using a lateral to medial approach. At the conclusion of the procedure a ribbon  shaped tissue marker clip was deployed into the biopsy cavity. Follow up 2 view mammogram was performed and dictated separately. IMPRESSION: Ultrasound guided biopsy of the mass in the right breast at the 10 o'clock position. No apparent complications. Electronically Signed: By: Everlean Alstrom M.D. On: 05/29/2018 14:17       IMPRESSION/PLAN: 1. Stage IB, T1b, Nx, Mx, Right Breast, UOQ, Invasive Ductal Carcinoma, Triple negative, grade 3  Per Dr. Landis Gandy' recommendations, patient will have genetics testing, an echocardiogram completed along with referral to chemotherapy class. Patient will also undergo neoadjuvant chemotherapy with port placement. Per Dr. Sharee Pimple' recommendations, the patient will undergo right lumpectomy with sentinel node biopsy. Discussed that radiation is a standard recommendation for this circumstance, discussed the risks if radiation isn't completed as well with the patient. If chemotherapy were to be given, it will precede radiation therapy. Patient advised that the risks are but not limited to, skin irritation and mild fatigue towards the end of radiation treatments. Discussed with the patient that any and all side effects from radiation will be monitored and that the patien twill be given a cream during radiation treatment to help with skin irritation. Advised that the skin irritation will occur 2-3 weeks into radiation treatment and that we will monitor it as needed. Patient will have radiation completed 4 weeks s/p right lumpectomy and sentinel node biopsy to the whole breast with an appointment prior to simulate the patient. She will receive 6 weeks of radiation to the right breast to prevent locoregional recurrence.   Advised use of annual mammograms after completion of treatment and stressed the importance of this.   Patient will follow up in the office after her surgery to be scheduled for CT simulation with treatments starting shortly afterwards. She knows to call with  any questions or concerns.     ------------------------------------------------  Jodelle Gross, MD, PhD  This document serves as a record of services personally performed by Kyung Rudd, MD. It was created on his behalf by Steva Colder, a trained medical scribe. The creation of this record is based on the scribe's personal observations and the provider's statements to them. This document has been checked and approved by the attending provider.

## 2018-06-04 NOTE — Progress Notes (Signed)
[place] Psychosocial Distress Screening Spiritual Care  Met with Katrina Liu in [facility] to introduce [facility] team/resources, reviewing distress screen per protocol.  The patient scored a 1 on the Psychosocial Distress Thermometer which indicates mild distress. Also assessed for distress and other psychosocial needs.   ONCBCN DISTRESS SCREENING 06/04/2018  Screening Type Initial Screening  Distress experienced in past week (1-10) 1  Information Concerns Type Lack of info about diagnosis;Lack of info about treatment;Lack of info about complementary therapy choices  Referral to support programs Yes   The pt presented to Breast Clinic with her daughter. The pt expressed that since receiving additional information about her diagnosis and treatment plan, her distress has decreased to a 0, indicating no distress. The pt identified close friends, family, and her faith as supportive resources. The pt expressed no current interest in support programs, but she reported that she will follow up if any needs arise.  Follow up needed: No.  Doris Cheadle, Counseling Intern [phone]

## 2018-06-04 NOTE — Progress Notes (Signed)
START ON PATHWAY REGIMEN - Breast     A cycle is every 14 days (cycles 1-4):     Doxorubicin      Cyclophosphamide      [id number]-[id number]    A cycle is every 21 days (cycles 5-8):     Paclitaxel      Carboplatin   **Always confirm dose/schedule in your pharmacy ordering system**  Patient Characteristics: Preoperative or Nonsurgical Candidate (Clinical Staging), Neoadjuvant Therapy followed by Surgery, Invasive Disease, Chemotherapy, HER2 Negative/Unknown/Equivocal, ER Negative/Unknown, Platinum Therapy Indicated Therapeutic Status: Preoperative or Nonsurgical Candidate (Clinical Staging) AJCC M Category: cM0 AJCC Grade: G3 Breast Surgical Plan: Neoadjuvant Therapy followed by Surgery ER Status: Negative (-) AJCC 8 Stage Grouping: IB HER2 Status: Negative (-) AJCC T Category: cT1b AJCC N Category: cN0 PR Status: Negative (-) Type of Therapy: Platinum Therapy Indicated Intent of Therapy: Curative Intent, Discussed with Patient

## 2018-06-04 NOTE — Patient Instructions (Signed)

## 2018-06-04 NOTE — Progress Notes (Signed)
[place] NOTE  Patient Care Team: [name], MD as PCP - General (Family Medicine) Excell Seltzer, MD as Consulting Physician (General Surgery) Nicholas Lose, MD as Consulting Physician (Hematology and Oncology) Kyung Rudd, MD as Consulting Physician (Radiation Oncology)  CHIEF COMPLAINTS/PURPOSE OF CONSULTATION: Newly diagnosed breast cancer  HISTORY OF PRESENTING ILLNESS:  Katrina Liu 58 y.o. 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 The cancer was detected on a diagnostic mammogram on 05/27/18 after the patient reported 6 days of pain in the right axilla and a palpable lump. It showed a 90m mass in the right breast at the 10 o'clock position and no right axillary lymphadenopathy. A biopsy from 05/29/18 showed the cancer to be grade 3, ER/PR negative, HER2 negative, Ki67 20%. A diagnostic mammogram on 06/03/18 showed no evidence of malignancy in the left breast.   She presents to the clinic today with her daughter. She denies any family history of breast cancer and notes one uncle who died of prostate cancer. She reports she has previously been anemic and had Hg as low as 6. She has a history of transient ischemic attack. She has a history of hysterectomy. She is concerned about insertion of her port and losing her hair. She is a very spiritual person and is very physically active and motivated to start treatment. 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   I reviewed her records extensively and collaborated the history with the patient.  SUMMARY OF ONCOLOGIC HISTORY:   Malignant neoplasm of upper-outer quadrant of right breast in female, estrogen receptor negative ([facility] [place]   05/29/2018 Initial Diagnosis    Right axillary pain which led to a mammogram and  ultrasound that revealed a new mass in the superior UOQ tail of the breast 10 o'clock position by ultrasound measured 8 mm, axilla negative, biopsy revealed grade 3 IDC triple negative with a Ki-67 of 20%, T1BN0 stage Ib clinical stage    06/04/2018 Cancer Staging    Staging form: Breast, AJCC 8th Edition - Clinical stage from 06/04/2018: Stage IB (cT1b, cN0, cM0, G3, ER-, PR-, HER2-) - Signed by GNicholas Lose MD on 06/04/2018    MEDICAL HISTORY:  Past Medical History:  Diagnosis Date  . Anemia   . GERD (gastroesophageal reflux disease)    h/o - not now  . Headache(784.0)   . Hypertension   . S/P abdominal hysterectomy 04/17/2011  . Stroke ([facility]     SURGICAL HISTORY: Past Surgical History:  Procedure Laterality Date  . ABDOMINAL HYSTERECTOMY  04/16/2011   Procedure: HYSTERECTOMY ABDOMINAL;  Surgeon: SMarvene Staff MD;  [facility];  Service: Gynecology;  Laterality: N/A;  . BREAST BIOPSY Left 2013  . KIDNEY STONE SURGERY    . LAPAROTOMY     ectopic pregnancy  . LOOP RECORDER IMPLANT N/A 12/24/2012   Procedure: LOOP RECORDER IMPLANT;  Surgeon: SDeboraha Sprang MD;  [facility];  Service: Cardiovascular;  Laterality: N/A;  . LOOP RECORDER REMOVAL N/A 08/15/2016   Procedure: Loop Recorder Removal;  Surgeon: SDeboraha Sprang MD;  [place];  Service: Cardiovascular;  Laterality: N/A;  . SHOULDER ARTHROSCOPY    . TEE WITHOUT CARDIOVERSION N/A 12/24/2012   Procedure: TRANSESOPHAGEAL ECHOCARDIOGRAM (TEE);  Surgeon: JJettie Booze MD;  [facility];  Service: Cardiovascular;  Laterality: N/A;    SOCIAL HISTORY: Social History   Socioeconomic History  . Marital status: Divorced    Spouse name: Not on file  . Number of children: 1  . Years of education: 54  . Highest education level: Not on file  Occupational History  . Occupation: ADM SERVICE [employer]    Employer: [employer]  Social Needs  . Financial resource strain: Not  on file  . Food insecurity:    Worry: Not on file    Inability: Not on file  . Transportation needs:    Medical: Not on file    Non-medical: Not on file  Tobacco Use  . Smoking status: Never Smoker  . Smokeless tobacco: Never Used  Substance and Sexual Activity  . Alcohol use: No    Alcohol/week: 0.0 standard drinks  . Drug use: No  . Sexual activity: Not on file  Lifestyle  . Physical activity:    Days per week: Not on file    Minutes per session: Not on file  . Stress: Not on file  Relationships  . Social connections:    Talks on phone: Not on file    Gets together: Not on file    Attends religious service: Not on file    Active member of club or organization: Not on file    Attends meetings of clubs or organizations: Not on file    Relationship status: Not on file  . Intimate partner violence:    Fear of current or ex partner: Not on file    Emotionally abused: Not on file    Physically abused: Not on file    Forced sexual activity: Not on file  Other Topics Concern  . Not on file  Social History Narrative   Patient is single with one child.   Patient is left handed.   Patient has hs education.   Patient drinks 1/2 cup daily.    FAMILY HISTORY: Family History  Problem Relation Age of Onset  . Stroke Father 40  . Lung cancer Father   . Prostate cancer Father   . Breast cancer Neg Hx     ALLERGIES:  is allergic to lisinopril.  MEDICATIONS:  Current Outpatient Medications  Medication Sig Dispense Refill  . Ascorbic Acid (VITAMIN C) 500 MG CAPS Take 500 mg by mouth daily.     [name] aspirin EC 81 MG EC tablet Take 1 tablet (81 mg total) by mouth daily. 90 tablet 0  . Biotin 10000 MCG TABS Take 10,000 mcg by mouth daily.    . Calcium Carbonate-Vitamin D (CALCIUM 500/D PO) Take by mouth.    . Cyanocobalamin (VITAMIN B 12 PO) Take by mouth.    . hydrochlorothiazide (HYDRODIURIL) 25 MG tablet Take 25 mg by mouth daily.    [name] labetalol (NORMODYNE) 100 MG tablet Take  1 tablet (100 mg total) by mouth 2 (two) times daily. 180 tablet 3  . mometasone (ELOCON) 0.1 % ointment Apply 1 application topically as needed (dry skin).     . Multiple Vitamin (MULTIVITAMIN) tablet Take 1 tablet by mouth daily.    . Turmeric 500 MG TABS Take 500 mg by mouth daily.    . chlorthalidone (HYGROTON) 25 MG tablet Take 1 tablet (25 mg total) by mouth daily. (Patient not taking: Reported on 06/04/2018) 90 tablet 2  . Flaxseed, Linseed, (FLAX SEED OIL) 1000 MG CAPS Take 1,000 mg by mouth once a week.    [name]  HYDROcodone-Acetaminophen (LORTAB) 10-300 MG/15ML SOLN Take 5 mLs by mouth every 6 (six) hours as needed. Do not drive, work, or operate machinery while taking this medication (Patient not taking: Reported on 06/04/2018) 100 mL 0  . zinc gluconate 50 MG tablet Take 50 mg by mouth daily.     No current facility-administered medications for this visit.     REVIEW OF SYSTEMS:   Constitutional: Denies fevers, chills or abnormal night sweats Eyes: Denies blurriness of vision, double vision or watery eyes Ears, nose, mouth, throat, and face: Denies mucositis or sore throat Respiratory: Denies cough, dyspnea or wheezes Cardiovascular: Denies palpitation, chest discomfort or lower extremity swelling Gastrointestinal:  Denies nausea, heartburn or change in bowel habits Skin: Denies abnormal skin rashes Lymphatics: Denies new lymphadenopathy or easy bruising Neurological:Denies numbness, tingling or new weaknesses Behavioral/Psych: Mood is stable, no new changes  Breast: Denies any discharge (+) palpable lump in right breast (+) pain in right axilla All other systems were reviewed with the patient and are negative.  PHYSICAL EXAMINATION: ECOG PERFORMANCE STATUS: 0 - Asymptomatic  Vitals:   06/04/18 1313  BP: 138/90  Pulse: 62  Resp: 18  Temp: 98.5 F (36.9 C)  SpO2: 99%   Filed Weights   06/04/18 1313  Weight: 62.6 kg    GENERAL:alert, no distress and comfortable SKIN: skin  color, texture, turgor are normal, no rashes or significant lesions EYES: normal, conjunctiva are pink and non-injected, sclera clear OROPHARYNX:no exudate, no erythema and lips, buccal mucosa, and tongue normal  NECK: supple, thyroid normal size, non-tender, without nodularity LYMPH:  no palpable lymphadenopathy in the cervical, axillary or inguinal LUNGS: clear to auscultation and percussion with normal breathing effort HEART: regular rate & rhythm and no murmurs and no lower extremity edema ABDOMEN:abdomen soft, non-tender and normal bowel sounds Musculoskeletal:no cyanosis of digits and no clubbing  PSYCH: alert & oriented x 3 with fluent speech NEURO: no focal motor/sensory deficits BREAST: Palpable lump in the axilla of right breast 1 and half centimeters in size with some bruising. No palpable axillary or supraclavicular lymphadenopathy (exam performed in the presence of a chaperone)   LABORATORY DATA:  I have reviewed the data as listed Lab Results  Component Value Date   WBC 3.2 (L) 06/04/2018   HGB 13.0 06/04/2018   HCT 38.6 06/04/2018   MCV 89.4 06/04/2018   PLT 222 06/04/2018   Lab Results  Component Value Date   NA 141 06/04/2018   K 3.2 (L) 06/04/2018   CL 101 06/04/2018   CO2 31 06/04/2018    RADIOGRAPHIC STUDIES: I have personally reviewed the radiological reports and agreed with the findings in the report.  ASSESSMENT AND PLAN:  Malignant neoplasm of upper-outer quadrant of right breast in female, estrogen receptor negative (HCC) 05/29/2018:Right axillary pain which led to a mammogram and ultrasound that revealed a new mass in the superior UOQ tail of the breast 10 o'clock position by ultrasound measured 8 mm, axilla negative, biopsy revealed grade 3 IDC triple negative with a Ki-67 of 20%, T1BN0 stage Ib clinical stage  Pathology and radiology counseling: Discussed with the patient, the details of pathology including the type of breast cancer,the clinical  staging, the significance of ER, PR and HER-2/neu receptors and the implications for treatment. After reviewing the pathology in detail, we proceeded to discuss the different treatment options between surgery, radiation, chemotherapy, antiestrogen therapies.  Recommendation based on multidisciplinary tumor board: 1. Neoadjuvant chemotherapy with Adriamycin and Cytoxan dose dense 4 followed  by Taxol weekly 12 and carboplatin every 3 weeks 2. Followed by breast conserving surgery with sentinel lymph node study  3. Followed by adjuvant radiation therapy   Chemotherapy Counseling: I discussed the risks and benefits of chemotherapy including the risks of nausea/ vomiting, risk of infection from low WBC count, fatigue due to chemo or anemia, bruising or bleeding due to low platelets, mouth sores, loss/ change in taste and decreased appetite. Liver and kidney function will be monitored through out chemotherapy as abnormalities in liver and kidney function may be a side effect of treatment. Cardiac dysfunction due to Adriamycin was discussed in detail. Risk of permanent bone marrow dysfunction due to chemo were also discussed.  Plan: 1. Port placement to be done next Monday 2. Echocardiogram 3. Chemotherapy class 4. Breast MRI 5. Genetic counseling will also be arranged  UPBEAT clinical trial ([place]): Newly diagnosed stage I to III breast cancer patients receiving either adjuvant or neoadjuvant chemotherapy undergo cardiac MRI before treatment and at 24 months along with neurocognitive testing, exercise and disability measures at baseline 3, 12 and 24 months.  Return to clinic in 1-2 weeks to start chemotherapy.   All questions were answered. The patient knows to call the clinic with any problems, questions or concerns.   Nicholas Lose, MD 06/04/2018   Julious Oka Dorshimer, am acting as scribe for Nicholas Lose, MD.  I have reviewed the above documentation for accuracy and completeness, and I  agree with the above.

## 2018-06-04 NOTE — Progress Notes (Signed)
UPBEAT Dr. Lindi Adie referred patient to study during [facility] today. I met with patient and patient's daughter briefly to introduce myself and the study. Patient confirms Dr. Lindi Adie gave her an explanation about the study as well. I provided patient with study authorization and study consent form for her review and consideration of participation. Due to time constraint, I was not able to go into detail about the study. Patient stated it was fine with her for me to follow up with her in a few days to see if she had any questions. I did inform patient that with this study, baseline assessments would need to be completed before the start of her treatments. Patient stated she will review the information provided to her today. Patient was also provided with a Clinical Trials Information pamphlet as well as my contact information and a DCP-001 authorization and consent form for her review, since she was referred to [place].  Patient thanked for her time and consideration of study and encouraged to call with questions.  Maxwell Marion, RN, BSN, [facility] Clinical Research 06/04/2018 4:08 PM

## 2018-06-04 NOTE — Therapy (Signed)
[place], [place], [address] Phone: [phone]   Fax:  [phone]  Physical Therapy Evaluation  Patient Details  Name: Katrina Liu MRN: [id number] Date of Birth: 11-Jul-1960 Referring Provider (PT): Dr. Excell Seltzer   Encounter Date: 06/04/2018  PT End of Session - 06/04/18 1702    Visit Number  1    Number of Visits  1    PT Start Time  1440    PT Stop Time  1502   Also saw pt from 1515-1530 for a total of 37 minutes   PT Time Calculation (min)  22 min    Activity Tolerance  Patient tolerated treatment well    Behavior During Therapy  [facility] for tasks assessed/performed       Past Medical History:  Diagnosis Date  . Anemia   . GERD (gastroesophageal reflux disease)    h/o - not now  . Headache(784.0)   . Hypertension   . S/P abdominal hysterectomy 04/17/2011  . Stroke [facility])     Past Surgical History:  Procedure Laterality Date  . ABDOMINAL HYSTERECTOMY  04/16/2011   Procedure: HYSTERECTOMY ABDOMINAL;  Surgeon: Marvene Staff, MD;  Location: [place] [facility];  Service: Gynecology;  Laterality: N/A;  . BREAST BIOPSY Left 2013  . KIDNEY STONE SURGERY    . LAPAROTOMY     ectopic pregnancy  . LOOP RECORDER IMPLANT N/A 12/24/2012   Procedure: LOOP RECORDER IMPLANT;  Surgeon: Deboraha Sprang, MD;  [facility] LAB;  Service: Cardiovascular;  Laterality: N/A;  . LOOP RECORDER REMOVAL N/A 08/15/2016   Procedure: Loop Recorder Removal;  Surgeon: Deboraha Sprang, MD;  Location: [place] CV LAB;  Service: Cardiovascular;  Laterality: N/A;  . SHOULDER ARTHROSCOPY    . TEE WITHOUT CARDIOVERSION N/A 12/24/2012   Procedure: TRANSESOPHAGEAL ECHOCARDIOGRAM (TEE);  Surgeon: Jettie Booze, MD;  Location: [place];  Service: Cardiovascular;  Laterality: N/A;    There were no vitals filed for this visit.   Subjective Assessment - 06/04/18 1638    Subjective  Patient reports she is here  today to be seen by her medical team for her newly diagnosed right breast cancer.    Patient is accompained by:  Family member    Pertinent History  Patient was diagnosed on 05/27/2018 with right triple negative grade III invasive ductal carcinoma breast cancer. It measures 8 mm and is located in the upper oute quadrant with a Ki67 of 20%. She had a stroke in 2014 but reports full recovery from that.    Patient Stated Goals  reduce lymphedema risk and learn post op shoulder ROM HEP    Currently in Pain?  No/denies         [facility] PT Assessment - 06/04/18 0001      Assessment   Medical Diagnosis  Right breast cancer    Referring Provider (PT)  Dr. Excell Seltzer    Onset Date/Surgical Date  05/27/18    Hand Dominance  Left    Prior Therapy  None      Precautions   Precautions  Other (comment)    Precaution Comments  active cancer      Restrictions   Weight Bearing Restrictions  No      Balance Screen   Has the patient fallen in the past 6 months  No    Has the patient had a decrease in activity level because of a fear of falling?   No  Is the patient reluctant to leave their home because of a fear of falling?   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[employer]  She walks 3 miles 5x/week when the weather is nice      Cognition   Overall Cognitive Status  Within Functional Limits for tasks assessed      Posture/Postural Control   Posture/Postural Control  No significant limitations      ROM / Strength   AROM / PROM / Strength  AROM;Strength      AROM   AROM Assessment Site  Shoulder;Cervical    Right/Left Shoulder  Right;Left    Right Shoulder Extension  52 Degrees    Right Shoulder Flexion  145 Degrees    Right  Shoulder ABduction  158 Degrees    Right Shoulder Internal Rotation  52 Degrees    Right Shoulder External Rotation  82 Degrees    Left Shoulder Extension  62 Degrees    Left Shoulder Flexion  142 Degrees    Left Shoulder ABduction  158 Degrees    Left Shoulder Internal Rotation  58 Degrees    Left Shoulder External Rotation  75 Degrees    Cervical Flexion  WNL    Cervical Extension  WNL    Cervical - Right Side Bend  WNL    Cervical - Left Side Bend  WNL    Cervical - Right Rotation  WNL    Cervical - Left Rotation  WNL      Strength   Overall Strength  Within functional limits for tasks performed        LYMPHEDEMA/ONCOLOGY QUESTIONNAIRE - 06/04/18 1700      Type   Cancer Type  Right breast cancer      Lymphedema Assessments   Lymphedema Assessments  Upper extremities      Right Upper Extremity Lymphedema   10 cm Proximal to Olecranon Process  26.3 cm    Olecranon Process  23 cm    10 cm Proximal to Ulnar Styloid Process  20.2 cm    Just Proximal to Ulnar Styloid Process  14.7 cm    Across Hand at [employer]  18.3 cm    At [place] of 2nd Digit  6 cm      Left Upper Extremity Lymphedema   10 cm Proximal to Olecranon Process  25.9 cm    Olecranon Process  22.9 cm    10 cm Proximal to Ulnar Styloid Process  20.8 cm    Just Proximal to Ulnar Styloid Process  15.1 cm    Across Hand at [employer]  18.5 cm    At [place] of 2nd Digit  6.1 cm          Quick Dash - 06/04/18 0001    Open a tight or new jar  No difficulty    Do heavy household chores (wash walls, wash floors)  No difficulty    Carry a shopping bag or briefcase  No difficulty    Wash your back  No difficulty    Use a knife to cut food  No difficulty    Recreational activities  in which you take some force or impact through your arm, shoulder, or hand (golf, hammering, tennis)  No difficulty    During the past week, to what extent has your arm, shoulder or hand problem interfered with your normal social  activities with family, friends, neighbors, or groups?  Not at all    During the past week, to what extent has your arm, shoulder or hand problem limited your work or other regular daily activities  Not at all    Arm, shoulder, or hand pain.  None    Tingling (pins and needles) in your arm, shoulder, or hand  None    Difficulty Sleeping  No difficulty    DASH Score  0 %        Objective measurements completed on examination: See above findings.     Patient was instructed today in a home exercise program today for post op shoulder range of motion. These included active assist shoulder flexion in sitting, scapular retraction, wall walking with shoulder abduction, and hands behind head external rotation.  She was encouraged to do these twice a day, holding 3 seconds and repeating 5 times when permitted by her physician.       PT Education - 06/04/18 1701    Education Details  Lymphedema risk reduction and post op shoulder ROM HEP    Person(s) Educated  Patient;Child(ren)    Methods  Explanation;Demonstration;Handout    Comprehension  Returned demonstration;Verbalized understanding           Breast Clinic Goals - 06/04/18 1705      Patient will be able to verbalize understanding of pertinent lymphedema risk reduction practices relevant to her diagnosis specifically related to skin care.   Time  1    Period  Days    Status  Achieved      Patient will be able to return demonstrate and/or verbalize understanding of the post-op home exercise program related to regaining shoulder range of motion.   Time  1    Period  Days    Status  Achieved      Patient will be able to verbalize understanding of the importance of attending the postoperative After Breast Cancer Class for further lymphedema risk reduction education and therapeutic exercise.   Time  1    Period  Days    Status  Achieved            Plan - 06/04/18 1703    Clinical Impression Statement  Patient was  diagnosed on 05/27/2018 with right triple negative grade III invasive ductal carcinoma breast cancer. It measures 8 mm and is located in the upper oute quadrant with a Ki67 of 20%. She had a stroke in 2014 but reports full recovery from that. Her multidisciplinary medical team met prior ot her assessments to determine a recommended treatment plan. She is planning to have neoadjuvant chemotherapy followed by a right lumpectomy and sentinel node biopsy, radiation, and anti-estrogen therapy. She will benefit from post op PT to regain shoulder ROM and reduce lymphedema risk.     History and Personal Factors relevant to plan of care:  Lives alone; previous CVA    Clinical Presentation  Stable    Clinical Decision Making  Low    Rehab Potential  Excellent    Clinical Impairments Affecting Rehab Potential  None    PT Frequency  One time visit    PT Treatment/Interventions  ADLs/Self Care Home Management;Patient/family education;Therapeutic exercise    PT Next Visit Plan  Will reassess post operatively if MD refers    PT Home Exercise Plan  Post op shoulder ROM HEP    Consulted and Agree with Plan of Care  Patient;Family member/caregiver    Family Member Consulted  Daughter       Patient will benefit from skilled therapeutic intervention in order to improve the following deficits and impairments:  Decreased range of motion, Impaired UE functional use, Pain, Decreased knowledge of precautions, Postural dysfunction  Visit Diagnosis: Malignant neoplasm of upper-outer quadrant of right breast in female, estrogen receptor negative ([place]) - Plan: PT plan of care cert/re-cert   Patient will follow up at outpatient cancer rehab 3-4 weeks following surgery.  If the patient requires physical therapy at that time, a specific plan will be dictated and sent to the referring physician for approval. The patient was educated today on appropriate basic range of motion exercises to begin post operatively and the importance  of attending the After Breast Cancer class following surgery.  Patient was educated today on lymphedema risk reduction practices as it pertains to recommendations that will benefit the patient immediately following surgery.  She verbalized good understanding.      Problem List Patient Active Problem List   Diagnosis Date Noted  . Malignant neoplasm of upper-outer quadrant of right breast in female, estrogen receptor negative ([place]) 06/02/2018  . Insomnia 06/03/2014  . Migraine, unspecified, without mention of intractable migraine without mention of status migrainosus 12/25/2012  . 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 Hypertension 12/22/2012  . S/P abdominal hysterectomy 04/17/2011   Annia Friendly, PT 06/04/18 5:09 PM  [place], [place], [address] Phone: [phone]   Fax:  [phone]  Name: Katrina Liu MRN: [id number] Date of Birth: 08/16/1960

## 2018-06-04 NOTE — Addendum Note (Signed)
Addended by: Nicholas Lose on: 06/04/2018 04:00 PM   Modules accepted: Orders

## 2018-06-05 ENCOUNTER — Telehealth: Payer: Self-pay | Admitting: Hematology and Oncology

## 2018-06-05 NOTE — Telephone Encounter (Signed)
No los °

## 2018-06-06 ENCOUNTER — Telehealth: Payer: Self-pay | Admitting: *Deleted

## 2018-06-06 NOTE — Telephone Encounter (Signed)
Pt called concerning pre-auth for breast MRI. Was told by Christella Scheuermann was authorized at [facility]. Pt is scheduled at [facility]. This RN called [employer] to change location of the facility. Was informed by Christella Scheuermann that the pt had to call and change the facility. Called pt back, gave number to [facility] to call and change facility.  Pt denies questions or concerns regarding dx or treatment care plan. Encourage pt to call with needs. Received verbal understanding.

## 2018-06-09 ENCOUNTER — Telehealth: Payer: Self-pay | Admitting: Medical Oncology

## 2018-06-09 ENCOUNTER — Ambulatory Visit
Admission: RE | Admit: 2018-06-09 | Discharge: 2018-06-09 | Disposition: A | Discharge status: Home / Self Care | Payer: Managed Care, Other (non HMO) | Source: Ambulatory Visit | Attending: Hematology and Oncology | Admitting: Hematology and Oncology

## 2018-06-09 DIAGNOSIS — Z171 Estrogen receptor negative status [ER-]: Principal | ICD-10-CM

## 2018-06-09 DIAGNOSIS — C50411 Malignant neoplasm of upper-outer quadrant of right female breast: Secondary | ICD-10-CM

## 2018-06-09 MED ORDER — GADOBUTROL 1 MMOL/ML IV SOLN
6.0000 mL | Freq: Once | INTRAVENOUS | Status: AC | PRN
  Administered 2018-06-09: 6 mL via INTRAVENOUS

## 2018-06-09 NOTE — Telephone Encounter (Signed)
UPBEAT Follow up call to patient regarding study and to see if she had any questions. Patient states she did not have any questions about the study and states that she feels "too overwhelmed" and does not wish to participate. I thanked patient for her time and consideration of the study. Patient was encouraged to call Dr. Lindi Adie with any other questions or concerns she may have.  Maxwell Marion, RN, BSN, [facility] Clinical Research 06/09/2018 4:04 PM

## 2018-06-10 ENCOUNTER — Encounter: Payer: Self-pay | Admitting: Hematology and Oncology

## 2018-06-10 ENCOUNTER — Ambulatory Visit (HOSPITAL_COMMUNITY)
Admission: RE | Admit: 2018-06-10 | Discharge: 2018-06-10 | Disposition: A | Discharge status: Home / Self Care | Payer: Managed Care, Other (non HMO) | Source: Ambulatory Visit | Attending: Hematology and Oncology | Admitting: Hematology and Oncology

## 2018-06-10 ENCOUNTER — Inpatient Hospital Stay: Payer: Managed Care, Other (non HMO)

## 2018-06-10 DIAGNOSIS — C50411 Malignant neoplasm of upper-outer quadrant of right female breast: Secondary | ICD-10-CM | POA: Insufficient documentation

## 2018-06-10 DIAGNOSIS — Z171 Estrogen receptor negative status [ER-]: Secondary | ICD-10-CM | POA: Insufficient documentation

## 2018-06-10 DIAGNOSIS — Z8673 Personal history of transient ischemic attack (TIA), and cerebral infarction without residual deficits: Secondary | ICD-10-CM | POA: Insufficient documentation

## 2018-06-10 DIAGNOSIS — I10 Essential (primary) hypertension: Secondary | ICD-10-CM | POA: Insufficient documentation

## 2018-06-10 DIAGNOSIS — K219 Gastro-esophageal reflux disease without esophagitis: Secondary | ICD-10-CM | POA: Insufficient documentation

## 2018-06-10 NOTE — Progress Notes (Signed)
  Echocardiogram 2D Echocardiogram has been performed.  Darlina Sicilian M 06/10/2018, 10:41 AM

## 2018-06-10 NOTE — Progress Notes (Signed)
Met with patient after chemo ed to introduce myself as Arboriculturist and to offer available resources.  Discussed one-time $1000 Radio broadcast assistant to assist with personal expenses while going through treatment. Based on income guidelines, patient states she does not qualify.  Discussed copay assistance through [employer] complete for Udnenyca. Patient gave me permission to enroll her.  Patient had questions regarding billing and payment arrangements. Advised her to contact the number on the bills to discuss arrangements when she receives them. Gave her information on [employer] and the hardship settlement to discuss with billing if she is interested in those programs. She was very Patent attorney.  She has my contact name and number for any additional financial questions or concerns.

## 2018-06-10 NOTE — Progress Notes (Signed)
Patient Care Team: [name], MD as PCP - General (Family Medicine) Excell Seltzer, MD as Consulting Physician (General Surgery) Nicholas Lose, MD as Consulting Physician (Hematology and Oncology) Kyung Rudd, MD as Consulting Physician (Radiation Oncology)  DIAGNOSIS:    ICD-10-CM   1. Malignant neoplasm of upper-outer quadrant of right breast in female, estrogen receptor negative ([place]) C50.411    Z17.1     SUMMARY OF ONCOLOGIC HISTORY:   Malignant neoplasm of upper-outer quadrant of right breast in female, estrogen receptor negative ([place])   05/29/2018 Initial Diagnosis    Right axillary pain which led to a mammogram and ultrasound that revealed a new mass in the superior UOQ tail of the breast 10 o'clock position by ultrasound measured 8 mm, axilla negative, biopsy revealed grade 3 IDC triple negative with a Ki-67 of 20%, T1BN0 stage Ib clinical stage    06/04/2018 Cancer Staging    Staging form: Breast, AJCC 8th Edition - Clinical stage from 06/04/2018: Stage IB (cT1b, cN0, cM0, G3, ER-, PR-, HER2-) - Signed by Nicholas Lose, MD on 06/04/2018    06/17/2018 -  Chemotherapy    The patient had DOXOrubicin (ADRIAMYCIN) chemo injection 102 mg, 60 mg/m2 = 102 mg, Intravenous,  Once, 0 of 4 cycles palonosetron (ALOXI) injection 0.25 mg, 0.25 mg, Intravenous,  Once, 0 of 8 cycles pegfilgrastim (NEULASTA ONPRO KIT) injection 6 mg, 6 mg, Subcutaneous, Once, 0 of 4 cycles CARBOplatin (PARAPLATIN) 530 mg in sodium chloride 0.9 % 250 mL chemo infusion, 530 mg (100 % of original dose 532.8 mg), Intravenous,  Once, 0 of 4 cycles Dose modification:   (original dose 532.8 mg, Cycle 5) cyclophosphamide (CYTOXAN) 1,020 mg in sodium chloride 0.9 % 250 mL chemo infusion, 600 mg/m2 = 1,020 mg, Intravenous,  Once, 0 of 4 cycles PACLitaxel (TAXOL) 138 mg in sodium chloride 0.9 % 250 mL chemo infusion (</= 69m/m2), 80 mg/m2 = 138 mg, Intravenous,  Once, 0 of 4 cycles fosaprepitant (EMEND) 150 mg,  dexamethasone (DECADRON) 12 mg in sodium chloride 0.9 % 145 mL IVPB, , Intravenous,  Once, 0 of 8 cycles  for chemotherapy treatment.      CHIEF COMPLIANT: Cycle 1 Day 1 Adriamycin and Cytoxan  INTERVAL HISTORY: Katrina RONNINGis a 58y.o. with above-mentioned history of triple negative right breast cancer. She presents to the clinic today to begin neoadjuvant chemotherapy with dose dense Adriamycin and Cytoxan. Her ECHO on 06/10/18 showed an ejection fraction in the range of 65-70%. A breast MRI from 06/09/18 showed the known 1.1cm mass in the right breast. She presents to the clinic today with her friend. She did not take her blood pressure medication this morning and her bp at the clinic was 156/83. She slept well and is motivated and ready for treatment. She was concerned about altering her diet during treatment.  REVIEW OF SYSTEMS:   Constitutional: Denies fevers, chills or abnormal weight loss Eyes: Denies blurriness of vision Ears, nose, mouth, throat, and face: Denies mucositis or sore throat Respiratory: Denies cough, dyspnea or wheezes Cardiovascular: Denies palpitation, chest discomfort Gastrointestinal: Denies nausea, heartburn or change in bowel habits Skin: Denies abnormal skin rashes Lymphatics: Denies new lymphadenopathy or easy bruising Neurological: Denies numbness, tingling or new weaknesses Behavioral/Psych: Mood is stable, no new changes  Extremities: No lower extremity edema Breast: denies any pain or lumps or nodules in either breasts All other systems were reviewed with the patient and are negative.  I have reviewed the past medical history, past  surgical history, social history and family history with the patient and they are unchanged from previous note.  ALLERGIES:  is allergic to lisinopril.  MEDICATIONS:  Current Outpatient Medications  Medication Sig Dispense Refill  . Ascorbic Acid (VITAMIN C) 500 MG CAPS Take 500 mg by mouth daily.     [name] aspirin EC 81 MG  EC tablet Take 1 tablet (81 mg total) by mouth daily. 90 tablet 0  . Biotin 10000 MCG TABS Take 10,000 mcg by mouth daily.    . Calcium Carbonate-Vitamin D (CALCIUM 500/D PO) Take by mouth.    . chlorthalidone (HYGROTON) 25 MG tablet Take 1 tablet (25 mg total) by mouth daily. (Patient not taking: Reported on 06/04/2018) 90 tablet 2  . Cyanocobalamin (VITAMIN B 12 PO) Take by mouth.    . dexamethasone (DECADRON) 4 MG tablet Take 1 tablet (4 mg total) by mouth daily. Take 1 tablet day after chemo and 1 tablet 2 days after chemo with food 8 tablet 0  . Flaxseed, Linseed, (FLAX SEED OIL) 1000 MG CAPS Take 1,000 mg by mouth once a week.    . hydrochlorothiazide (HYDRODIURIL) 25 MG tablet Take 25 mg by mouth daily.    [name] HYDROcodone-Acetaminophen (LORTAB) 10-300 MG/15ML SOLN Take 5 mLs by mouth every 6 (six) hours as needed. Do not drive, work, or operate machinery while taking this medication (Patient not taking: Reported on 06/04/2018) 100 mL 0  . labetalol (NORMODYNE) 100 MG tablet Take 1 tablet (100 mg total) by mouth 2 (two) times daily. 180 tablet 3  . lidocaine-prilocaine (EMLA) cream Apply to affected area once 30 g 3  . LORazepam (ATIVAN) 0.5 MG tablet Take 1 tablet (0.5 mg total) by mouth at bedtime as needed (Nausea or vomiting). 30 tablet 0  . mometasone (ELOCON) 0.1 % ointment Apply 1 application topically as needed (dry skin).     . Multiple Vitamin (MULTIVITAMIN) tablet Take 1 tablet by mouth daily.    . ondansetron (ZOFRAN) 8 MG tablet Take 1 tablet (8 mg total) by mouth 2 (two) times daily as needed. Start on the third day after chemotherapy. 30 tablet 1  . prochlorperazine (COMPAZINE) 10 MG tablet Take 1 tablet (10 mg total) by mouth every 6 (six) hours as needed (Nausea or vomiting). 30 tablet 1  . Turmeric 500 MG TABS Take 500 mg by mouth daily.    [name] zinc gluconate 50 MG tablet Take 50 mg by mouth daily.     No current facility-administered medications for this visit.     Facility-Administered Medications Ordered in Other Visits  Medication Dose Route Frequency Provider Last Rate Last Dose  . sodium chloride flush (NS) 0.9 % injection 10 mL  10 mL Intravenous PRN Nicholas Lose, MD   10 mL at 06/17/18 0827    PHYSICAL EXAMINATION: ECOG PERFORMANCE STATUS: 1 - Symptomatic but completely ambulatory  Vitals:   06/17/18 0852  BP: (!) 156/83  Pulse: 72  Resp: 18  Temp: 97.8 F (36.6 C)  SpO2: 100%   Filed Weights   06/17/18 0852  Weight: 141 lb (64 kg)    GENERAL: alert, no distress and comfortable SKIN: skin color, texture, turgor are normal, no rashes or significant lesions EYES: normal, Conjunctiva are pink and non-injected, sclera clear OROPHARYNX: no exudate, no erythema and lips, buccal mucosa, and tongue normal  NECK: supple, thyroid normal size, non-tender, without nodularity LYMPH: no palpable lymphadenopathy in the cervical, axillary or inguinal LUNGS: clear to auscultation and percussion with  normal breathing effort HEART: regular rate & rhythm and no murmurs and no lower extremity edema ABDOMEN: abdomen soft, non-tender and normal bowel sounds MUSCULOSKELETAL: no cyanosis of digits and no clubbing  NEURO: alert & oriented x 3 with fluent speech, no focal motor/sensory deficits EXTREMITIES: No lower extremity edema  LABORATORY DATA:  I have reviewed the data as listed CMP Latest Ref Rng & Units 06/04/2018 12/22/2012 04/17/2011  Glucose 70 - 99 mg/dL 103(H) 99 125(H)  BUN 6 - 20 mg/dL _0 Creatinine 0.44 - 1.00 mg/dL 0.95 0.80 0.78  Sodium 135 - 145 mmol/L 141 141 133(L)  Potassium 3.5 - 5.1 mmol/L 3.2(L) 3.5 3.8  Chloride 98 - 111 mmol/L 101 101 103  CO2 22 - 32 mmol/L _1 Calcium 8.9 - 10.3 mg/dL 10.1 10.0 8.3(L)  Total Protein 6.5 - 8.1 g/dL 7.0 7.3 -  Total Bilirubin 0.3 - 1.2 mg/dL 0.8 0.5 -  Alkaline Phos 38 - 126 U/L 65 61 -  AST 15 - 41 U/L 16 15 -  ALT 0 - 44 U/L 12 11 -    Lab Results  Component Value  Date   WBC 3.7 (L) 06/17/2018   HGB 12.9 06/17/2018   HCT 37.6 06/17/2018   MCV 88.3 06/17/2018   PLT 249 06/17/2018   NEUTROABS 1.8 06/17/2018    ASSESSMENT & PLAN:  Malignant neoplasm of upper-outer quadrant of right breast in female, estrogen receptor negative ([facility]) 05/29/2018:Right axillary pain which led to a mammogram and ultrasound that revealed a new mass in the superior UOQ tail of the breast 10 o'clock position by ultrasound measured 8 mm, axilla negative, 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 Neoadjuvant chemotherapy with Adriamycin and Cytoxan dose dense 4 followed by Taxol weekly 12 and carboplatin every 3 weeks 2. Followed by breast conserving surgery with sentinel lymph node study  3. Followed by adjuvant radiation therapy Genetics consultation is happening this week ---------------------------------------------------------------------------------------------------------------------------------- Current treatment: Cycle 1 day 1 dose dense Adriamycin and Cytoxan Labs reviewed Echocardiogram 06/10/2018: EF greater than 65% Breast MRI 06/09/2018: 1.1 cm enhancing mass UOQ right breast Return to clinic in 1 week for toxicity check   No orders of the defined types were placed in this encounter.  The patient has a good understanding of the overall plan. she agrees with it. she will call with any problems that may develop before the next visit here.  Nicholas Lose, MD 06/17/2018  Julious Oka Dorshimer am acting as scribe for Dr. Nicholas Lose.  I have reviewed the above documentation for accuracy and completeness, and I agree with the above.

## 2018-06-10 NOTE — Progress Notes (Signed)
Enrolled patient in [employer] for Udenyca copay assistance.  Patient approved for up to $15,000 maximum over the next 12 months leaving her with a $0 copay after insurance pays.  A copy of the approval letter will be mailed to the patient for her records.  A copy given to [facility] for billing/copay monitoring.

## 2018-06-11 ENCOUNTER — Encounter: Payer: Self-pay | Admitting: Hematology and Oncology

## 2018-06-11 ENCOUNTER — Other Ambulatory Visit: Payer: Self-pay | Admitting: Hematology and Oncology

## 2018-06-11 NOTE — Progress Notes (Signed)
Patient returned my call. Advised her of the change from [place] to Neulasta per insurance guidelines and the difference in the copay assistance. Patient verbalized understanding and is in agreement with whatever the doctor decides.  She has my name and number for any additional financial questions or concerns.

## 2018-06-11 NOTE — Progress Notes (Signed)
Received staff message from [place] w/authorizations stating insurance requires patient try Neulasta before Udenyca.  Enrolled patient in [place] program for Neulasta. Patient approved for maximum benefit of $10,000 with a $0 copay for the first dose after insurance pays and each additional dose would be a $5 copay after insurance pays.   Called patient to notify of the change. Left a voicemail with my contact name and number.

## 2018-06-17 ENCOUNTER — Inpatient Hospital Stay (HOSPITAL_BASED_OUTPATIENT_CLINIC_OR_DEPARTMENT_OTHER): Payer: Managed Care, Other (non HMO) | Admitting: Hematology and Oncology

## 2018-06-17 ENCOUNTER — Encounter: Payer: Self-pay | Admitting: *Deleted

## 2018-06-17 ENCOUNTER — Inpatient Hospital Stay: Payer: Managed Care, Other (non HMO)

## 2018-06-17 DIAGNOSIS — Z171 Estrogen receptor negative status [ER-]: Principal | ICD-10-CM

## 2018-06-17 DIAGNOSIS — C50411 Malignant neoplasm of upper-outer quadrant of right female breast: Secondary | ICD-10-CM | POA: Diagnosis not present

## 2018-06-17 DIAGNOSIS — Z95828 Presence of other vascular implants and grafts: Secondary | ICD-10-CM

## 2018-06-17 LAB — CMP (CANCER CENTER ONLY)
ALT: 16 U/L (ref 0–44)
ANION GAP: 10 (ref 5–15)
AST: 20 U/L (ref 15–41)
Albumin: 4 g/dL (ref 3.5–5.0)
Alkaline Phosphatase: 76 U/L (ref 38–126)
BUN: 16 mg/dL (ref 6–20)
CO2: 28 mmol/L (ref 22–32)
Calcium: 9.6 mg/dL (ref 8.9–10.3)
Chloride: 103 mmol/L (ref 98–111)
Creatinine: 0.99 mg/dL (ref 0.44–1.00)
GFR, Est AFR Am: >60 mL/min (ref >60)
GFR, Estimated: >60 mL/min (ref >60)
Glucose, Bld: 93 mg/dL (ref 70–99)
Potassium: 3.6 mmol/L (ref 3.5–5.1)
Sodium: 141 mmol/L (ref 135–145)
Total Bilirubin: 0.3 mg/dL (ref 0.3–1.2)
Total Protein: 7 g/dL (ref 6.5–8.1)

## 2018-06-17 LAB — CBC WITH DIFFERENTIAL (CANCER CENTER ONLY)
Abs Immature Granulocytes: 0 10*3/uL (ref 0.00–0.07)
BASOS ABS: 0 10*3/uL (ref 0.0–0.1)
Basophils Relative: 1 %
Eosinophils Absolute: 0.1 10*3/uL (ref 0.0–0.5)
Eosinophils Relative: 2 %
HCT: 37.6 % (ref 36.0–46.0)
Hemoglobin: 12.9 g/dL (ref 12.0–15.0)
Immature Granulocytes: 0 %
Lymphocytes Relative: 38 %
Lymphs Abs: 1.4 10*3/uL (ref 0.7–4.0)
MCH: 30.3 pg (ref 26.0–34.0)
MCHC: 34.3 g/dL (ref 30.0–36.0)
MCV: 88.3 fL (ref 80.0–100.0)
Monocytes Absolute: 0.3 10*3/uL (ref 0.1–1.0)
Monocytes Relative: 9 %
Neutro Abs: 1.8 10*3/uL (ref 1.7–7.7)
Neutrophils Relative %: 50 %
PLATELETS: 249 10*3/uL (ref 150–400)
RBC: 4.26 MIL/uL (ref 3.87–5.11)
RDW: 12.6 % (ref 11.5–15.5)
WBC: 3.7 10*3/uL — AB (ref 4.0–10.5)
nRBC: 0 % (ref 0.0–0.2)

## 2018-06-17 MED ORDER — SODIUM CHLORIDE 0.9 % IV SOLN
Freq: Once | INTRAVENOUS | Status: AC
  Administered 2018-06-17: 10:00:00 via INTRAVENOUS
  Filled 2018-06-17: qty 5

## 2018-06-17 MED ORDER — PALONOSETRON HCL INJECTION 0.25 MG/5ML
0.2500 mg | Freq: Once | INTRAVENOUS | Status: AC
  Administered 2018-06-17: 0.25 mg via INTRAVENOUS

## 2018-06-17 MED ORDER — SODIUM CHLORIDE 0.9% FLUSH
10.0000 mL | INTRAVENOUS | Status: DC | PRN
  Administered 2018-06-17: 10 mL
  Filled 2018-06-17: qty 10

## 2018-06-17 MED ORDER — PEGFILGRASTIM 6 MG/0.6ML ~~LOC~~ PSKT
6.0000 mg | PREFILLED_SYRINGE | Freq: Once | SUBCUTANEOUS | Status: AC
  Administered 2018-06-17: 6 mg via SUBCUTANEOUS

## 2018-06-17 MED ORDER — SODIUM CHLORIDE 0.9% FLUSH
10.0000 mL | INTRAVENOUS | Status: DC | PRN
  Administered 2018-06-17: 10 mL via INTRAVENOUS
  Filled 2018-06-17: qty 10

## 2018-06-17 MED ORDER — PALONOSETRON HCL INJECTION 0.25 MG/5ML
INTRAVENOUS | Status: AC
  Filled 2018-06-17: qty 5

## 2018-06-17 MED ORDER — SODIUM CHLORIDE 0.9 % IV SOLN
600.0000 mg/m2 | Freq: Once | INTRAVENOUS | Status: AC
  Administered 2018-06-17: 1020 mg via INTRAVENOUS
  Filled 2018-06-17: qty 51

## 2018-06-17 MED ORDER — SODIUM CHLORIDE 0.9 % IV SOLN
Freq: Once | INTRAVENOUS | Status: AC
  Administered 2018-06-17: 10:00:00 via INTRAVENOUS
  Filled 2018-06-17: qty 250

## 2018-06-17 MED ORDER — HEPARIN SOD (PORK) LOCK FLUSH 100 UNIT/ML IV SOLN
500.0000 [IU] | Freq: Once | INTRAVENOUS | Status: AC | PRN
  Administered 2018-06-17: 500 [IU]
  Filled 2018-06-17: qty 5

## 2018-06-17 MED ORDER — PEGFILGRASTIM 6 MG/0.6ML ~~LOC~~ PSKT
PREFILLED_SYRINGE | SUBCUTANEOUS | Status: AC
  Filled 2018-06-17: qty 0.6

## 2018-06-17 MED ORDER — DOXORUBICIN HCL CHEMO IV INJECTION 2 MG/ML
60.0000 mg/m2 | Freq: Once | INTRAVENOUS | Status: AC
  Administered 2018-06-17: 102 mg via INTRAVENOUS
  Filled 2018-06-17: qty 51

## 2018-06-17 NOTE — Progress Notes (Signed)
Notified by pharmacy that pt's OTC Tumeric can increase side effects from chemo. Asked pt if she had taken any today, and she had not. Updated her on message from pharmacy and pt stated she will stop taking it while undergoing treatment. Pharmacy updated.

## 2018-06-17 NOTE — Patient Instructions (Signed)
[facility] Discharge Instructions for Patients Receiving Chemotherapy  Today you received the following chemotherapy agents: Doxorubicin (Adrimamycin) and Cyclophosphamide (Cytoxan)  To help prevent nausea and vomiting after your treatment, we encourage you to take your nausea medication as directed. Received Aloxi during your treatment today-->Take your Compazine or Ativan prescription (not your Zofran) for the next 3 days as needed.   If you develop nausea and vomiting that is not controlled by your nausea medication, call the clinic.   BELOW ARE SYMPTOMS THAT SHOULD BE REPORTED IMMEDIATELY:  *FEVER GREATER THAN 100.5 F  *CHILLS WITH OR WITHOUT FEVER  NAUSEA AND VOMITING THAT IS NOT CONTROLLED WITH YOUR NAUSEA MEDICATION  *UNUSUAL SHORTNESS OF BREATH  *UNUSUAL BRUISING OR BLEEDING  TENDERNESS IN MOUTH AND THROAT WITH OR WITHOUT PRESENCE OF ULCERS  *URINARY PROBLEMS  *BOWEL PROBLEMS  UNUSUAL RASH Items with * indicate a potential emergency and should be followed up as soon as possible.  Feel free to call the clinic should you have any questions or concerns. The clinic phone number is [phone].  Please show the [facility] at check-in to the Emergency Department and triage nurse.  Doxorubicin injection What is this medicine? DOXORUBICIN (dox oh ROO bi sin) is a chemotherapy drug. It is used to treat many kinds of cancer like leukemia, lymphoma, neuroblastoma, sarcoma, and Wilms' tumor. It is also used to treat bladder cancer, breast cancer, lung cancer, ovarian cancer, stomach cancer, and thyroid cancer. This medicine may be used for other purposes; ask your health care provider or pharmacist if you have questions. COMMON BRAND NAME(S): Adriamycin, Adriamycin PFS, Adriamycin RDF, Rubex What should I tell my health care provider before I take this medicine? They need to know if you have any of these conditions: -heart disease -history of low blood  counts caused by a medicine -liver disease -recent or ongoing radiation therapy -an unusual or allergic reaction to doxorubicin, other chemotherapy agents, other medicines, foods, dyes, or preservatives -pregnant or trying to get pregnant -breast-feeding How should I use this medicine? This drug is given as an infusion into a vein. It is administered in a hospital or clinic by a specially trained health care professional. If you have pain, swelling, burning or any unusual feeling around the site of your injection, tell your health care professional right away. Talk to your pediatrician regarding the use of this medicine in children. Special care may be needed. Overdosage: If you think you have taken too much of this medicine contact a poison control center or emergency room at once. NOTE: This medicine is only for you. Do not share this medicine with others. What if I miss a dose? It is important not to miss your dose. Call your doctor or health care professional if you are unable to keep an appointment. What may interact with this medicine? This medicine may interact with the following medications: -6-mercaptopurine -paclitaxel -phenytoin -St. John's Wort -trastuzumab -verapamil This list may not describe all possible interactions. Give your health care provider a list of all the medicines, herbs, non-prescription drugs, or dietary supplements you use. Also tell them if you smoke, drink alcohol, or use illegal drugs. Some items may interact with your medicine. What should I watch for while using this medicine? This drug may make you feel generally unwell. This is not uncommon, as chemotherapy can affect healthy cells as well as cancer cells. Report any side effects. Continue your course of treatment even though you feel ill unless your  doctor tells you to stop. There is a maximum amount of this medicine you should receive throughout your life. The amount depends on the medical condition  being treated and your overall health. Your doctor will watch how much of this medicine you receive in your lifetime. Tell your doctor if you have taken this medicine before. You may need blood work done while you are taking this medicine. Your urine may turn red for a few days after your dose. This is not blood. If your urine is dark or brown, call your doctor. In some cases, you may be given additional medicines to help with side effects. Follow all directions for their use. Call your doctor or health care professional for advice if you get a fever, chills or sore throat, or other symptoms of a cold or flu. Do not treat yourself. This drug decreases your body's ability to fight infections. Try to avoid being around people who are sick. This medicine may increase your risk to bruise or bleed. Call your doctor or health care professional if you notice any unusual bleeding. Talk to your doctor about your risk of cancer. You may be more at risk for certain types of cancers if you take this medicine. Do not become pregnant while taking this medicine or for 6 months after stopping it. Women should inform their doctor if they wish to become pregnant or think they might be pregnant. Men should not father a child while taking this medicine and for 6 months after stopping it. There is a potential for serious side effects to an unborn child. Talk to your health care professional or pharmacist for more information. Do not breast-feed an infant while taking this medicine. This medicine has caused ovarian failure in some women and reduced sperm counts in some men This medicine may interfere with the ability to have a child. Talk with your doctor or health care professional if you are concerned about your fertility. This medicine may cause a decrease in Co-Enzyme Q-10. You should make sure that you get enough Co-Enzyme Q-10 while you are taking this medicine. Discuss the foods you eat and the vitamins you take with your  health care professional. What side effects may I notice from receiving this medicine? Side effects that you should report to your doctor or health care professional as soon as possible: -allergic reactions like skin rash, itching or hives, swelling of the face, lips, or tongue -breathing problems -chest pain -fast or irregular heartbeat -low blood counts - this medicine may decrease the number of white blood cells, red blood cells and platelets. You may be at increased risk for infections and bleeding. -pain, redness, or irritation at site where injected -signs of infection - fever or chills, cough, sore throat, pain or difficulty passing urine -signs of decreased platelets or bleeding - bruising, pinpoint red spots on the skin, black, tarry stools, blood in the urine -swelling of the ankles, feet, hands -tiredness -weakness Side effects that usually do not require medical attention (report to your doctor or health care professional if they continue or are bothersome): -diarrhea -hair loss -mouth sores -nail discoloration or damage -nausea -red colored urine -vomiting This list may not describe all possible side effects. Call your doctor for medical advice about side effects. You may report side effects to FDA at 1-800-FDA-1088. Where should I keep my medicine? This drug is given in a hospital or clinic and will not be stored at home. NOTE: This sheet is a summary. It may  not cover all possible information. If you have questions about this medicine, talk to your doctor, pharmacist, or health care provider.  2019 Elsevier/Gold Standard (2016-11-28 11:01:26)  Cyclophosphamide injection What is this medicine? CYCLOPHOSPHAMIDE (sye kloe FOSS fa mide) is a chemotherapy drug. It slows the growth of cancer cells. This medicine is used to treat many types of cancer like lymphoma, myeloma, leukemia, breast cancer, and ovarian cancer, to name a few. This medicine may be used for other  purposes; ask your health care provider or pharmacist if you have questions. COMMON BRAND NAME(S): Cytoxan, Neosar What should I tell my health care provider before I take this medicine? They need to know if you have any of these conditions: -blood disorders -history of other chemotherapy -infection -kidney disease -liver disease -recent or ongoing radiation therapy -tumors in the bone marrow -an unusual or allergic reaction to cyclophosphamide, other chemotherapy, other medicines, foods, dyes, or preservatives -pregnant or trying to get pregnant -breast-feeding How should I use this medicine? This drug is usually given as an injection into a vein or muscle or by infusion into a vein. It is administered in a hospital or clinic by a specially trained health care professional. Talk to your pediatrician regarding the use of this medicine in children. Special care may be needed. Overdosage: If you think you have taken too much of this medicine contact a poison control center or emergency room at once. NOTE: This medicine is only for you. Do not share this medicine with others. What if I miss a dose? It is important not to miss your dose. Call your doctor or health care professional if you are unable to keep an appointment. What may interact with this medicine? This medicine may interact with the following medications: -amiodarone -amphotericin B -azathioprine -certain antiviral medicines for HIV or AIDS such as protease inhibitors (e.g., indinavir, ritonavir) and zidovudine -certain blood pressure medications such as benazepril, captopril, enalapril, fosinopril, lisinopril, moexipril, monopril, perindopril, quinapril, ramipril, trandolapril -certain cancer medications such as anthracyclines (e.g., daunorubicin, doxorubicin), busulfan, cytarabine, paclitaxel, pentostatin, tamoxifen, trastuzumab -certain diuretics such as chlorothiazide, chlorthalidone, hydrochlorothiazide, indapamide,  metolazone -certain medicines that treat or prevent blood clots like warfarin -certain muscle relaxants such as succinylcholine -cyclosporine -etanercept -indomethacin -medicines to increase blood counts like filgrastim, pegfilgrastim, sargramostim -medicines used as general anesthesia -metronidazole -natalizumab This list may not describe all possible interactions. Give your health care provider a list of all the medicines, herbs, non-prescription drugs, or dietary supplements you use. Also tell them if you smoke, drink alcohol, or use illegal drugs. Some items may interact with your medicine. What should I watch for while using this medicine? Visit your doctor for checks on your progress. This drug may make you feel generally unwell. This is not uncommon, as chemotherapy can affect healthy cells as well as cancer cells. Report any side effects. Continue your course of treatment even though you feel ill unless your doctor tells you to stop. Drink water or other fluids as directed. Urinate often, even at night. In some cases, you may be given additional medicines to help with side effects. Follow all directions for their use. Call your doctor or health care professional for advice if you get a fever, chills or sore throat, or other symptoms of a cold or flu. Do not treat yourself. This drug decreases your body's ability to fight infections. Try to avoid being around people who are sick. This medicine may increase your risk to bruise or bleed. Call your doctor or  health care professional if you notice any unusual bleeding. Be careful brushing and flossing your teeth or using a toothpick because you may get an infection or bleed more easily. If you have any dental work done, tell your dentist you are receiving this medicine. You may get drowsy or dizzy. Do not drive, use machinery, or do anything that needs mental alertness until you know how this medicine affects you. Do not become pregnant while  taking this medicine or for 1 year after stopping it. Women should inform their doctor if they wish to become pregnant or think they might be pregnant. Men should not father a child while taking this medicine and for 4 months after stopping it. There is a potential for serious side effects to an unborn child. Talk to your health care professional or pharmacist for more information. Do not breast-feed an infant while taking this medicine. This medicine may interfere with the ability to have a child. This medicine has caused ovarian failure in some women. This medicine has caused reduced sperm counts in some men. You should talk with your doctor or health care professional if you are concerned about your fertility. If you are going to have surgery, tell your doctor or health care professional that you have taken this medicine. What side effects may I notice from receiving this medicine? Side effects that you should report to your doctor or health care professional as soon as possible: -allergic reactions like skin rash, itching or hives, swelling of the face, lips, or tongue -low blood counts - this medicine may decrease the number of white blood cells, red blood cells and platelets. You may be at increased risk for infections and bleeding. -signs of infection - fever or chills, cough, sore throat, pain or difficulty passing urine -signs of decreased platelets or bleeding - bruising, pinpoint red spots on the skin, black, tarry stools, blood in the urine -signs of decreased red blood cells - unusually weak or tired, fainting spells, lightheadedness -breathing problems -dark urine -dizziness -palpitations -swelling of the ankles, feet, hands -trouble passing urine or change in the amount of urine -weight gain -yellowing of the eyes or skin Side effects that usually do not require medical attention (report to your doctor or health care professional if they continue or are bothersome): -changes in nail  or skin color -hair loss -missed menstrual periods -mouth sores -nausea, vomiting This list may not describe all possible side effects. Call your doctor for medical advice about side effects. You may report side effects to FDA at 1-800-FDA-1088. Where should I keep my medicine? This drug is given in a hospital or clinic and will not be stored at home. NOTE: This sheet is a summary. It may not cover all possible information. If you have questions about this medicine, talk to your doctor, pharmacist, or health care provider.  2019 Elsevier/Gold Standard (2012-02-29 16:22:58)  Pegfilgrastim injection (OnPro Device) What is this medicine? PEGFILGRASTIM (PEG fil gra stim) is a long-acting granulocyte colony-stimulating factor that stimulates the growth of neutrophils, a type of white blood cell important in the body's fight against infection. It is used to reduce the incidence of fever and infection in patients with certain types of cancer who are receiving chemotherapy that affects the bone marrow, and to increase survival after being exposed to high doses of radiation. This medicine may be used for other purposes; ask your health care provider or pharmacist if you have questions. COMMON BRAND NAME(S): Domenic Moras, UDENYCA What should I  tell my health care provider before I take this medicine? They need to know if you have any of these conditions: -kidney disease -latex allergy -ongoing radiation therapy -sickle cell disease -skin reactions to acrylic adhesives (On-Body Injector only) -an unusual or allergic reaction to pegfilgrastim, filgrastim, other medicines, foods, dyes, or preservatives -pregnant or trying to get pregnant -breast-feeding How should I use this medicine? This medicine is for injection under the skin. If you get this medicine at home, you will be taught how to prepare and give the pre-filled syringe or how to use the On-body Injector. Refer to the patient  Instructions for Use for detailed instructions. Use exactly as directed. Tell your healthcare provider immediately if you suspect that the On-body Injector may not have performed as intended or if you suspect the use of the On-body Injector resulted in a missed or partial dose. It is important that you put your used needles and syringes in a special sharps container. Do not put them in a trash can. If you do not have a sharps container, call your pharmacist or healthcare provider to get one. Talk to your pediatrician regarding the use of this medicine in children. While this drug may be prescribed for selected conditions, precautions do apply. Overdosage: If you think you have taken too much of this medicine contact a poison control center or emergency room at once. NOTE: This medicine is only for you. Do not share this medicine with others. What if I miss a dose? It is important not to miss your dose. Call your doctor or health care professional if you miss your dose. If you miss a dose due to an On-body Injector failure or leakage, a new dose should be administered as soon as possible using a single prefilled syringe for manual use. What may interact with this medicine? Interactions have not been studied. Give your health care provider a list of all the medicines, herbs, non-prescription drugs, or dietary supplements you use. Also tell them if you smoke, drink alcohol, or use illegal drugs. Some items may interact with your medicine. This list may not describe all possible interactions. Give your health care provider a list of all the medicines, herbs, non-prescription drugs, or dietary supplements you use. Also tell them if you smoke, drink alcohol, or use illegal drugs. Some items may interact with your medicine. What should I watch for while using this medicine? You may need blood work done while you are taking this medicine. If you are going to need a MRI, CT scan, or other procedure, tell your  doctor that you are using this medicine (On-Body Injector only). What side effects may I notice from receiving this medicine? Side effects that you should report to your doctor or health care professional as soon as possible: -allergic reactions like skin rash, itching or hives, swelling of the face, lips, or tongue -back pain -dizziness -fever -pain, redness, or irritation at site where injected -pinpoint red spots on the skin -red or dark-brown urine -shortness of breath or breathing problems -stomach or side pain, or pain at the shoulder -swelling -tiredness -trouble passing urine or change in the amount of urine Side effects that usually do not require medical attention (report to your doctor or health care professional if they continue or are bothersome): -bone pain -muscle pain This list may not describe all possible side effects. Call your doctor for medical advice about side effects. You may report side effects to FDA at 1-800-FDA-1088. Where should I keep my  medicine? Keep out of the reach of children. If you are using this medicine at home, you will be instructed on how to store it. Throw away any unused medicine after the expiration date on the label. NOTE: This sheet is a summary. It may not cover all possible information. If you have questions about this medicine, talk to your doctor, pharmacist, or health care provider.  2019 Elsevier/Gold Standard (2017-07-22 16:57:08)

## 2018-06-17 NOTE — Assessment & Plan Note (Signed)
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 Neoadjuvant chemotherapy with Adriamycin and Cytoxan dose dense 4 followed by Taxol weekly 12 and carboplatin every 3 weeks 2. Followed by breast conserving surgery with sentinel lymph node study  3. Followed by adjuvant radiation therapy ---------------------------------------------------------------------------------------------------------------------------------- Current treatment: Cycle 1 day 1 dose dense Adriamycin and Cytoxan Labs reviewed Echocardiogram 06/10/2018: EF greater than 65% Breast MRI 06/09/2018: 1.1 cm enhancing mass UOQ right breast Return to clinic in 1 week for toxicity check

## 2018-06-18 ENCOUNTER — Telehealth: Payer: Self-pay

## 2018-06-18 NOTE — Telephone Encounter (Signed)
Called pt to follow up after first time A/C and onpro yesterday. Pt doing well today. She went to work and had felt good. Eating/drinking okay without any issues. Pt taking oral steroid, and lorazepam for nausea and sleep.   Pt Onpro working appropriately, and will start taking claritin daily for 1 week, to prevent bone pain. Aware that she can take tylenol as needed for breakthrough bone pain, if any.  Reviewed pt diet and how much fluids she needs to be taking in. Pt instructed to monitor for fevers, chills, and appetite. Pt verbalized understanding and has no further needs at this time.

## 2018-06-20 ENCOUNTER — Telehealth: Payer: Self-pay

## 2018-06-20 NOTE — Progress Notes (Signed)
Patient Care Team: Katrina Lass, MD as PCP - General (Family Medicine) Excell Seltzer, MD as Consulting Physician (General Surgery) Nicholas Lose, MD as Consulting Physician (Hematology and Oncology) Kyung Rudd, MD as Consulting Physician (Radiation Oncology)  DIAGNOSIS:    ICD-10-CM   1. Malignant neoplasm of upper-outer quadrant of right breast in female, estrogen receptor negative ([place]) C50.411    Z17.1     SUMMARY OF ONCOLOGIC HISTORY:   Malignant neoplasm of upper-outer quadrant of right breast in female, estrogen receptor negative ([place])   05/29/2018 Initial Diagnosis    Right axillary pain which led to a mammogram and ultrasound that revealed a new mass in the superior UOQ tail of the breast 10 o'clock position by ultrasound measured 8 mm, axilla negative, biopsy revealed grade 3 IDC triple negative with a Ki-67 of 20%, T1BN0 stage Ib clinical stage    06/04/2018 Cancer Staging    Staging form: Breast, AJCC 8th Edition - Clinical stage from 06/04/2018: Stage IB (cT1b, cN0, cM0, G3, ER-, PR-, HER2-) - Signed by Nicholas Lose, MD on 06/04/2018    06/17/2018 -  Chemotherapy    The patient had DOXOrubicin (ADRIAMYCIN) chemo injection 102 mg, 60 mg/m2 = 102 mg, Intravenous,  Once, 1 of 4 cycles Administration: 102 mg (06/17/2018) palonosetron (ALOXI) injection 0.25 mg, 0.25 mg, Intravenous,  Once, 1 of 8 cycles Administration: 0.25 mg (06/17/2018) pegfilgrastim (NEULASTA ONPRO KIT) injection 6 mg, 6 mg, Subcutaneous, Once, 1 of 4 cycles Administration: 6 mg (06/17/2018) CARBOplatin (PARAPLATIN) 530 mg in sodium chloride 0.9 % 250 mL chemo infusion, 530 mg (100 % of original dose 532.8 mg), Intravenous,  Once, 0 of 4 cycles Dose modification:   (original dose 532.8 mg, Cycle 5) cyclophosphamide (CYTOXAN) 1,020 mg in sodium chloride 0.9 % 250 mL chemo infusion, 600 mg/m2 = 1,020 mg, Intravenous,  Once, 1 of 4 cycles Administration: 1,020 mg (06/17/2018) PACLitaxel (TAXOL) 138 mg in  sodium chloride 0.9 % 250 mL chemo infusion (</= 67m/m2), 80 mg/m2 = 138 mg, Intravenous,  Once, 0 of 4 cycles fosaprepitant (EMEND) 150 mg, dexamethasone (DECADRON) 12 mg in sodium chloride 0.9 % 145 mL IVPB, , Intravenous,  Once, 1 of 8 cycles Administration:  (06/17/2018)  for chemotherapy treatment.      CHIEF COMPLIANT: Cycle 1 Day 8 Adriamycin and Cytoxan   INTERVAL HISTORY: Katrina SWEANEYis a 58y.o. with above-mentioned history of triple negative right breast cancer. She presents to the clinic alone today. She notes severe fatigue, mild nausea, and abdominal cramps following treatment last week. She denies diarrhea, vomiting, or mouth sores but notes constipation for 4 days with small, hard stools when she has a BM. She denies any bone pain. She lost some weight and reports loss of taste but notes her appetite has been normal. She had muscle cramps in her left hand that resolved after about one minute. She reports she can feel the lump more prominently in her right breast. She has been working 4-5 hours per day during treatment. Her labs from today show: WBC 2.9, Hg 12.0, platelets 170.  REVIEW OF SYSTEMS:   Constitutional: Denies fevers, chills or abnormal weight loss (+) severe fatigue (+) loss of taste Eyes: Denies blurriness of vision Ears, nose, mouth, throat, and face: Denies mucositis or sore throat Respiratory: Denies cough, dyspnea or wheezes Cardiovascular: Denies palpitation, chest discomfort Gastrointestinal: Denies nausea, heartburn (+) nausea (+) constipation (+) abd cramps Skin: Denies abnormal skin rashes MSK: (+) left hand muscle cramps Lymphatics:  Denies new lymphadenopathy or easy bruising Neurological: Denies numbness, tingling or new weaknesses Behavioral/Psych: Mood is stable, no new changes  Extremities: No lower extremity edema Breast: denies any pain in either breasts (+) palpable lump, right breast All other systems were reviewed with the patient and are  negative.  I have reviewed the past medical history, past surgical history, social history and family history with the patient and they are unchanged from previous note.  ALLERGIES:  is allergic to lisinopril.  MEDICATIONS:  Current Outpatient Medications  Medication Sig Dispense Refill  . Ascorbic Acid (VITAMIN C) 500 MG CAPS Take 500 mg by mouth daily.     [name] aspirin EC 81 MG EC tablet Take 1 tablet (81 mg total) by mouth daily. 90 tablet 0  . Biotin 10000 MCG TABS Take 10,000 mcg by mouth daily.    . Calcium Carbonate-Vitamin D (CALCIUM 500/D PO) Take by mouth.    . chlorthalidone (HYGROTON) 25 MG tablet Take 1 tablet (25 mg total) by mouth daily. (Patient not taking: Reported on 06/04/2018) 90 tablet 2  . Cyanocobalamin (VITAMIN B 12 PO) Take by mouth.    . dexamethasone (DECADRON) 4 MG tablet Take 1 tablet (4 mg total) by mouth daily. Take 1 tablet day after chemo and 1 tablet 2 days after chemo with food 8 tablet 0  . dicyclomine (BENTYL) 10 MG capsule Take 1 capsule (10 mg total) by mouth 2 (two) times daily as needed for spasms. 30 capsule 1  . Flaxseed, Linseed, (FLAX SEED OIL) 1000 MG CAPS Take 1,000 mg by mouth once a week.    . hydrochlorothiazide (HYDRODIURIL) 25 MG tablet Take 25 mg by mouth daily.    [name] HYDROcodone-Acetaminophen (LORTAB) 10-300 MG/15ML SOLN Take 5 mLs by mouth every 6 (six) hours as needed. Do not drive, work, or operate machinery while taking this medication (Patient not taking: Reported on 06/04/2018) 100 mL 0  . labetalol (NORMODYNE) 100 MG tablet Take 1 tablet (100 mg total) by mouth 2 (two) times daily. 180 tablet 3  . lidocaine-prilocaine (EMLA) cream Apply to affected area once 30 g 3  . LORazepam (ATIVAN) 0.5 MG tablet Take 1 tablet (0.5 mg total) by mouth at bedtime as needed (Nausea or vomiting). 30 tablet 0  . mometasone (ELOCON) 0.1 % ointment Apply 1 application topically as needed (dry skin).     . Multiple Vitamin (MULTIVITAMIN) tablet Take 1 tablet by  mouth daily.    . ondansetron (ZOFRAN) 8 MG tablet Take 1 tablet (8 mg total) by mouth 2 (two) times daily as needed. Start on the third day after chemotherapy. 30 tablet 1  . prochlorperazine (COMPAZINE) 10 MG tablet Take 1 tablet (10 mg total) by mouth every 6 (six) hours as needed (Nausea or vomiting). 30 tablet 1  . Turmeric 500 MG TABS Take 500 mg by mouth daily.    [name] zinc gluconate 50 MG tablet Take 50 mg by mouth daily.     No current facility-administered medications for this visit.     PHYSICAL EXAMINATION: ECOG PERFORMANCE STATUS: 1 - Symptomatic but completely ambulatory  Vitals:   06/24/18 1414  BP: 116/80  Pulse: 78  Resp: 20  Temp: 98 F (36.7 C)  SpO2: 100%   Filed Weights   06/24/18 1414  Weight: 139 lb 8 oz (63.3 kg)    GENERAL: alert, no distress and comfortable SKIN: skin color, texture, turgor are normal, no rashes or significant lesions EYES: normal, Conjunctiva are pink and  non-injected, sclera clear OROPHARYNX: no exudate, no erythema and lips, buccal mucosa, and tongue normal  NECK: supple, thyroid normal size, non-tender, without nodularity LYMPH: no palpable lymphadenopathy in the cervical, axillary or inguinal LUNGS: clear to auscultation and percussion with normal breathing effort HEART: regular rate & rhythm and no murmurs and no lower extremity edema ABDOMEN: abdomen soft, non-tender and normal bowel sounds MUSCULOSKELETAL: no cyanosis of digits and no clubbing  NEURO: alert & oriented x 3 with fluent speech, no focal motor/sensory deficits EXTREMITIES: No lower extremity edema BREAST: Palpable mass in right breast. No palpable masses or nodules in left breast. No palpable axillary supraclavicular or infraclavicular adenopathy; no breast tenderness or nipple discharge. (exam performed in the presence of a chaperone)  LABORATORY DATA:  I have reviewed the data as listed CMP Latest Ref Rng & Units 06/17/2018 06/04/2018 12/22/2012  Glucose 70 - 99  mg/dL 93 103(H) 99  BUN 6 - 20 mg/dL '16 13 11  ' Creatinine 0.44 - 1.00 mg/dL 0.99 0.95 0.80  Sodium 135 - 145 mmol/L 141 141 141  Potassium 3.5 - 5.1 mmol/L 3.6 3.2(L) 3.5  Chloride 98 - 111 mmol/L 103 101 101  CO2 22 - 32 mmol/L '28 31 29  ' Calcium 8.9 - 10.3 mg/dL 9.6 10.1 10.0  Total Protein 6.5 - 8.1 g/dL 7.0 7.0 7.3  Total Bilirubin 0.3 - 1.2 mg/dL 0.3 0.8 0.5  Alkaline Phos 38 - 126 U/L 76 65 61  AST 15 - 41 U/L '20 16 15  ' ALT 0 - 44 U/L '16 12 11    ' Lab Results  Component Value Date   WBC 2.9 (L) 06/24/2018   HGB 12.0 06/24/2018   HCT 34.3 (L) 06/24/2018   MCV 87.9 06/24/2018   PLT 170 06/24/2018   NEUTROABS 1.1 (L) 06/24/2018    ASSESSMENT & PLAN:  Malignant neoplasm of upper-outer quadrant of right breast in female, estrogen receptor negative ([facility]) 05/29/2018:Right axillary pain which led to a mammogram and ultrasound that revealed a new mass in the superior UOQ tail of the breast 10 o'clock position by ultrasound measured 8 mm, axilla negative, 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 Neoadjuvant chemotherapy with Adriamycin and Cytoxan dose dense 4 followed byTaxolweekly 12and carboplatin every 3 weeks 2. Followed by breast conserving surgery with sentinel lymph node study  3. Followed by adjuvant radiation therapy Genetics consultation is happening this week Echocardiogram 06/10/2018: EF greater than 65% Breast MRI 06/09/2018: 1.1 cm enhancing mass UOQ right breast ---------------------------------------------------------------------------------------------------------------------------------- Current treatment: Cycle 1 day 8 dose dense Adriamycin and Cytoxan Chemo toxicities: 1.  Abdominal cramps lasted 3 days: Gave Bentyl prescription 2.  Mild nausea 3.  Fatigue lasted 3 days  Labs have been reviewed  Return to clinic in 1 week for cycle 2    No orders of the defined types were  placed in this encounter.  The patient has a good understanding of the overall plan. she agrees with it. she will call with any problems that may develop before the next visit here.  Nicholas Lose, MD 06/24/2018  Julious Oka Dorshimer am acting as scribe for Dr. Nicholas Lose.  I have reviewed the above documentation for accuracy and completeness, and I agree with the above.

## 2018-06-20 NOTE — Telephone Encounter (Signed)
Pt called with concerns after 1st chemotherapy.  Pt c/o fatigue and having episodes of nausea.  Pt denies fever.    Nurse educated patient on potential side effects of chemotherapy including fatigue and nausea.  Nurse reviewed antiemetics with patient.  Encouraged good hydration, monitoring for changes such as fever, bleeding, and chills.  Patient voiced understanding, stated "I just wanted to make sure this was normal, I feel better after speaking with someone."  No further needs at this time.

## 2018-06-23 ENCOUNTER — Other Ambulatory Visit: Payer: Self-pay | Admitting: Genetics

## 2018-06-23 DIAGNOSIS — Z171 Estrogen receptor negative status [ER-]: Principal | ICD-10-CM

## 2018-06-23 DIAGNOSIS — C50411 Malignant neoplasm of upper-outer quadrant of right female breast: Secondary | ICD-10-CM

## 2018-06-24 ENCOUNTER — Encounter: Payer: Self-pay | Admitting: *Deleted

## 2018-06-24 ENCOUNTER — Encounter: Payer: Self-pay | Admitting: Genetics

## 2018-06-24 ENCOUNTER — Inpatient Hospital Stay (HOSPITAL_BASED_OUTPATIENT_CLINIC_OR_DEPARTMENT_OTHER): Payer: Managed Care, Other (non HMO) | Admitting: Hematology and Oncology

## 2018-06-24 ENCOUNTER — Inpatient Hospital Stay (HOSPITAL_BASED_OUTPATIENT_CLINIC_OR_DEPARTMENT_OTHER): Payer: Managed Care, Other (non HMO) | Admitting: Genetics

## 2018-06-24 ENCOUNTER — Inpatient Hospital Stay: Payer: Managed Care, Other (non HMO)

## 2018-06-24 DIAGNOSIS — Z171 Estrogen receptor negative status [ER-]: Secondary | ICD-10-CM | POA: Diagnosis not present

## 2018-06-24 DIAGNOSIS — Z8042 Family history of malignant neoplasm of prostate: Secondary | ICD-10-CM

## 2018-06-24 DIAGNOSIS — C50411 Malignant neoplasm of upper-outer quadrant of right female breast: Secondary | ICD-10-CM

## 2018-06-24 DIAGNOSIS — Z1379 Encounter for other screening for genetic and chromosomal anomalies: Secondary | ICD-10-CM | POA: Insufficient documentation

## 2018-06-24 DIAGNOSIS — Z95828 Presence of other vascular implants and grafts: Secondary | ICD-10-CM

## 2018-06-24 LAB — CMP (CANCER CENTER ONLY)
ALT: 17 U/L (ref 0–44)
ANION GAP: 10 (ref 5–15)
AST: 14 U/L — ABNORMAL LOW (ref 15–41)
Albumin: 3.9 g/dL (ref 3.5–5.0)
Alkaline Phosphatase: 103 U/L (ref 38–126)
BUN: 17 mg/dL (ref 6–20)
CO2: 29 mmol/L (ref 22–32)
Calcium: 9.5 mg/dL (ref 8.9–10.3)
Chloride: 98 mmol/L (ref 98–111)
Creatinine: 0.96 mg/dL (ref 0.44–1.00)
GFR, Est AFR Am: >60 mL/min (ref >60)
GFR, Estimated: >60 mL/min (ref >60)
Glucose, Bld: 104 mg/dL — ABNORMAL HIGH (ref 70–99)
POTASSIUM: 3.1 mmol/L — AB (ref 3.5–5.1)
Sodium: 137 mmol/L (ref 135–145)
Total Bilirubin: 0.4 mg/dL (ref 0.3–1.2)
Total Protein: 6.9 g/dL (ref 6.5–8.1)

## 2018-06-24 LAB — CBC WITH DIFFERENTIAL (CANCER CENTER ONLY)
Abs Immature Granulocytes: 0.05 10*3/uL (ref 0.00–0.07)
Basophils Absolute: 0.1 10*3/uL (ref 0.0–0.1)
Basophils Relative: 3 %
Eosinophils Absolute: 0.1 10*3/uL (ref 0.0–0.5)
Eosinophils Relative: 3 %
HCT: 34.3 % — ABNORMAL LOW (ref 36.0–46.0)
Hemoglobin: 12 g/dL (ref 12.0–15.0)
IMMATURE GRANULOCYTES: 2 %
Lymphocytes Relative: 44 %
Lymphs Abs: 1.3 10*3/uL (ref 0.7–4.0)
MCH: 30.8 pg (ref 26.0–34.0)
MCHC: 35 g/dL (ref 30.0–36.0)
MCV: 87.9 fL (ref 80.0–100.0)
Monocytes Absolute: 0.3 10*3/uL (ref 0.1–1.0)
Monocytes Relative: 10 %
Neutro Abs: 1.1 10*3/uL — ABNORMAL LOW (ref 1.7–7.7)
Neutrophils Relative %: 38 %
PLATELETS: 170 10*3/uL (ref 150–400)
RBC: 3.9 MIL/uL (ref 3.87–5.11)
RDW: 11.9 % (ref 11.5–15.5)
WBC: 2.9 10*3/uL — AB (ref 4.0–10.5)
nRBC: 0 % (ref 0.0–0.2)

## 2018-06-24 MED ORDER — HEPARIN SOD (PORK) LOCK FLUSH 100 UNIT/ML IV SOLN
500.0000 [IU] | Freq: Once | INTRAVENOUS | Status: AC
  Administered 2018-06-24: 500 [IU] via INTRAVENOUS
  Filled 2018-06-24: qty 5

## 2018-06-24 MED ORDER — DICYCLOMINE HCL 10 MG PO CAPS: 10.0000 mg | ORAL_CAPSULE | Freq: Two times a day (BID) | ORAL | 1 refills | Status: DC | PRN

## 2018-06-24 MED ORDER — SODIUM CHLORIDE 0.9% FLUSH
10.0000 mL | INTRAVENOUS | Status: DC | PRN
  Administered 2018-06-24: 10 mL via INTRAVENOUS
  Filled 2018-06-24: qty 10

## 2018-06-24 NOTE — Assessment & Plan Note (Addendum)
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 Neoadjuvant chemotherapy with Adriamycin and Cytoxan dose dense 4 followed byTaxolweekly 12and carboplatin every 3 weeks 2. Followed by breast conserving surgery with sentinel lymph node study  3. Followed by adjuvant radiation therapy Genetics consultation is happening this week Echocardiogram 06/10/2018: EF greater than 65% Breast MRI 06/09/2018: 1.1 cm enhancing mass UOQ right breast ---------------------------------------------------------------------------------------------------------------------------------- Current treatment: Cycle 1 day 8 dose dense Adriamycin and Cytoxan Chemo toxicities: 1.  Abdominal cramps lasted 3 days: Gave Bentyl prescription 2.  Mild nausea 3.  Fatigue lasted 3 days  Labs have been reviewed  Return to clinic in 1 week for cycle 2

## 2018-06-24 NOTE — Progress Notes (Signed)
REFERRING PROVIDER: Nicholas Lose, MD [address] [place], [place] [address]  PRIMARY PROVIDER:  [name], MD  PRIMARY REASON FOR VISIT:  1. Malignant neoplasm of upper-outer quadrant of right breast in female, estrogen receptor negative ([place])   2. Family history of prostate cancer    HISTORY OF PRESENT ILLNESS:   Ms. Franken, a 58 y.o. female, was seen for a [facility] cancer genetics consultation at the request of Dr. Lindi Adie due to a personal and family history of cancer.  Ms. Hodsdon presents to clinic today to discuss the possibility of a hereditary predisposition to cancer, genetic testing, and to further clarify her future cancer risks, as well as potential cancer risks for family members.   In 2020, at the age of 89, Ms. Charpentier was diagnosed with triple negative invasive ductal carcinoma of the right breast.  She is currently undergoing neoadjuvant chemotherapy. She is planning breast conserving surgery and adjuvant radiation.   CANCER HISTORY:    Malignant neoplasm of upper-outer quadrant of right breast in female, estrogen receptor negative ([place])   05/29/2018 Initial Diagnosis    Right axillary pain which led to a mammogram and ultrasound that revealed a new mass in the superior UOQ tail of the breast 10 o'clock position by ultrasound measured 8 mm, axilla negative, biopsy revealed grade 3 IDC triple negative with a Ki-67 of 20%, T1BN0 stage Ib clinical stage    06/04/2018 Cancer Staging    Staging form: Breast, AJCC 8th Edition - Clinical stage from 06/04/2018: Stage IB (cT1b, cN0, cM0, G3, ER-, PR-, HER2-) - Signed by Nicholas Lose, MD on 06/04/2018    06/17/2018 -  Chemotherapy    The patient had DOXOrubicin (ADRIAMYCIN) chemo injection 102 mg, 60 mg/m2 = 102 mg, Intravenous,  Once, 1 of 4 cycles Administration: 102 mg (06/17/2018) palonosetron (ALOXI) injection 0.25 mg, 0.25 mg, Intravenous,  Once, 1 of 8 cycles Administration: 0.25 mg (06/17/2018) pegfilgrastim  (NEULASTA ONPRO KIT) injection 6 mg, 6 mg, Subcutaneous, Once, 1 of 4 cycles Administration: 6 mg (06/17/2018) CARBOplatin (PARAPLATIN) 530 mg in sodium chloride 0.9 % 250 mL chemo infusion, 530 mg (100 % of original dose 532.8 mg), Intravenous,  Once, 0 of 4 cycles Dose modification:   (original dose 532.8 mg, Cycle 5) cyclophosphamide (CYTOXAN) 1,020 mg in sodium chloride 0.9 % 250 mL chemo infusion, 600 mg/m2 = 1,020 mg, Intravenous,  Once, 1 of 4 cycles Administration: 1,020 mg (06/17/2018) PACLitaxel (TAXOL) 138 mg in sodium chloride 0.9 % 250 mL chemo infusion (</= 39m/m2), 80 mg/m2 = 138 mg, Intravenous,  Once, 0 of 4 cycles fosaprepitant (EMEND) 150 mg, dexamethasone (DECADRON) 12 mg in sodium chloride 0.9 % 145 mL IVPB, , Intravenous,  Once, 1 of 8 cycles Administration:  (06/17/2018)  for chemotherapy treatment.       HORMONAL RISK FACTORS:  First live birth at age 58  Ovaries intact: yes.  Hysterectomy: yes.  Menopausal status: postmenopausal.  Colonoscopy: yes; 2011- reportedly normal. Mammogram within the last year: yes.  Past Medical History:  Diagnosis Date  . Anemia   . Family history of prostate cancer   . GERD (gastroesophageal reflux disease)    h/o - not now  . Headache(784.0)   . Hypertension   . S/P abdominal hysterectomy 04/17/2011  . Stroke ([facility]     Past Surgical History:  Procedure Laterality Date  . ABDOMINAL HYSTERECTOMY  04/16/2011   Procedure: HYSTERECTOMY ABDOMINAL;  Surgeon: SMarvene Staff MD;  [facility];  Service:  Gynecology;  Laterality: N/A;  . BREAST BIOPSY Left 2013  . KIDNEY STONE SURGERY    . LAPAROTOMY     ectopic pregnancy  . LOOP RECORDER IMPLANT N/A 12/24/2012   Procedure: LOOP RECORDER IMPLANT;  Surgeon: Deboraha Sprang, MD;  Location: [facility] CATH LAB;  Service: Cardiovascular;  Laterality: N/A;  . LOOP RECORDER REMOVAL N/A 08/15/2016   Procedure: Loop Recorder Removal;  Surgeon: Deboraha Sprang, MD;  Location: [facility]  CV LAB;  Service: Cardiovascular;  Laterality: N/A;  . SHOULDER ARTHROSCOPY    . TEE WITHOUT CARDIOVERSION N/A 12/24/2012   Procedure: TRANSESOPHAGEAL ECHOCARDIOGRAM (TEE);  Surgeon: Jettie Booze, MD;  Location: [facility] ENDOSCOPY;  Service: Cardiovascular;  Laterality: N/A;    Social History   Socioeconomic History  . Marital status: Divorced    Spouse name: Not on file  . Number of children: 1  . Years of education: 6  . Highest education level: Not on file  Occupational History  . Occupation: [employer]    Employer: [employer]  Social Needs  . Financial resource strain: Not on file  . Food insecurity:    Worry: Not on file    Inability: Not on file  . Transportation needs:    Medical: Not on file    Non-medical: Not on file  Tobacco Use  . Smoking status: Never Smoker  . Smokeless tobacco: Never Used  Substance and Sexual Activity  . Alcohol use: No    Alcohol/week: 0.0 standard drinks  . Drug use: No  . Sexual activity: Not on file  Lifestyle  . Physical activity:    Days per week: Not on file    Minutes per session: Not on file  . Stress: Not on file  Relationships  . Social connections:    Talks on phone: Not on file    Gets together: Not on file    Attends religious service: Not on file    Active member of club or organization: Not on file    Attends meetings of clubs or organizations: Not on file    Relationship status: Not on file  Other Topics Concern  . Not on file  Social History Narrative   Patient is single with one child.   Patient is left handed.   Patient has hs education.   Patient drinks 1/2 cup daily.     FAMILY HISTORY:  We obtained a detailed, 4-generation family history.  Significant diagnoses are listed below: Family History  Problem Relation Age of Onset  . Stroke Father 25  . Lung cancer Father   . Prostate cancer Paternal Uncle        dx 59's?  . Diabetes Maternal Grandfather   . Breast cancer Neg Hx      Ms. Maret has a 56 year-old daughter with no hx of cancer. She has a brother and a sister in their 77's with no hx of cancer.   Ms. Adcox father: deceased, lung cancer Paternal aunts/Uncles: 2 paternal uncles, 37 had prostate cancer Paternal cousins: no known hx of cancer Paternal grandfather: died with a heart attack Paternal grandmother:no hx of cancer  Ms. Bettendorf's mother: 2, no hx of cancer.  She had a complete hysterectomy Maternal Aunts/Uncles: 2 maternal aunts and 2 maternal uncles, no known hx of cancer.  Maternal cousins: no known hx of cancer. A 4th cousin had breast cancer.  Maternal grandfather: diabetes, deceased Maternal grandmother:died young, no known hx of cancer.   Ms. Gibby  is unaware of previous family history of genetic testing for hereditary cancer risks. Patient's maternal ancestors are of Black descent, and paternal ancestors are of Black descent. There is no reported Ashkenazi Jewish ancestry. There is no known consanguinity.  GENETIC COUNSELING ASSESSMENT: JADASIA HAWS is a 58 y.o. female with a personal and family history which is somewhat suggestive of a Hereditary Cancer Predisposition Syndrome. We, therefore, discussed and recommended the following at today's visit.   DISCUSSION: We reviewed the characteristics, features and inheritance patterns of hereditary cancer syndromes. We also discussed genetic testing, including the appropriate family members to test, the process of testing, insurance coverage and turn-around-time for results. We discussed the implications of a negative, positive and/or variant of uncertain significant result. We recommended Ms. Espino pursue genetic testing for the CancerNext + RNAinsight gene panel.   The CancerNext gene panel offered by [employer] includes sequencing and rearrangement analysis for the following 32 genes:   APC, ATM, BARD1, BMPR1A, BRCA1, BRCA2, BRIP1, CDH1, CDK4, CDKN2A, CHEK2, DICER1, EPCAM, GREM1,  HOXB13MLH1, MRE11A, MSH2, MSH6, MUTYH, NBN, NF1, PALB2, PMS2, POLD1, POLE, PTEN, RAD50, RAD51C, RAD51D, SMAD4, SMARCA4, STK11, and TP53.   We discussed that only 5-10% of cancers are associated with a Hereditary cancer predisposition syndrome.  One of the most common hereditary cancer syndromes that increases breast cancer risk is called Hereditary Breast and Ovarian Cancer (HBOC) syndrome.  This syndrome is caused by mutations in the BRCA1 and BRCA2 genes.  This syndrome increases an individual's lifetime risk to develop breast, ovarian, pancreatic, and other types of cancer.  There are also many other cancer predisposition syndromes caused by mutations in several other genes.  We discussed that if she is found to have a mutation in one of these genes, it may impact surgical decisions, and alter future medical management recommendations such as increased cancer screenings and consideration of risk reducing surgeries.  A positive result could also have implications for the patient's family members.  A Negative result would mean we were unable to identify a hereditary component to her cancer, but does not rule out the possibility of a hereditary basis for her cancer.  There could be mutations that are undetectable by current technology, or in genes not yet tested or identified to increase cancer risk.    We discussed the potential to find a Variant of Uncertain Significance or VUS.  These are variants that have not yet been identified as pathogenic or benign, and it is unknown if this variant is associated with increased cancer risk or if this is a normal finding.  Most VUS's are reclassified to benign or likely benign.   It should not be used to make medical management decisions. With time, we suspect the lab will determine the significance of any VUS's identified if any.   Based on Ms. Glace's personal and family history of cancer, she meets medical criteria for genetic testing. Despite that she meets  criteria, she may still have an out of pocket cost. The laboratory can provide her with an estimate of her OOP cost. she was given the contact information of the laboratory if she has further questions.   PLAN:  Despite our recommendation, Ms. Luscher did not wish to pursue genetic testing at today's visit. We understand this decision, and remain available to coordinate genetic testing at any time in the future. We; therefore, recommend Ms. Thieme continue to follow the cancer screening guidelines given by her primary healthcare provider.  She would like to discuss  this testing with her daughter before deciding.  She will be in [place] in a few weeks.   Lastly, we encouraged Ms. Placzek to remain in contact with cancer genetics annually so that we can continuously update the family history and inform her of any changes in cancer genetics and testing that may be of benefit for this family.   Ms.  Raz questions were answered to her satisfaction today. Our contact information was provided should additional questions or concerns arise. Thank you for the referral and allowing [place] to share in the care of your patient.   Tana Felts, MS, [facility] Certified Genetic Counselor [email or web address] phone: [phone]  The patient was seen for a total of 30 minutes in face-to-face genetic counseling.  This patient was discussed with Drs. Magrinat, Lindi Adie and/or Burr Medico who agrees with the above.

## 2018-06-25 ENCOUNTER — Telehealth: Payer: Self-pay | Admitting: Hematology and Oncology

## 2018-06-25 ENCOUNTER — Encounter: Payer: Managed Care, Other (non HMO) | Admitting: Genetic Counselor

## 2018-06-25 ENCOUNTER — Other Ambulatory Visit: Payer: Managed Care, Other (non HMO)

## 2018-06-25 NOTE — Telephone Encounter (Signed)
No los °

## 2018-06-26 ENCOUNTER — Telehealth: Payer: Self-pay | Admitting: Genetics

## 2018-06-26 ENCOUNTER — Other Ambulatory Visit: Payer: Self-pay | Admitting: Genetics

## 2018-06-26 DIAGNOSIS — C50411 Malignant neoplasm of upper-outer quadrant of right female breast: Secondary | ICD-10-CM

## 2018-06-26 DIAGNOSIS — Z8042 Family history of malignant neoplasm of prostate: Secondary | ICD-10-CM

## 2018-06-26 DIAGNOSIS — Z171 Estrogen receptor negative status [ER-]: Principal | ICD-10-CM

## 2018-06-26 NOTE — Progress Notes (Signed)
Patient Care Team: [name], MD as PCP - General (Family Medicine) Excell Seltzer, MD as Consulting Physician (General Surgery) Nicholas Lose, MD as Consulting Physician (Hematology and Oncology) Kyung Rudd, MD as Consulting Physician (Radiation Oncology)  DIAGNOSIS:    ICD-10-CM   1. 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%, T1BN0 stage Ib clinical stage    06/04/2018 Cancer Staging    Staging form: Breast, AJCC 8th Edition - Clinical stage from 06/04/2018: Stage IB (cT1b, cN0, cM0, G3, ER-, PR-, HER2-) - Signed by Nicholas Lose, MD on 06/04/2018    06/17/2018 -  Chemotherapy    The patient had DOXOrubicin (ADRIAMYCIN) chemo injection 102 mg, 60 mg/m2 = 102 mg, Intravenous,  Once, 1 of 4 cycles Administration: 102 mg (06/17/2018) palonosetron (ALOXI) injection 0.25 mg, 0.25 mg, Intravenous,  Once, 1 of 8 cycles Administration: 0.25 mg (06/17/2018) pegfilgrastim (NEULASTA ONPRO KIT) injection 6 mg, 6 mg, Subcutaneous, Once, 1 of 4 cycles Administration: 6 mg (06/17/2018) CARBOplatin (PARAPLATIN) 530 mg in sodium chloride 0.9 % 250 mL chemo infusion, 530 mg (100 % of original dose 532.8 mg), Intravenous,  Once, 0 of 4 cycles Dose modification:   (original dose 532.8 mg, Cycle 5) cyclophosphamide (CYTOXAN) 1,020 mg in sodium chloride 0.9 % 250 mL chemo infusion, 600 mg/m2 = 1,020 mg, Intravenous,  Once, 1 of 4 cycles Administration: 1,020 mg (06/17/2018) PACLitaxel (TAXOL) 138 mg in  sodium chloride 0.9 % 250 mL chemo infusion (</= 48m/m2), 80 mg/m2 = 138 mg, Intravenous,  Once, 0 of 4 cycles fosaprepitant (EMEND) 150 mg, dexamethasone (DECADRON) 12 mg in sodium chloride 0.9 % 145 mL IVPB, , Intravenous,  Once, 1 of 8 cycles Administration:  (06/17/2018)  for chemotherapy treatment.      CHIEF COMPLIANT: Cycle 2 Adriamycin and Cytoxan  INTERVAL HISTORY: Katrina REALIis a 58y.o. with above-mentioned history of  triple negative right breast cancer.She presents to the clinic alone today for cycle 2 of dose dense adriamycin and cytoxan. She presents to the clinic today and is doing very well and has been working this week. She reports the abdominal cramps have improved with medication and occur occasionally and are mild. She has tried to eat more protein recently. She fell yesterday and has a few scrapes on her legs but is otherwise okay. She questioned if she could cook with turmeric, as she had to stop taking turmeric supplements. Her labs from today show: WBC 7.7, Hg 11.2, platelets 182.   REVIEW OF SYSTEMS:   Constitutional: Denies fevers, chills or abnormal weight loss Eyes: Denies blurriness of vision Ears, nose, mouth, throat, and face: Denies mucositis or sore throat Respiratory: Denies cough, dyspnea or wheezes Cardiovascular: Denies palpitation, chest discomfort Gastrointestinal: Denies nausea, heartburn or change in bowel habits Skin: Denies abnormal skin rashes Lymphatics: Denies new lymphadenopathy or easy bruising Neurological: Denies numbness, tingling or new weaknesses Behavioral/Psych: Mood is stable, no new changes  Extremities: No lower extremity edema Breast: denies any pain  or lumps or nodules in either breasts All other systems were reviewed with the patient and are negative.  I have reviewed the past medical history, past surgical history, social history and family history with the patient and they are unchanged from previous note.  ALLERGIES:   is allergic to lisinopril.  MEDICATIONS:  Current Outpatient Medications  Medication Sig Dispense Refill  . Ascorbic Acid (VITAMIN C) 500 MG CAPS Take 500 mg by mouth daily.     [name] aspirin EC 81 MG EC tablet Take 1 tablet (81 mg total) by mouth daily. 90 tablet 0  . Biotin 10000 MCG TABS Take 10,000 mcg by mouth daily.    . Calcium Carbonate-Vitamin D (CALCIUM 500/D PO) Take by mouth.    . chlorthalidone (HYGROTON) 25 MG tablet Take 1 tablet (25 mg total) by mouth daily. (Patient not taking: Reported on 06/04/2018) 90 tablet 2  . Cyanocobalamin (VITAMIN B 12 PO) Take by mouth.    . dexamethasone (DECADRON) 4 MG tablet Take 1 tablet (4 mg total) by mouth daily. Take 1 tablet day after chemo and 1 tablet 2 days after chemo with food 8 tablet 0  . dicyclomine (BENTYL) 10 MG capsule Take 1 capsule (10 mg total) by mouth 2 (two) times daily as needed for spasms. 30 capsule 1  . Flaxseed, Linseed, (FLAX SEED OIL) 1000 MG CAPS Take 1,000 mg by mouth once a week.    . hydrochlorothiazide (HYDRODIURIL) 25 MG tablet Take 25 mg by mouth daily.    [name] HYDROcodone-Acetaminophen (LORTAB) 10-300 MG/15ML SOLN Take 5 mLs by mouth every 6 (six) hours as needed. Do not drive, work, or operate machinery while taking this medication (Patient not taking: Reported on 06/04/2018) 100 mL 0  . labetalol (NORMODYNE) 100 MG tablet Take 1 tablet (100 mg total) by mouth 2 (two) times daily. 180 tablet 3  . lidocaine-prilocaine (EMLA) cream Apply to affected area once 30 g 3  . LORazepam (ATIVAN) 0.5 MG tablet Take 1 tablet (0.5 mg total) by mouth at bedtime as needed (Nausea or vomiting). 30 tablet 0  . mometasone (ELOCON) 0.1 % ointment Apply 1 application topically as needed (dry skin).     . Multiple Vitamin (MULTIVITAMIN) tablet Take 1 tablet by mouth daily.    . ondansetron (ZOFRAN) 8 MG tablet Take 1 tablet (8 mg total) by mouth 2 (two) times daily as needed. Start on the third day after chemotherapy. 30 tablet 1  .  prochlorperazine (COMPAZINE) 10 MG tablet Take 1 tablet (10 mg total) by mouth every 6 (six) hours as needed (Nausea or vomiting). 30 tablet 1  . Turmeric 500 MG TABS Take 500 mg by mouth daily.    [name] zinc gluconate 50 MG tablet Take 50 mg by mouth daily.     No current facility-administered medications for this visit.     PHYSICAL EXAMINATION: ECOG PERFORMANCE STATUS: 1 - Symptomatic but completely ambulatory  Vitals:   07/01/18 1338  BP: 120/76  Pulse: 73  Resp: 18  Temp: 98 F (36.7 C)  SpO2: 100%   Filed Weights   07/01/18 1338  Weight: 142 lb 11.2 oz (64.7 kg)    GENERAL: alert, no distress and comfortable SKIN: skin color, texture, turgor are normal, no rashes or significant lesions EYES: normal, Conjunctiva are pink and non-injected, sclera clear OROPHARYNX: no exudate, no erythema and lips, buccal mucosa, and tongue normal  NECK: supple, thyroid normal size, non-tender, without nodularity LYMPH: no palpable lymphadenopathy in the cervical,  axillary or inguinal LUNGS: clear to auscultation and percussion with normal breathing effort HEART: regular rate & rhythm and no murmurs and no lower extremity edema ABDOMEN: abdomen soft, non-tender and normal bowel sounds MUSCULOSKELETAL: no cyanosis of digits and no clubbing  NEURO: alert & oriented x 3 with fluent speech, no focal motor/sensory deficits EXTREMITIES: No lower extremity edema  LABORATORY DATA:  I have reviewed the data as listed CMP Latest Ref Rng & Units 06/24/2018 06/17/2018 06/04/2018  Glucose 70 - 99 mg/dL 104(H) 93 103(H)  BUN 6 - 20 mg/dL _0 Creatinine 0.44 - 1.00 mg/dL 0.96 0.99 0.95  Sodium 135 - 145 mmol/L 137 141 141  Potassium 3.5 - 5.1 mmol/L 3.1(L) 3.6 3.2(L)  Chloride 98 - 111 mmol/L 98 103 101  CO2 22 - 32 mmol/L _1 Calcium 8.9 - 10.3 mg/dL 9.5 9.6 10.1  Total Protein 6.5 - 8.1 g/dL 6.9 7.0 7.0  Total Bilirubin 0.3 - 1.2 mg/dL 0.4 0.3 0.8  Alkaline Phos 38 - 126 U/L 103 76 65    AST 15 - 41 U/L 14(L) 20 16  ALT 0 - 44 U/L _2 Lab Results  Component Value Date   WBC 7.7 07/01/2018   HGB 11.2 (L) 07/01/2018   HCT 33.1 (L) 07/01/2018   MCV 90.7 07/01/2018   PLT 182 07/01/2018   NEUTROABS PENDING 07/01/2018    ASSESSMENT & PLAN:  Malignant neoplasm of upper-outer quadrant of right breast in female, estrogen receptor negative ([facility]) 05/29/2018:Right axillary pain which led to a mammogram and ultrasound that revealed a new mass in the superior UOQ tail of the breast 10 o'clock position by ultrasound measured 8 mm, axilla negative, 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 Neoadjuvant chemotherapy with Adriamycin and Cytoxan dose dense 4 followed byTaxolweekly 12and carboplatin every 3 weeks 2. Followed by breast conserving surgery with sentinel lymph node study  3. Followed by adjuvant radiation therapy Genetics consultation is happening this week Echocardiogram 06/10/2018: EF greater than 65% Breast MRI 06/09/2018: 1.1 cm enhancing mass UOQ right breast ---------------------------------------------------------------------------------------------------------------------------------- Current treatment: Cycle 2 day 1 dose dense Adriamycin and Cytoxan Chemo toxicities: 1.  Abdominal cramps lasted 3 days: Gave Bentyl prescription 2.  Mild nausea 3.  Fatigue lasted 3 days  Labs have been reviewed  Return to clinic in 2 weeks for cycle 3    No orders of the defined types were placed in this encounter.  The patient has a good understanding of the overall plan. she agrees with it. she will call with any problems that may develop before the next visit here.  Nicholas Lose, MD 07/01/2018  Katrina Liu am acting as scribe for Dr. Nicholas Lose.  I have reviewed the above documentation for accuracy and completeness, and I agree with the above.

## 2018-06-26 NOTE — Telephone Encounter (Signed)
Patient reached out to let us know she has talked with her daughter and she does want to know the genetic information.   [name] would like to proceed with genetic testing.  We will coordinate this and add it to her next lab appointment on Tuesday 07/01/2018.   Testing will be sent to [facility].

## 2018-07-01 ENCOUNTER — Inpatient Hospital Stay: Payer: Managed Care, Other (non HMO) | Attending: Hematology and Oncology

## 2018-07-01 ENCOUNTER — Inpatient Hospital Stay: Payer: Managed Care, Other (non HMO) | Admitting: Hematology and Oncology

## 2018-07-01 ENCOUNTER — Inpatient Hospital Stay: Payer: Managed Care, Other (non HMO)

## 2018-07-01 DIAGNOSIS — Z171 Estrogen receptor negative status [ER-]: Secondary | ICD-10-CM | POA: Insufficient documentation

## 2018-07-01 DIAGNOSIS — E860 Dehydration: Secondary | ICD-10-CM | POA: Insufficient documentation

## 2018-07-01 DIAGNOSIS — D6481 Anemia due to antineoplastic chemotherapy: Secondary | ICD-10-CM | POA: Diagnosis not present

## 2018-07-01 DIAGNOSIS — Z5189 Encounter for other specified aftercare: Secondary | ICD-10-CM | POA: Diagnosis not present

## 2018-07-01 DIAGNOSIS — E876 Hypokalemia: Secondary | ICD-10-CM | POA: Insufficient documentation

## 2018-07-01 DIAGNOSIS — R42 Dizziness and giddiness: Secondary | ICD-10-CM | POA: Diagnosis not present

## 2018-07-01 DIAGNOSIS — Z95828 Presence of other vascular implants and grafts: Secondary | ICD-10-CM

## 2018-07-01 DIAGNOSIS — Z452 Encounter for adjustment and management of vascular access device: Secondary | ICD-10-CM | POA: Insufficient documentation

## 2018-07-01 DIAGNOSIS — C50411 Malignant neoplasm of upper-outer quadrant of right female breast: Secondary | ICD-10-CM | POA: Diagnosis present

## 2018-07-01 DIAGNOSIS — Z8042 Family history of malignant neoplasm of prostate: Secondary | ICD-10-CM

## 2018-07-01 DIAGNOSIS — Z5111 Encounter for antineoplastic chemotherapy: Secondary | ICD-10-CM | POA: Diagnosis present

## 2018-07-01 LAB — CBC WITH DIFFERENTIAL (CANCER CENTER ONLY)
Abs Immature Granulocytes: 0.6 10*3/uL — ABNORMAL HIGH (ref 0.00–0.07)
Band Neutrophils: 7 %
Basophils Absolute: 0.1 10*3/uL (ref 0.0–0.1)
Basophils Relative: 1 %
Eosinophils Absolute: 0.2 10*3/uL (ref 0.0–0.5)
Eosinophils Relative: 3 %
HCT: 33.1 % — ABNORMAL LOW (ref 36.0–46.0)
Hemoglobin: 11.2 g/dL — ABNORMAL LOW (ref 12.0–15.0)
Lymphocytes Relative: 38 %
Lymphs Abs: 2.9 10*3/uL (ref 0.7–4.0)
MCH: 30.7 pg (ref 26.0–34.0)
MCHC: 33.8 g/dL (ref 30.0–36.0)
MCV: 90.7 fL (ref 80.0–100.0)
Metamyelocytes Relative: 5 %
Monocytes Absolute: 0.5 10*3/uL (ref 0.1–1.0)
Monocytes Relative: 7 %
Myelocytes: 3 %
Neutro Abs: 3.3 10*3/uL (ref 1.7–17.7)
Neutrophils Relative %: 36 %
Platelet Count: 182 10*3/uL (ref 150–400)
RBC: 3.65 MIL/uL — ABNORMAL LOW (ref 3.87–5.11)
RDW: 12.7 % (ref 11.5–15.5)
WBC Count: 7.7 10*3/uL (ref 4.0–10.5)
nRBC: 0.5 % — ABNORMAL HIGH (ref 0.0–0.2)

## 2018-07-01 LAB — CMP (CANCER CENTER ONLY)
ALT: 14 U/L (ref 0–44)
AST: 14 U/L — ABNORMAL LOW (ref 15–41)
Albumin: 3.8 g/dL (ref 3.5–5.0)
Alkaline Phosphatase: 97 U/L (ref 38–126)
Anion gap: 11 (ref 5–15)
BILIRUBIN TOTAL: 0.2 mg/dL — AB (ref 0.3–1.2)
BUN: 13 mg/dL (ref 6–20)
CO2: 27 mmol/L (ref 22–32)
Calcium: 8.7 mg/dL — ABNORMAL LOW (ref 8.9–10.3)
Chloride: 104 mmol/L (ref 98–111)
Creatinine: 1.01 mg/dL — ABNORMAL HIGH (ref 0.44–1.00)
GFR, Est AFR Am: >60 mL/min (ref >60)
GFR, Estimated: >60 mL/min (ref >60)
Glucose, Bld: 102 mg/dL — ABNORMAL HIGH (ref 70–99)
Potassium: 3.1 mmol/L — ABNORMAL LOW (ref 3.5–5.1)
Sodium: 142 mmol/L (ref 135–145)
Total Protein: 6.5 g/dL (ref 6.5–8.1)

## 2018-07-01 MED ORDER — PALONOSETRON HCL INJECTION 0.25 MG/5ML
0.2500 mg | Freq: Once | INTRAVENOUS | Status: AC
  Administered 2018-07-01: 0.25 mg via INTRAVENOUS

## 2018-07-01 MED ORDER — PEGFILGRASTIM 6 MG/0.6ML ~~LOC~~ PSKT
6.0000 mg | PREFILLED_SYRINGE | Freq: Once | SUBCUTANEOUS | Status: AC
  Administered 2018-07-01: 6 mg via SUBCUTANEOUS

## 2018-07-01 MED ORDER — SODIUM CHLORIDE 0.9 % IV SOLN
600.0000 mg/m2 | Freq: Once | INTRAVENOUS | Status: AC
  Administered 2018-07-01: 1020 mg via INTRAVENOUS
  Filled 2018-07-01: qty 51

## 2018-07-01 MED ORDER — PEGFILGRASTIM 6 MG/0.6ML ~~LOC~~ PSKT
PREFILLED_SYRINGE | SUBCUTANEOUS | Status: AC
  Filled 2018-07-01: qty 0.6

## 2018-07-01 MED ORDER — PALONOSETRON HCL INJECTION 0.25 MG/5ML
INTRAVENOUS | Status: AC
  Filled 2018-07-01: qty 5

## 2018-07-01 MED ORDER — SODIUM CHLORIDE 0.9 % IV SOLN
Freq: Once | INTRAVENOUS | Status: AC
  Administered 2018-07-01: 15:00:00 via INTRAVENOUS
  Filled 2018-07-01: qty 5

## 2018-07-01 MED ORDER — HEPARIN SOD (PORK) LOCK FLUSH 100 UNIT/ML IV SOLN
500.0000 [IU] | Freq: Once | INTRAVENOUS | Status: AC | PRN
  Administered 2018-07-01: 500 [IU]
  Filled 2018-07-01: qty 5

## 2018-07-01 MED ORDER — SODIUM CHLORIDE 0.9 % IV SOLN
Freq: Once | INTRAVENOUS | Status: AC
  Administered 2018-07-01: 14:00:00 via INTRAVENOUS
  Filled 2018-07-01: qty 250

## 2018-07-01 MED ORDER — SODIUM CHLORIDE 0.9% FLUSH
10.0000 mL | INTRAVENOUS | Status: DC | PRN
  Administered 2018-07-01: 10 mL
  Filled 2018-07-01: qty 10

## 2018-07-01 MED ORDER — SODIUM CHLORIDE 0.9% FLUSH
10.0000 mL | Freq: Once | INTRAVENOUS | Status: AC
  Administered 2018-07-01: 10 mL
  Filled 2018-07-01: qty 10

## 2018-07-01 MED ORDER — DOXORUBICIN HCL CHEMO IV INJECTION 2 MG/ML
60.0000 mg/m2 | Freq: Once | INTRAVENOUS | Status: AC
  Administered 2018-07-01: 102 mg via INTRAVENOUS
  Filled 2018-07-01: qty 51

## 2018-07-01 NOTE — Assessment & Plan Note (Signed)
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 Neoadjuvant chemotherapy with Adriamycin and Cytoxan dose dense 4 followed byTaxolweekly 12and carboplatin every 3 weeks 2. Followed by breast conserving surgery with sentinel lymph node study  3. Followed by adjuvant radiation therapy Genetics consultation is happening this week Echocardiogram 06/10/2018: EF greater than 65% Breast MRI 06/09/2018: 1.1 cm enhancing mass UOQ right breast ---------------------------------------------------------------------------------------------------------------------------------- Current treatment: Cycle 2 day 1 dose dense Adriamycin and Cytoxan Chemo toxicities: 1.  Abdominal cramps lasted 3 days: Gave Bentyl prescription 2.  Mild nausea 3.  Fatigue lasted 3 days  Labs have been reviewed  Return to clinic in 2 weeks for cycle 3

## 2018-07-01 NOTE — Patient Instructions (Signed)
[facility] Discharge Instructions for Patients Receiving Chemotherapy  Today you received the following chemotherapy agents: Doxorubicin (Adrimamycin) and Cyclophosphamide (Cytoxan)  To help prevent nausea and vomiting after your treatment, we encourage you to take your nausea medication as directed. Received Aloxi during your treatment today-->Take your Compazine or Ativan prescription (not your Zofran) for the next 3 days as needed.   If you develop nausea and vomiting that is not controlled by your nausea medication, call the clinic.   BELOW ARE SYMPTOMS THAT SHOULD BE REPORTED IMMEDIATELY:  *FEVER GREATER THAN 100.5 F  *CHILLS WITH OR WITHOUT FEVER  NAUSEA AND VOMITING THAT IS NOT CONTROLLED WITH YOUR NAUSEA MEDICATION  *UNUSUAL SHORTNESS OF BREATH  *UNUSUAL BRUISING OR BLEEDING  TENDERNESS IN MOUTH AND THROAT WITH OR WITHOUT PRESENCE OF ULCERS  *URINARY PROBLEMS  *BOWEL PROBLEMS  UNUSUAL RASH Items with * indicate a potential emergency and should be followed up as soon as possible.  Feel free to call the clinic should you have any questions or concerns. The clinic phone number is [phone].  Please show the [facility] at check-in to the Emergency Department and triage nurse.

## 2018-07-03 ENCOUNTER — Telehealth: Payer: Self-pay | Admitting: *Deleted

## 2018-07-03 NOTE — Telephone Encounter (Signed)
"  Katrina Liu [phone]) Have a question about possibly getting something for extreme constipation."   "I'm not straining but not emptying well.  Bowels move twice daily but its two to three marble sized or larger balls.  Passing gas twice a day.  Don't remember my last soft bowel movement before treatment started.    With history of hemorrhoids, I started stool softeners with treatment started 06-17-2018.    Felt bad cramps the Sunday after first treatment after nausea medication is when bowel problem started.    Cramping medicine called in 06-24-2018 not used yet.  No further cramps.  Stomach may have been bloated but not now.  No pain or tenderness.     Drinking warm prune juice, hot water, 70 oz bottled water daily, walking twice weekly.  Eating vegetables, apples, its and vegetables, no fast foods and I can't drink milk.  No laxatives used but I have Mira-Lax at home."  Patient at work reports "will be home about 3:00 pm."

## 2018-07-04 ENCOUNTER — Telehealth: Payer: Self-pay

## 2018-07-04 NOTE — Telephone Encounter (Signed)
Called pt to follow up on severe constipation. Pt states that she is feeling better today and had a good BM this morning. Pt eating/drinking well. Denies nausea or vomiting at this time. No further complaints or needs.

## 2018-07-09 ENCOUNTER — Encounter: Payer: Self-pay | Admitting: Hematology and Oncology

## 2018-07-09 ENCOUNTER — Telehealth: Payer: Self-pay

## 2018-07-09 NOTE — Telephone Encounter (Signed)
Called pt in regards to her mychart message about possibly working from home, instead of going to work to reduce her risk of getting coronavirus. Pt states that she was told by her supervisor if it would be best for her to stay and work at home for now, since she is going through chemo. Pt workplace are making every effort to reduce risk of coronavirus threat and advising people to stay home if possible. Told pt that it will be best if she stays home and is able to work from home. Pt asking if she is able to go out and walk. Told pt that it is ok for her to be outside and is encourage to help combat side effects of fatigue during chemo. Pt to take standard precaution to help prevent any infection with diligent hand hygiene, checking temperature, and staying away from congested and crowded places. Pt verbalized understanding and is appreciative of call.

## 2018-07-13 NOTE — Assessment & Plan Note (Signed)
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 Neoadjuvant chemotherapy with Adriamycin and Cytoxan dose dense 4 followed byTaxolweekly 12and carboplatin every 3 weeks 2. Followed by breast conserving surgery with sentinel lymph node study  3. Followed by adjuvant radiation therapy Genetics consultation is happening this week Echocardiogram 06/10/2018: EF greater than 65% Breast MRI 06/09/2018: 1.1 cm enhancing mass UOQ right breast ---------------------------------------------------------------------------------------------------------------------------------- Current treatment: Cycle 3 day1 dose dense Adriamycin and Cytoxan Chemo toxicities: 1.Abdominal cramps lasted 3 days: Gave Bentyl prescription 2.Mild nausea 3.Fatigue lasted 3 days  Labs have been reviewed  Return to clinic in 3 weeks for cycle 4

## 2018-07-14 ENCOUNTER — Inpatient Hospital Stay: Payer: Managed Care, Other (non HMO) | Admitting: Hematology and Oncology

## 2018-07-14 ENCOUNTER — Other Ambulatory Visit: Payer: Self-pay

## 2018-07-14 ENCOUNTER — Inpatient Hospital Stay: Payer: Managed Care, Other (non HMO)

## 2018-07-14 DIAGNOSIS — Z171 Estrogen receptor negative status [ER-]: Principal | ICD-10-CM

## 2018-07-14 DIAGNOSIS — C50411 Malignant neoplasm of upper-outer quadrant of right female breast: Secondary | ICD-10-CM

## 2018-07-14 DIAGNOSIS — D6481 Anemia due to antineoplastic chemotherapy: Secondary | ICD-10-CM | POA: Diagnosis not present

## 2018-07-14 DIAGNOSIS — Z5111 Encounter for antineoplastic chemotherapy: Secondary | ICD-10-CM | POA: Diagnosis not present

## 2018-07-14 DIAGNOSIS — Z95828 Presence of other vascular implants and grafts: Secondary | ICD-10-CM

## 2018-07-14 LAB — CBC WITH DIFFERENTIAL (CANCER CENTER ONLY)
Abs Immature Granulocytes: 1.89 10*3/uL — ABNORMAL HIGH (ref 0.00–0.07)
Basophils Absolute: 0.1 10*3/uL (ref 0.0–0.1)
Basophils Relative: 1 %
EOS PCT: 1 %
Eosinophils Absolute: 0.1 10*3/uL (ref 0.0–0.5)
HCT: 32.3 % — ABNORMAL LOW (ref 36.0–46.0)
Hemoglobin: 10.9 g/dL — ABNORMAL LOW (ref 12.0–15.0)
Immature Granulocytes: 16 %
Lymphocytes Relative: 14 %
Lymphs Abs: 1.6 10*3/uL (ref 0.7–4.0)
MCH: 30.8 pg (ref 26.0–34.0)
MCHC: 33.7 g/dL (ref 30.0–36.0)
MCV: 91.2 fL (ref 80.0–100.0)
Monocytes Absolute: 1.1 10*3/uL — ABNORMAL HIGH (ref 0.1–1.0)
Monocytes Relative: 10 %
Neutro Abs: 7 10*3/uL (ref 1.7–7.7)
Neutrophils Relative %: 58 %
Platelet Count: 341 10*3/uL (ref 150–400)
RBC: 3.54 MIL/uL — ABNORMAL LOW (ref 3.87–5.11)
RDW: 13.5 % (ref 11.5–15.5)
WBC Count: 11.9 10*3/uL — ABNORMAL HIGH (ref 4.0–10.5)
nRBC: 0.8 % — ABNORMAL HIGH (ref 0.0–0.2)

## 2018-07-14 LAB — CMP (CANCER CENTER ONLY)
ALT: 13 U/L (ref 0–44)
AST: 12 U/L — ABNORMAL LOW (ref 15–41)
Albumin: 3.9 g/dL (ref 3.5–5.0)
Alkaline Phosphatase: 116 U/L (ref 38–126)
Anion gap: 10 (ref 5–15)
BUN: 11 mg/dL (ref 6–20)
CO2: 27 mmol/L (ref 22–32)
Calcium: 9.7 mg/dL (ref 8.9–10.3)
Chloride: 105 mmol/L (ref 98–111)
Creatinine: 0.88 mg/dL (ref 0.44–1.00)
GFR, Est AFR Am: >60 mL/min (ref >60)
Glucose, Bld: 87 mg/dL (ref 70–99)
Potassium: 3.2 mmol/L — ABNORMAL LOW (ref 3.5–5.1)
Sodium: 142 mmol/L (ref 135–145)
Total Bilirubin: <0.2 mg/dL — ABNORMAL LOW (ref 0.3–1.2)
Total Protein: 6.7 g/dL (ref 6.5–8.1)

## 2018-07-14 MED ORDER — PEGFILGRASTIM 6 MG/0.6ML ~~LOC~~ PSKT
6.0000 mg | PREFILLED_SYRINGE | Freq: Once | SUBCUTANEOUS | Status: AC
  Administered 2018-07-14: 6 mg via SUBCUTANEOUS

## 2018-07-14 MED ORDER — SODIUM CHLORIDE 0.9% FLUSH
10.0000 mL | Freq: Once | INTRAVENOUS | Status: AC
  Administered 2018-07-14: 10 mL
  Filled 2018-07-14: qty 10

## 2018-07-14 MED ORDER — SODIUM CHLORIDE 0.9% FLUSH
10.0000 mL | INTRAVENOUS | Status: DC | PRN
  Administered 2018-07-14: 10 mL
  Filled 2018-07-14: qty 10

## 2018-07-14 MED ORDER — SODIUM CHLORIDE 0.9 % IV SOLN
Freq: Once | INTRAVENOUS | Status: AC
  Administered 2018-07-14: 16:00:00 via INTRAVENOUS
  Filled 2018-07-14: qty 5

## 2018-07-14 MED ORDER — PALONOSETRON HCL INJECTION 0.25 MG/5ML
INTRAVENOUS | Status: AC
  Filled 2018-07-14: qty 5

## 2018-07-14 MED ORDER — PEGFILGRASTIM 6 MG/0.6ML ~~LOC~~ PSKT
PREFILLED_SYRINGE | SUBCUTANEOUS | Status: AC
  Filled 2018-07-14: qty 0.6

## 2018-07-14 MED ORDER — HEPARIN SOD (PORK) LOCK FLUSH 100 UNIT/ML IV SOLN
500.0000 [IU] | Freq: Once | INTRAVENOUS | Status: AC | PRN
  Administered 2018-07-14: 500 [IU]
  Filled 2018-07-14: qty 5

## 2018-07-14 MED ORDER — DOXORUBICIN HCL CHEMO IV INJECTION 2 MG/ML
60.0000 mg/m2 | Freq: Once | INTRAVENOUS | Status: AC
  Administered 2018-07-14: 102 mg via INTRAVENOUS
  Filled 2018-07-14: qty 51

## 2018-07-14 MED ORDER — PALONOSETRON HCL INJECTION 0.25 MG/5ML
0.2500 mg | Freq: Once | INTRAVENOUS | Status: AC
  Administered 2018-07-14: 0.25 mg via INTRAVENOUS

## 2018-07-14 MED ORDER — SODIUM CHLORIDE 0.9 % IV SOLN
600.0000 mg/m2 | Freq: Once | INTRAVENOUS | Status: AC
  Administered 2018-07-14: 1020 mg via INTRAVENOUS
  Filled 2018-07-14: qty 51

## 2018-07-14 MED ORDER — SODIUM CHLORIDE 0.9 % IV SOLN
Freq: Once | INTRAVENOUS | Status: AC
  Administered 2018-07-14: 15:00:00 via INTRAVENOUS
  Filled 2018-07-14: qty 250

## 2018-07-14 NOTE — Progress Notes (Signed)
Patient Care Team: [name], MD as PCP - General (Family Medicine) Excell Seltzer, MD as Consulting Physician (General Surgery) Nicholas Lose, MD as Consulting Physician (Hematology and Oncology) Kyung Rudd, MD as Consulting Physician (Radiation Oncology)  DIAGNOSIS:  Encounter Diagnosis  Name Primary?  . 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 Ki-67 of 20%, T1BN0 stage Ib clinical stage    06/04/2018 Cancer Staging    Staging form: Breast, AJCC 8th Edition - Clinical stage from 06/04/2018: Stage IB (cT1b, cN0, cM0, G3, ER-, PR-, HER2-) - Signed by Nicholas Lose, MD on 06/04/2018    06/17/2018 -  Neo-Adjuvant Chemotherapy    Dose dense Adriamycin and Cytoxan x4 followed by Taxol and carboplatin     CHIEF COMPLIANT: Cycle 3 dose dense Adriamycin and Cytoxan  INTERVAL HISTORY: [name] is a 58 year old above-mentioned history of right breast cancer is currently on neoadjuvant chemotherapy and today cycle 3 of dose dense Adriamycin and Cytoxan.  She completely lost her hair but otherwise she has not noticed any significant toxicities.  Denies any nausea or vomiting.  She does have mild constipation.  REVIEW OF SYSTEMS:   Constitutional: Denies fevers, chills or abnormal weight loss Eyes: Denies blurriness of vision Ears, nose, mouth, throat, and face: Denies mucositis or sore throat Respiratory: Denies cough, dyspnea or wheezes Cardiovascular: Denies palpitation, chest discomfort Gastrointestinal:   Denies nausea, heartburn or change in bowel habits Skin: Denies abnormal skin rashes Lymphatics: Denies new lymphadenopathy or easy bruising Neurological:Denies numbness, tingling or new weaknesses Behavioral/Psych: Mood is stable, no new changes  Extremities: No lower extremity edema  All other systems were reviewed with the patient and are negative.  I have reviewed the past medical history, past surgical history, social history and family history with the patient and they are unchanged from previous note.  ALLERGIES:  is allergic to lisinopril.  MEDICATIONS:  Current Outpatient Medications  Medication Sig Dispense Refill  . Ascorbic Acid (VITAMIN C) 500 MG CAPS Take 500 mg by mouth daily.     [name] aspirin EC 81 MG EC tablet Take 1 tablet (81 mg total) by mouth daily. 90 tablet 0  . Biotin 10000 MCG TABS Take 10,000 mcg by mouth daily.    . Calcium Carbonate-Vitamin D (CALCIUM 500/D PO) Take by mouth.    . chlorthalidone (HYGROTON) 25 MG tablet Take 1 tablet (25 mg total) by mouth daily. (Patient not taking: Reported on 06/04/2018) 90 tablet 2  . Cyanocobalamin (VITAMIN B 12 PO) Take by mouth.    . dexamethasone (DECADRON) 4 MG tablet Take 1 tablet (4 mg total) by mouth daily. Take 1 tablet day after chemo and 1 tablet 2 days after chemo with food 8 tablet 0  . dicyclomine (BENTYL) 10 MG capsule Take 1 capsule (10 mg total) by mouth 2 (two) times daily as needed for spasms. 30 capsule 1  . Flaxseed, Linseed, (FLAX SEED OIL) 1000 MG CAPS Take 1,000 mg by mouth once a week.    . hydrochlorothiazide (HYDRODIURIL) 25 MG  tablet Take 25 mg by mouth daily.    [name] HYDROcodone-Acetaminophen (LORTAB) 10-300 MG/15ML SOLN Take 5 mLs by mouth every 6 (six) hours as needed. Do not drive, work, or operate machinery while taking this medication (Patient not taking: Reported on 06/04/2018) 100 mL 0  . labetalol (NORMODYNE) 100 MG tablet Take 1 tablet (100 mg total) by mouth 2 (two) times daily. 180 tablet 3  .  lidocaine-prilocaine (EMLA) cream Apply to affected area once 30 g 3  . LORazepam (ATIVAN) 0.5 MG tablet Take 1 tablet (0.5 mg total) by mouth at bedtime as needed (Nausea or vomiting). 30 tablet 0  . mometasone (ELOCON) 0.1 % ointment Apply 1 application topically as needed (dry skin).     . Multiple Vitamin (MULTIVITAMIN) tablet Take 1 tablet by mouth daily.    . ondansetron (ZOFRAN) 8 MG tablet Take 1 tablet (8 mg total) by mouth 2 (two) times daily as needed. Start on the third day after chemotherapy. 30 tablet 1  . prochlorperazine (COMPAZINE) 10 MG tablet Take 1 tablet (10 mg total) by mouth every 6 (six) hours as needed (Nausea or vomiting). 30 tablet 1  . Turmeric 500 MG TABS Take 500 mg by mouth daily.    [name] zinc gluconate 50 MG tablet Take 50 mg by mouth daily.     No current facility-administered medications for this visit.    Facility-Administered Medications Ordered in Other Visits  Medication Dose Route Frequency Provider Last Rate Last Dose  . cyclophosphamide (CYTOXAN) 1,020 mg in sodium chloride 0.9 % 250 mL chemo infusion  600 mg/m2 (Treatment Plan Recorded) Intravenous Once Nicholas Lose, MD      . DOXOrubicin (ADRIAMYCIN) chemo injection 102 mg  60 mg/m2 (Treatment Plan Recorded) Intravenous Once Nicholas Lose, MD      . heparin lock flush 100 unit/mL  500 Units Intracatheter Once PRN Nicholas Lose, MD      . pegfilgrastim (NEULASTA ONPRO KIT) injection 6 mg  6 mg Subcutaneous Once Nicholas Lose, MD      . sodium chloride flush (NS) 0.9 % injection 10 mL  10 mL Intracatheter PRN Nicholas Lose, MD        PHYSICAL EXAMINATION: ECOG PERFORMANCE STATUS: 1 - Symptomatic but completely ambulatory  Vitals:   07/14/18 1415  BP: 119/76  Pulse: 72  Resp: 17  Temp: 98.5 F (36.9 C)  SpO2: 100%   Filed Weights   07/14/18 1415  Weight: 140 lb 14.4 oz (63.9 kg)    GENERAL:alert, no distress and comfortable SKIN: skin color, texture, turgor are normal, no rashes or  significant lesions EYES: normal, Conjunctiva are pink and non-injected, sclera clear OROPHARYNX:no exudate, no erythema and lips, buccal mucosa, and tongue normal  NECK: supple, thyroid normal size, non-tender, without nodularity LYMPH:  no palpable lymphadenopathy in the cervical, axillary or inguinal LUNGS: clear to auscultation and percussion with normal breathing effort HEART: regular rate & rhythm and no murmurs and no lower extremity edema ABDOMEN:abdomen soft, non-tender and normal bowel sounds MUSCULOSKELETAL:no cyanosis of digits and no clubbing  NEURO: alert & oriented x 3 with fluent speech, no focal motor/sensory deficits EXTREMITIES: No lower extremity edema   LABORATORY DATA:  I have reviewed the data as listed CMP Latest Ref Rng & Units 07/14/2018 07/01/2018 06/24/2018  Glucose 70 - 99 mg/dL 87 102(H) 104(H)  BUN 6 - 20 mg/dL _0 Creatinine 0.44 - 1.00 mg/dL 0.88 1.01(H) 0.96  Sodium 135 - 145 mmol/L 142 142 137  Potassium 3.5 - 5.1 mmol/L 3.2(L) 3.1(L) 3.1(L)  Chloride 98 - 111 mmol/L 105 104 98  CO2 22 - 32 mmol/L _0 Calcium 8.9 - 10.3 mg/dL 9.7 8.7(L) 9.5  Total Protein 6.5 - 8.1 g/dL 6.7 6.5 6.9  Total Bilirubin 0.3 - 1.2 mg/dL <0.2(L) 0.2(L) 0.4  Alkaline Phos 38 - 126 U/L 116 97 103  AST 15 - 41 U/L 12(L) 14(L) 14(L)  ALT 0 - 44 U/L _1 Lab Results  Component Value Date   WBC 11.9 (H) 07/14/2018   HGB 10.9 (L) 07/14/2018   HCT 32.3 (L) 07/14/2018   MCV 91.2 07/14/2018   PLT 341 07/14/2018   NEUTROABS 7.0 07/14/2018    ASSESSMENT & PLAN:  Malignant neoplasm of upper-outer quadrant of right breast in female, estrogen receptor negative ([facility]) 05/29/2018:Right axillary pain which led to a mammogram and ultrasound that revealed a new mass in the superior UOQ tail of the breast 10 o'clock position by ultrasound measured 8 mm, axilla negative, biopsy revealed 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 Neoadjuvant chemotherapy with Adriamycin and Cytoxan dose dense 4 followed byTaxolweekly 12and carboplatin every 3 weeks 2. Followed by breast conserving surgery with sentinel lymph node study  3. Followed by adjuvant radiation therapy Genetics consultation is happening this week Echocardiogram 06/10/2018: EF greater than 65% Breast MRI 06/09/2018: 1.1 cm enhancing mass UOQ right breast ---------------------------------------------------------------------------------------------------------------------------------- Current treatment: Cycle 3 day1 dose dense Adriamycin and Cytoxan Chemo toxicities: 1.Abdominal cramps lasted 3 days: Gave Bentyl prescription 2.Mild nausea 3.Fatigue lasted 3 days 4.  Alopecia 5.  Chemotherapy-induced anemia hemoglobin 10.9 monitoring closely  Labs have been reviewed  Return to clinic in 3 weeks for cycle 4      No orders of the defined types were placed in this encounter.  The patient has a good understanding of the overall plan. she agrees with it. she will call with any problems that may develop before the next visit here.   Harriette Ohara, MD 07/14/18

## 2018-07-14 NOTE — Patient Instructions (Signed)
[facility] Discharge Instructions for Patients Receiving Chemotherapy  Today you received the following chemotherapy agents Adriamycin and Cytoxan  To help prevent nausea and vomiting after your treatment, we encourage you to take your nausea medication as directed.  If you develop nausea and vomiting that is not controlled by your nausea medication, call the clinic.   BELOW ARE SYMPTOMS THAT SHOULD BE REPORTED IMMEDIATELY:  *FEVER GREATER THAN 100.5 F  *CHILLS WITH OR WITHOUT FEVER  NAUSEA AND VOMITING THAT IS NOT CONTROLLED WITH YOUR NAUSEA MEDICATION  *UNUSUAL SHORTNESS OF BREATH  *UNUSUAL BRUISING OR BLEEDING  TENDERNESS IN MOUTH AND THROAT WITH OR WITHOUT PRESENCE OF ULCERS  *URINARY PROBLEMS  *BOWEL PROBLEMS  UNUSUAL RASH Items with * indicate a potential emergency and should be followed up as soon as possible.  Feel free to call the clinic should you have any questions or concerns. The clinic phone number is [phone].  Please show the CHEMO ALERT CARD at check-in to the Emergency Department and triage nurse.   

## 2018-07-15 ENCOUNTER — Encounter: Payer: Self-pay | Admitting: Hematology and Oncology

## 2018-07-16 ENCOUNTER — Telehealth: Payer: Self-pay | Admitting: Licensed Clinical Social Worker

## 2018-07-16 ENCOUNTER — Ambulatory Visit: Payer: Self-pay | Admitting: Licensed Clinical Social Worker

## 2018-07-16 ENCOUNTER — Encounter: Payer: Self-pay | Admitting: Licensed Clinical Social Worker

## 2018-07-16 DIAGNOSIS — Z1379 Encounter for other screening for genetic and chromosomal anomalies: Secondary | ICD-10-CM

## 2018-07-16 NOTE — Telephone Encounter (Signed)
Revealed negative genetic testing. This normal result is reassuring and indicates that it is unlikely Katrina Liu's cancer is due to a hereditary cause.  It is unlikely that there is an increased risk of another cancer due to a mutation in one of these genes.  However, genetic testing is not perfect, and cannot definitively rule out a hereditary cause.  It will be important for her to keep in contact with genetics to learn if any additional testing may be needed in the future.

## 2018-07-16 NOTE — Progress Notes (Addendum)
HPI:  Katrina Liu was previously seen in the [place] clinic due to a recent diagnosis of triple negative breast cancer and family history of cancer and concerns regarding a hereditary predisposition to cancer. Please refer to our prior cancer genetics clinic note for more information regarding our discussion, assessment and recommendations, at the time. Katrina Liu recent genetic test results were disclosed to her, as were recommendations warranted by these results. These results and recommendations are discussed in more detail below.  In 2020, at the age of 58, Katrina Liu was diagnosed with triple negative invasive ductal carcinoma of the right breast.  She is currently undergoing neoadjuvant chemotherapy. She is planning breast conserving surgery and adjuvant radiation.   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 The CancerNext gene panel offered by [employer] includes sequencing and rearrangement analysis for the following 34 genes:   APC, ATM, BARD1, BMPR1A, BRCA1, BRCA2, BRIP1, CDH1, CDK4, CDKN2A, CHEK2, DICER1, EPCAM, GREM1, HOXB13MLH1, MRE11A, MSH2,  MSH6, MUTYH, NBN, NF1, PALB2, PMS2, POLD1, POLE, PTEN, RAD50, RAD51D, RAD51C, SMAD4, SMARCA4, STK11, and TP53. The report date is 07/15/2018.     FAMILY HISTORY:  We obtained a detailed, 4-generation family history.  Significant diagnoses are listed below: Family History  Problem Relation Age of Onset  . Stroke Father 15  . Lung cancer Father   . Prostate cancer Paternal Uncle        dx 69's?  . Diabetes Maternal Grandfather   . Breast cancer Neg Hx    Katrina Liu has a 47 year-old daughter with no hx of cancer. She has a brother and a sister in their 80's with no hx of cancer.   Katrina Liu father: deceased, lung cancer Paternal aunts/Uncles: 2 paternal uncles, 44 had prostate cancer Paternal cousins: no known hx of cancer Paternal grandfather: died with a heart attack Paternal grandmother:no hx of cancer  Katrina Liu's mother: 65, no hx of cancer.  She had a complete hysterectomy Maternal Aunts/Uncles: 2 maternal aunts and 2 maternal uncles, no known hx of cancer.  Maternal cousins: no known hx of cancer. A 4th cousin had breast cancer.  Maternal grandfather: diabetes, deceased Maternal grandmother:died young, no known hx of cancer.   Katrina Liu is unaware of previous family history of genetic testing for hereditary cancer risks. Patient's maternal ancestors are of Black descent, and paternal ancestors are of Black descent. There is no reported Ashkenazi Jewish ancestry. There is no known consanguinity.  GENETIC TEST RESULTS: Genetic testing reported out on 07/15/2018 through the CancerNext+ RNAinsight cancer panel found no pathogenic mutations.   The CancerNext gene panel offered by  [facility] includes sequencing and rearrangement analysis for the following 34 genes:   APC, ATM, BARD1, BMPR1A, BRCA1, BRCA2, BRIP1, CDH1, CDK4, CDKN2A, CHEK2, DICER1, EPCAM, GREM1, HOXB13MLH1, MRE11A, MSH2, MSH6, MUTYH, NBN, NF1, PALB2, PMS2, POLD1, POLE, PTEN, RAD50, RAD51C, RAD51D, SMAD4,  SMARCA4, STK11, and TP53.   The test report has been scanned into EPIC and is located under the Molecular Pathology section of the Results Review tab.  A portion of the result report is included below for reference.     We discussed with Katrina Liu that because current genetic testing is not perfect, it is possible there may be a gene mutation in one of these genes that current testing cannot detect, but that chance is small.  We also discussed, that there could be another gene that has not yet been discovered, or that we have not yet tested, that is responsible for the cancer diagnoses in the family. It is also possible there is a hereditary cause for the cancer in the family that Katrina Liu did not inherit and therefore was not identified in her testing.  Therefore, it is important to remain in touch with cancer genetics in the future so that we can continue to offer Katrina Liu the most up to date genetic testing.   ADDITIONAL GENETIC TESTING: We discussed with Katrina Liu that her genetic testing was fairly extensive.  If there are genes identified to increase cancer risk that can be analyzed in the future, we would be happy to discuss and coordinate this testing at that time.    CANCER SCREENING RECOMMENDATIONS: Katrina Liu test result is considered negative (normal).  This means that we have not identified a hereditary cause for her personal and family history of cancer at this time. Most cancers happen by chance and this negative test suggests that her cancer may fall into this category.    While reassuring, this does not definitively rule out a hereditary predisposition to cancer. It is still possible that there could be genetic mutations that are undetectable by current technology. There could be genetic mutations in genes that have not been tested or identified to increase cancer risk.  Therefore, it is recommended she continue to follow the cancer management and screening guidelines provided by  her oncology and primary healthcare provider.   An individual's cancer risk and medical management are not determined by genetic test results alone. Overall cancer risk assessment incorporates additional factors, including personal medical history, family history, and any available genetic information that may result in a personalized plan for cancer prevention and surveillance  RECOMMENDATIONS FOR FAMILY MEMBERS:  Women in this family might be at some increased risk of developing cancer, over the general population risk, simply due to the family history of cancer.  We recommended women in this family have a yearly mammogram beginning at age 75, or 42 years younger than the earliest onset of cancer, an annual clinical breast exam, and perform monthly breast self-exams. Women in this family should also have a gynecological exam as recommended by their primary provider. All family members should have a colonoscopy by age 7.  FOLLOW-UP: Lastly, we discussed with Katrina Liu that cancer genetics is a rapidly advancing field and it is possible that new genetic tests will be appropriate for her and/or her family members in the future. We encouraged her to remain in contact with cancer genetics on an annual basis so we can update her personal and family histories and let her know of advances in cancer  genetics that may benefit this family.   Our contact number was provided. Katrina Liu questions were answered to her satisfaction, and she knows she is welcome to call us at anytime with additional questions or concerns.   Faith Rogue, MS Genetic Counselor [email or web address] Phone: [phone]

## 2018-07-20 ENCOUNTER — Encounter: Payer: Self-pay | Admitting: Hematology and Oncology

## 2018-07-21 ENCOUNTER — Inpatient Hospital Stay (HOSPITAL_BASED_OUTPATIENT_CLINIC_OR_DEPARTMENT_OTHER): Payer: Managed Care, Other (non HMO) | Admitting: Medical

## 2018-07-21 ENCOUNTER — Inpatient Hospital Stay: Payer: Managed Care, Other (non HMO)

## 2018-07-21 ENCOUNTER — Telehealth: Payer: Self-pay | Admitting: *Deleted

## 2018-07-21 ENCOUNTER — Other Ambulatory Visit: Payer: Self-pay

## 2018-07-21 VITALS — BP 115/69 | HR 92 | Temp 98.0°F | Resp 18 | Ht 65.0 in | Wt 142.4 lb

## 2018-07-21 DIAGNOSIS — Z171 Estrogen receptor negative status [ER-]: Secondary | ICD-10-CM | POA: Diagnosis not present

## 2018-07-21 DIAGNOSIS — Z5111 Encounter for antineoplastic chemotherapy: Secondary | ICD-10-CM | POA: Diagnosis not present

## 2018-07-21 DIAGNOSIS — R42 Dizziness and giddiness: Secondary | ICD-10-CM | POA: Diagnosis not present

## 2018-07-21 DIAGNOSIS — C50411 Malignant neoplasm of upper-outer quadrant of right female breast: Secondary | ICD-10-CM

## 2018-07-21 DIAGNOSIS — Z95828 Presence of other vascular implants and grafts: Secondary | ICD-10-CM

## 2018-07-21 DIAGNOSIS — E876 Hypokalemia: Secondary | ICD-10-CM

## 2018-07-21 LAB — CBC WITH DIFFERENTIAL (CANCER CENTER ONLY)
Abs Immature Granulocytes: 0 10*3/uL (ref 0.00–0.07)
Band Neutrophils: 4 %
Basophils Absolute: 0.1 10*3/uL (ref 0.0–0.1)
Basophils Relative: 2 %
EOS PCT: 4 %
Eosinophils Absolute: 0.1 10*3/uL (ref 0.0–0.5)
HEMATOCRIT: 29.9 % — AB (ref 36.0–46.0)
Hemoglobin: 10.1 g/dL — ABNORMAL LOW (ref 12.0–15.0)
Lymphocytes Relative: 59 %
Lymphs Abs: 1.5 10*3/uL (ref 0.7–4.0)
MCH: 30.6 pg (ref 26.0–34.0)
MCHC: 33.8 g/dL (ref 30.0–36.0)
MCV: 90.6 fL (ref 80.0–100.0)
Monocytes Absolute: 0.3 10*3/uL (ref 0.1–1.0)
Monocytes Relative: 10 %
Neutro Abs: 0.6 10*3/uL — ABNORMAL LOW (ref 1.7–17.7)
Neutrophils Relative %: 21 %
Platelet Count: 140 10*3/uL — ABNORMAL LOW (ref 150–400)
RBC: 3.3 MIL/uL — ABNORMAL LOW (ref 3.87–5.11)
RDW: 13.4 % (ref 11.5–15.5)
WBC: 2.5 10*3/uL — AB (ref 4.0–10.5)
nRBC: 0 % (ref 0.0–0.2)

## 2018-07-21 LAB — CMP (CANCER CENTER ONLY)
ALT: 12 U/L (ref 0–44)
AST: 13 U/L — ABNORMAL LOW (ref 15–41)
Albumin: 3.7 g/dL (ref 3.5–5.0)
Alkaline Phosphatase: 175 U/L — ABNORMAL HIGH (ref 38–126)
Anion gap: 10 (ref 5–15)
BUN: 17 mg/dL (ref 6–20)
CO2: 27 mmol/L (ref 22–32)
Calcium: 9.3 mg/dL (ref 8.9–10.3)
Chloride: 102 mmol/L (ref 98–111)
Creatinine: 0.83 mg/dL (ref 0.44–1.00)
GFR, Est AFR Am: >60 mL/min (ref >60)
Glucose, Bld: 102 mg/dL — ABNORMAL HIGH (ref 70–99)
Potassium: 3.1 mmol/L — ABNORMAL LOW (ref 3.5–5.1)
Sodium: 139 mmol/L (ref 135–145)
Total Bilirubin: 0.6 mg/dL (ref 0.3–1.2)
Total Protein: 6.4 g/dL — ABNORMAL LOW (ref 6.5–8.1)

## 2018-07-21 MED ORDER — SODIUM CHLORIDE 0.9% FLUSH
10.0000 mL | Freq: Once | INTRAVENOUS | Status: AC
  Administered 2018-07-21: 10 mL
  Filled 2018-07-21: qty 10

## 2018-07-21 MED ORDER — HEPARIN SOD (PORK) LOCK FLUSH 100 UNIT/ML IV SOLN
500.0000 [IU] | Freq: Once | INTRAVENOUS | Status: AC
  Administered 2018-07-21: 500 [IU]
  Filled 2018-07-21: qty 5

## 2018-07-21 MED ORDER — POTASSIUM CHLORIDE CRYS ER 20 MEQ PO TBCR: 20.0000 meq | EXTENDED_RELEASE_TABLET | Freq: Every day | ORAL | 2 refills | Status: DC

## 2018-07-21 NOTE — Progress Notes (Signed)
[facility] Progress Note   Katrina Liu [id number] 12-30-1960 58 y.o.  Katrina Liu is managed by Dr. Nicholas Lose  Actively treated with chemotherapy/immunotherapy/hormonal therapy: yes  Current therapy: Adriamycin and Cytoxan  Last treated: 07/14/2018 (cycle 3)  Next scheduled appointment with provider: 07/29/2018  Assessment: Plan:    Port-A-Cath in place - Plan: heparin lock flush 100 unit/mL, sodium chloride flush (NS) 0.9 % injection 10 mL  Malignant neoplasm of upper-outer quadrant of right breast in female, estrogen receptor negative ([place]) - Plan: heparin lock flush 100 unit/mL, sodium chloride flush (NS) 0.9 % injection 10 mL  Hypokalemia - Plan: potassium chloride SA (K-DUR,KLOR-CON) 20 MEQ tablet  Dizziness   ER negative malignant neoplasm of the right breast: The patient continues to be followed by Dr. Nicholas Lose and is status post cycle 2 of Adriamycin and Cytoxan which was dosed on 07/14/2018.  She will be seen in follow-up on 07/29/2018.  Hypokalemia: The patient's labs returned today with a potassium of 3.1.  A prescription for potassium chloride 20 mEq once daily was sent to the patient's pharmacy.  She was instructed to take 2 today and Tuesday and 1 daily thereafter.  She will have her potassium rechecked on her return.  Dizziness: The patient's labs returned not showing finding suggestive of dehydration. She was told to continue poushing fluid and monitoring her blood pressure.   Please see After Visit Summary for patient specific instructions.  Future Appointments  Date Time Provider [place]  07/29/2018  1:30 PM [facility] LAB 2 [facility] None  07/29/2018  1:45 PM [facility] [place] FLUSH [facility] None  07/29/2018  2:15 PM Nicholas Lose, MD [facility] None  07/29/2018  2:30 PM [facility] INFUSION [facility] None  08/12/2018  9:00 AM [facility] LAB 4 [facility] None  08/12/2018  9:15 AM [facility] [place] FLUSH [facility] None   08/12/2018  9:45 AM Nicholas Lose, MD [facility] None  08/12/2018 10:45 AM [facility] INFUSION [facility] None  08/19/2018  8:15 AM [facility] LAB 3 [facility] None  08/19/2018  8:30 AM [facility] [place] FLUSH [facility] None  08/19/2018  9:00 AM Nicholas Lose, MD [facility] None  08/19/2018 10:15 AM [facility] INFUSION [facility] None  08/26/2018 12:00 PM [facility] LAB 4 [facility] None  08/26/2018 12:15 PM [facility] [place] FLUSH [facility] None  08/26/2018  1:30 PM [facility] INFUSION [facility] None    No orders of the defined types were placed in this encounter.      Subjective:   Patient ID:  Katrina Liu is a 58 y.o. (DOB [birth year]) female.  Chief Complaint:  Chief Complaint  Patient presents with  . Dizziness    HPI Katrina Liu is a 58 year old female with a diagnosis of an ER negative malignant neoplasm of the right breast who is managed by Dr. Lupe Carney and is status post cycle 3 of Adriamycin and Cytoxan which was dosed on 07/14/2018.  She presents to the clinic today stating that she was having dizziness, fatigue, and headaches this weekend.  She reports that she is feeling better today and states that she is eating and drinking well.  She is agreeable to wait until her labs are returned before making decision regarding IV fluids.  She denies fevers, chills, sweats, cough, sinus pressure, sinus pain, nausea, vomiting, constipation, or diarrhea.  Her labs did return showing a potassium of 3.1.  She will be started on potassium chloride supplements.  Her labs otherwise did not indicate that she was dehydrated.  Medications: I have  reviewed the patient's current medications.  Allergies:  Allergies  Allergen Reactions  . Lisinopril Swelling    Past Medical History:  Diagnosis Date  . Anemia   . Family history of prostate cancer   . GERD (gastroesophageal reflux disease)    h/o - not now  . Headache(784.0)   . Hypertension   . S/P abdominal hysterectomy  04/17/2011  . Stroke [facility])     Past Surgical History:  Procedure Laterality Date  . ABDOMINAL HYSTERECTOMY  04/16/2011   Procedure: HYSTERECTOMY ABDOMINAL;  Surgeon: Marvene Staff, MD;  Location: [place] [facility];  Service: Gynecology;  Laterality: N/A;  . BREAST BIOPSY Left 2013  . KIDNEY STONE SURGERY    . LAPAROTOMY     ectopic pregnancy  . LOOP RECORDER IMPLANT N/A 12/24/2012   Procedure: LOOP RECORDER IMPLANT;  Surgeon: Deboraha Sprang, MD;  Location: [facility] C[facility] LAB;  Service: Cardiovascular;  Laterality: N/A;  . LOOP RECORDER REMOVAL N/A 08/15/2016   Procedure: Loop Recorder Removal;  Surgeon: Deboraha Sprang, MD;  Location: [place] CV LAB;  Service: Cardiovascular;  Laterality: N/A;  . SHOULDER ARTHROSCOPY    . TEE WITHOUT CARDIOVERSION N/A 12/24/2012   Procedure: TRANSESOPHAGEAL ECHOCARDIOGRAM (TEE);  Surgeon: Jettie Booze, MD;  Location: [facility] ENDOSCOPY;  Service: Cardiovascular;  Laterality: N/A;    Family History  Problem Relation Age of Onset  . Stroke Father 17  . Lung cancer Father   . Prostate cancer Paternal Uncle        dx 69's?  . Diabetes Maternal Grandfather   . Breast cancer Neg Hx     Social History   Socioeconomic History  . Marital status: Divorced    Spouse name: Not on file  . Number of children: 1  . Years of education: 70  . Highest education level: Not on file  Occupational History  . Occupation: [employer]    Employer: [employer]  Social Needs  . Financial resource strain: Not on file  . Food insecurity:    Worry: Not on file    Inability: Not on file  . Transportation needs:    Medical: Not on file    Non-medical: Not on file  Tobacco Use  . Smoking status: Never Smoker  . Smokeless tobacco: Never Used  Substance and Sexual Activity  . Alcohol use: No    Alcohol/week: 0.0 standard drinks  . Drug use: No  . Sexual activity: Not on file  Lifestyle  . Physical activity:    Days per week: Not on file    Minutes per  session: Not on file  . Stress: Not on file  Relationships  . Social connections:    Talks on phone: Not on file    Gets together: Not on file    Attends religious service: Not on file    Active member of club or organization: Not on file    Attends meetings of clubs or organizations: Not on file    Relationship status: Not on file  . Intimate partner violence:    Fear of current or ex partner: Not on file    Emotionally abused: Not on file    Physically abused: Not on file    Forced sexual activity: Not on file  Other Topics Concern  . Not on file  Social History Narrative   Patient is single with one child.   Patient is left handed.   Patient has hs education.   Patient drinks 1/2 cup  daily.    Past Medical History, Surgical history, Social history, and Family history were reviewed and updated as appropriate.   Please see review of systems for further details on the patient's review from today.   Review of Systems:  Review of Systems  Constitutional: Positive for fatigue. Negative for chills, diaphoresis and fever.  HENT: Negative for trouble swallowing and voice change.   Respiratory: Negative for cough, chest tightness, shortness of breath and wheezing.   Cardiovascular: Negative for chest pain and palpitations.  Gastrointestinal: Negative for abdominal pain, constipation, diarrhea, nausea and vomiting.  Musculoskeletal: Negative for back pain and myalgias.  Neurological: Positive for dizziness and headaches. Negative for light-headedness.    Objective:   Physical Exam:  BP 115/69 (BP Location: Left Arm, Patient Position: Sitting)   Pulse 92   Temp 98 F (36.7 C) (Oral)   Resp 18   Ht 5\' 5"  (1.651 m)   Wt 142 lb 6.4 oz (64.6 kg)   LMP 04/16/2011   SpO2 100%   BMI 23.70 kg/m  ECOG: 1  Physical Exam Constitutional:      General: She is not in acute distress.    Appearance: She is not diaphoretic.  HENT:     Head: Normocephalic and atraumatic.   Cardiovascular:     Rate and Rhythm: Normal rate and regular rhythm.     Heart sounds: Normal heart sounds. No murmur. No friction rub. No gallop.   Pulmonary:     Effort: Pulmonary effort is normal. No respiratory distress.     Breath sounds: Normal breath sounds. No wheezing or rales.  Skin:    General: Skin is warm and dry.     Findings: No erythema or rash.  Neurological:     Mental Status: She is alert.     Coordination: Coordination normal.     Gait: Gait normal.     Lab Review:     Component Value Date/Time   NA 139 07/21/2018 1127   K 3.1 (L) 07/21/2018 1127   CL 102 07/21/2018 1127   CO2 27 07/21/2018 1127   GLUCOSE 102 (H) 07/21/2018 1127   BUN 17 07/21/2018 1127   CREATININE 0.83 07/21/2018 1127   CALCIUM 9.3 07/21/2018 1127   PROT 6.4 (L) 07/21/2018 1127   ALBUMIN 3.7 07/21/2018 1127   AST 13 (L) 07/21/2018 1127   ALT 12 07/21/2018 1127   ALKPHOS 175 (H) 07/21/2018 1127   BILITOT 0.6 07/21/2018 1127   GFRNONAA >60 07/21/2018 1127   GFRAA >60 07/21/2018 1127       Component Value Date/Time   WBC 2.5 (L) 07/21/2018 1127   WBC 6.0 12/22/2012 1005   RBC 3.30 (L) 07/21/2018 1127   HGB 10.1 (L) 07/21/2018 1127   HCT 29.9 (L) 07/21/2018 1127   PLT 140 (L) 07/21/2018 1127   MCV 90.6 07/21/2018 1127   MCH 30.6 07/21/2018 1127   MCHC 33.8 07/21/2018 1127   RDW 13.4 07/21/2018 1127   LYMPHSABS 1.5 07/21/2018 1127   MONOABS 0.3 07/21/2018 1127   EOSABS 0.1 07/21/2018 1127   BASOSABS 0.1 07/21/2018 1127   -------------------------------  Imaging from last 24 hours (if applicable):  Radiology interpretation: No results found.

## 2018-07-21 NOTE — Patient Instructions (Signed)

## 2018-07-21 NOTE — Progress Notes (Signed)
Pt presents today for fatigue, weakness, headaches and dizziness over the weekend.  Reports feeling better today but wanted to get checked out just in case.  Denies CP or SOB or fever/chills or N/V/D.  Denies recent travel or cough/resp symptoms.  Afebrile.  Reports eating and drinking normally, says that increasing her fluids and food last night and this am really made her feel better.  A&Ox4.

## 2018-07-21 NOTE — Telephone Encounter (Signed)
Received mychart message from pt over the weekend stating she has been experiencing dizziness and her BP was 86/68.  I called pt this morning to follow up to see how she was feeling. Pt states she is still experiencing dizziness/light headed, fatigue and her blood pressure continues to be low. Instructed to the pt that I would send a message to scheduling to schedule the pt to see Sandi Mealy, PA today for dehydration.  Pt stated understanding.

## 2018-07-28 NOTE — Progress Notes (Signed)
Patient Care Team: [name], MD as PCP - General (Family Medicine) Excell Seltzer, MD as Consulting Physician (General Surgery) Nicholas Lose, MD as Consulting Physician (Hematology and Oncology) Kyung Rudd, MD as Consulting Physician (Radiation Oncology)  DIAGNOSIS:    ICD-10-CM   1. 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 The CancerNext gene panel offered by [employer] includes sequencing and rearrangement analysis for the following 34 genes:   APC, ATM, BARD1, BMPR1A, BRCA1, BRCA2, BRIP1, CDH1, CDK4, CDKN2A, CHEK2, DICER1, EPCAM, GREM1, HOXB13MLH1, MRE11A, MSH2, MSH6, MUTYH, NBN, NF1, PALB2, PMS2, POLD1, POLE, PTEN, RAD50, RAD51D, RAD51C, SMAD4, SMARCA4, STK11, and TP53. The report date is 07/15/2018.     CHIEF COMPLIANT: Cycle 4 Adriamycin and Cytoxan  INTERVAL HISTORY: Katrina Liu is a 58 y.o. with above-mentioned history of right breast cancer who is currently on neoadjuvant chemotherapy with dose dense  Adriamycin and Cytoxan. Recent genetic testing came back negative. She presents to the clinic alone today for cycle 4.   REVIEW OF SYSTEMS:   Constitutional: Denies fevers, chills or abnormal weight loss Eyes: Denies blurriness of vision Ears, nose, mouth, throat, and face: Denies mucositis or sore throat Respiratory: Denies cough, dyspnea or wheezes Cardiovascular: Denies palpitation, chest discomfort Gastrointestinal: Denies nausea, heartburn or change in bowel habits Skin: Denies abnormal skin rashes Lymphatics: Denies new lymphadenopathy or easy bruising Neurological: Denies numbness, tingling or new weaknesses Behavioral/Psych: Mood is stable, no new changes  Extremities: No lower extremity edema Breast: denies any pain or lumps or nodules in either breasts All other systems were reviewed with the patient and are negative.  I have reviewed the past medical history, past surgical history, social history and family history with the patient and they are unchanged from previous note.  ALLERGIES:  is allergic to lisinopril.  MEDICATIONS:  Current Outpatient Medications  Medication Sig Dispense Refill  . Ascorbic Acid (VITAMIN C) 500 MG CAPS Take 500 mg by mouth daily.     [name] aspirin EC 81 MG EC tablet Take 1 tablet (81 mg total) by mouth daily. 90 tablet 0  . Biotin 10000 MCG TABS Take 10,000 mcg by mouth daily.    . Calcium Carbonate-Vitamin D (CALCIUM 500/D PO) Take by mouth.    . chlorthalidone (HYGROTON) 25 MG tablet Take 1 tablet (25 mg total) by mouth daily. (Patient not taking: Reported on 06/04/2018) 90 tablet 2  . Cyanocobalamin (VITAMIN B 12 PO) Take by mouth.    . dexamethasone (DECADRON) 4 MG  tablet Take 1 tablet (4 mg total) by mouth daily. Take 1 tablet day after chemo and 1 tablet 2 days after chemo with food (Patient not taking: Reported on 07/21/2018) 8 tablet 0  . dicyclomine (BENTYL) 10 MG capsule Take 1 capsule (10 mg total) by mouth 2 (two) times daily as needed for  spasms. (Patient not taking: Reported on 07/21/2018) 30 capsule 1  . Flaxseed, Linseed, (FLAX SEED OIL) 1000 MG CAPS Take 1,000 mg by mouth once a week.    . hydrochlorothiazide (HYDRODIURIL) 25 MG tablet Take 25 mg by mouth daily.    [name] HYDROcodone-Acetaminophen (LORTAB) 10-300 MG/15ML SOLN Take 5 mLs by mouth every 6 (six) hours as needed. Do not drive, work, or operate machinery while taking this medication (Patient not taking: Reported on 06/04/2018) 100 mL 0  . labetalol (NORMODYNE) 100 MG tablet Take 1 tablet (100 mg total) by mouth 2 (two) times daily. 180 tablet 3  . lidocaine-prilocaine (EMLA) cream Apply to affected area once 30 g 3  . LORazepam (ATIVAN) 0.5 MG tablet Take 1 tablet (0.5 mg total) by mouth at bedtime as needed (Nausea or vomiting). (Patient not taking: Reported on 07/21/2018) 30 tablet 0  . mometasone (ELOCON) 0.1 % ointment Apply 1 application topically as needed (dry skin).     . Multiple Vitamin (MULTIVITAMIN) tablet Take 1 tablet by mouth daily.    . ondansetron (ZOFRAN) 8 MG tablet Take 1 tablet (8 mg total) by mouth 2 (two) times daily as needed. Start on the third day after chemotherapy. 30 tablet 1  . potassium chloride SA (K-DUR,KLOR-CON) 20 MEQ tablet Take 1 tablet (20 mEq total) by mouth daily. 30 tablet 2  . prochlorperazine (COMPAZINE) 10 MG tablet Take 1 tablet (10 mg total) by mouth every 6 (six) hours as needed (Nausea or vomiting). 30 tablet 1  . Turmeric 500 MG TABS Take 500 mg by mouth daily.    [name] zinc gluconate 50 MG tablet Take 50 mg by mouth daily.     No current facility-administered medications for this visit.     PHYSICAL EXAMINATION: ECOG PERFORMANCE STATUS: 1 - Symptomatic but completely ambulatory  Vitals:   07/29/18 1415  BP: (!) 141/89  Pulse: 78  Resp: 17  Temp: 98 F (36.7 C)  SpO2: 100%   Filed Weights   07/29/18 1415  Weight: 148 lb 4.8 oz (67.3 kg)    GENERAL: alert, no distress and comfortable SKIN: skin color, texture,  turgor are normal, no rashes or significant lesions EYES: normal, Conjunctiva are pink and non-injected, sclera clear OROPHARYNX: no exudate, no erythema and lips, buccal mucosa, and tongue normal  NECK: supple, thyroid normal size, non-tender, without nodularity LYMPH: no palpable lymphadenopathy in the cervical, axillary or inguinal LUNGS: clear to auscultation and percussion with normal breathing effort HEART: regular rate & rhythm and no murmurs and no lower extremity edema ABDOMEN: abdomen soft, non-tender and normal bowel sounds MUSCULOSKELETAL: no cyanosis of digits and no clubbing  NEURO: alert & oriented x 3 with fluent speech, no focal motor/sensory deficits EXTREMITIES: No lower extremity edema  LABORATORY DATA:  I have reviewed the data as listed CMP Latest Ref Rng & Units 07/21/2018 07/14/2018 07/01/2018  Glucose 70 - 99 mg/dL 102(H) 87 102(H)  BUN 6 - 20 mg/dL '17 11 13  ' Creatinine 0.44 - 1.00 mg/dL 0.83 0.88 1.01(H)  Sodium 135 - 145 mmol/L 139 142 142  Potassium 3.5 - 5.1 mmol/L 3.1(L) 3.2(L) 3.1(L)  Chloride 98 -  111 mmol/L 102 105 104  CO2 22 - 32 mmol/L '27 27 27  ' Calcium 8.9 - 10.3 mg/dL 9.3 9.7 8.7(L)  Total Protein 6.5 - 8.1 g/dL 6.4(L) 6.7 6.5  Total Bilirubin 0.3 - 1.2 mg/dL 0.6 <0.2(L) 0.2(L)  Alkaline Phos 38 - 126 U/L 175(H) 116 97  AST 15 - 41 U/L 13(L) 12(L) 14(L)  ALT 0 - 44 U/L '12 13 14    ' Lab Results  Component Value Date   WBC 13.8 (H) 07/29/2018   HGB 9.9 (L) 07/29/2018   HCT 30.2 (L) 07/29/2018   MCV 94.1 07/29/2018   PLT 298 07/29/2018   NEUTROABS PENDING 07/29/2018    ASSESSMENT & PLAN:  Malignant neoplasm of upper-outer quadrant of right breast in female, estrogen receptor negative ([facility]) 05/29/2018:Right axillary pain which led to a mammogram and ultrasound that revealed a new mass in the superior UOQ tail of the breast 10 o'clock position by ultrasound measured 8 mm, axilla negative, 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 Neoadjuvant chemotherapy with Adriamycin and Cytoxan dose dense 4 followed byTaxolweekly 12and carboplatin every 3 weeks 2. Followed by breast conserving surgery with sentinel lymph node study  3. Followed by adjuvant radiation therapy Genetics consultation is happening this week Echocardiogram 06/10/2018: EF greater than 65% Breast MRI 06/09/2018: 1.1 cm enhancing mass UOQ right breast ---------------------------------------------------------------------------------------------------------------------------------- Current treatment: Cycle4day1dose dense Adriamycin and Cytoxan Chemo toxicities: 1.Abdominal cramps: Resolved 2.Mild nausea 3.Fatigue lasted 3 days: The fatigue appears to be getting worse with each treatment 4.  Alopecia 5.  Chemotherapy-induced anemia hemoglobin 9.9 monitoring closely 6.  Dehydration: Patient felt very dizzy and had blood pressure was fairly low. 7.  Hypokalemia: On oral potassium replacement Labs have been reviewed  Return to clinic in3weeksfor cycle 1 taxol    No orders of the defined types were placed in this encounter.  The patient has a good understanding of the overall plan. she agrees with it. she will call with any problems that may develop before the next visit here.  Nicholas Lose, MD 07/29/2018  Julious Oka Dorshimer am acting as scribe for Dr. Nicholas Lose.  I have reviewed the above documentation for accuracy and completeness, and I agree with the above.

## 2018-07-29 ENCOUNTER — Other Ambulatory Visit: Payer: Self-pay

## 2018-07-29 ENCOUNTER — Inpatient Hospital Stay (HOSPITAL_BASED_OUTPATIENT_CLINIC_OR_DEPARTMENT_OTHER): Payer: Managed Care, Other (non HMO) | Admitting: Hematology and Oncology

## 2018-07-29 ENCOUNTER — Inpatient Hospital Stay: Payer: Managed Care, Other (non HMO)

## 2018-07-29 DIAGNOSIS — C50411 Malignant neoplasm of upper-outer quadrant of right female breast: Secondary | ICD-10-CM | POA: Diagnosis not present

## 2018-07-29 DIAGNOSIS — Z171 Estrogen receptor negative status [ER-]: Secondary | ICD-10-CM | POA: Diagnosis not present

## 2018-07-29 DIAGNOSIS — D6481 Anemia due to antineoplastic chemotherapy: Secondary | ICD-10-CM | POA: Diagnosis not present

## 2018-07-29 DIAGNOSIS — Z95828 Presence of other vascular implants and grafts: Secondary | ICD-10-CM

## 2018-07-29 DIAGNOSIS — R42 Dizziness and giddiness: Secondary | ICD-10-CM

## 2018-07-29 DIAGNOSIS — E876 Hypokalemia: Secondary | ICD-10-CM | POA: Diagnosis not present

## 2018-07-29 DIAGNOSIS — Z5111 Encounter for antineoplastic chemotherapy: Secondary | ICD-10-CM | POA: Diagnosis not present

## 2018-07-29 DIAGNOSIS — E860 Dehydration: Secondary | ICD-10-CM

## 2018-07-29 LAB — CBC WITH DIFFERENTIAL (CANCER CENTER ONLY)
Abs Immature Granulocytes: 2.53 10*3/uL — ABNORMAL HIGH (ref 0.00–0.07)
Basophils Absolute: 0.1 10*3/uL (ref 0.0–0.1)
Basophils Relative: 1 %
Eosinophils Absolute: 0.2 10*3/uL (ref 0.0–0.5)
Eosinophils Relative: 1 %
HCT: 30.2 % — ABNORMAL LOW (ref 36.0–46.0)
Hemoglobin: 9.9 g/dL — ABNORMAL LOW (ref 12.0–15.0)
Immature Granulocytes: 18 %
Lymphocytes Relative: 9 %
Lymphs Abs: 1.2 10*3/uL (ref 0.7–4.0)
MCH: 30.8 pg (ref 26.0–34.0)
MCHC: 32.8 g/dL (ref 30.0–36.0)
MCV: 94.1 fL (ref 80.0–100.0)
Monocytes Absolute: 1.6 10*3/uL — ABNORMAL HIGH (ref 0.1–1.0)
Monocytes Relative: 12 %
Neutro Abs: 8.2 10*3/uL — ABNORMAL HIGH (ref 1.7–7.7)
Neutrophils Relative %: 59 %
Platelet Count: 298 10*3/uL (ref 150–400)
RBC: 3.21 MIL/uL — AB (ref 3.87–5.11)
RDW: 15.8 % — ABNORMAL HIGH (ref 11.5–15.5)
WBC: 13.8 10*3/uL — AB (ref 4.0–10.5)
nRBC: 1 % — ABNORMAL HIGH (ref 0.0–0.2)

## 2018-07-29 LAB — CMP (CANCER CENTER ONLY)
ALT: 11 U/L (ref 0–44)
AST: 15 U/L (ref 15–41)
Albumin: 3.9 g/dL (ref 3.5–5.0)
Alkaline Phosphatase: 109 U/L (ref 38–126)
Anion gap: 10 (ref 5–15)
BUN: 9 mg/dL (ref 6–20)
CO2: 24 mmol/L (ref 22–32)
Calcium: 9.3 mg/dL (ref 8.9–10.3)
Chloride: 108 mmol/L (ref 98–111)
Creatinine: 0.87 mg/dL (ref 0.44–1.00)
GFR, Est AFR Am: >60 mL/min (ref >60)
Glucose, Bld: 86 mg/dL (ref 70–99)
POTASSIUM: 4 mmol/L (ref 3.5–5.1)
Sodium: 142 mmol/L (ref 135–145)
Total Bilirubin: 0.3 mg/dL (ref 0.3–1.2)
Total Protein: 6.5 g/dL (ref 6.5–8.1)

## 2018-07-29 MED ORDER — PALONOSETRON HCL INJECTION 0.25 MG/5ML
INTRAVENOUS | Status: AC
  Filled 2018-07-29: qty 5

## 2018-07-29 MED ORDER — SODIUM CHLORIDE 0.9% FLUSH
10.0000 mL | INTRAVENOUS | Status: DC | PRN
  Administered 2018-07-29: 10 mL
  Filled 2018-07-29: qty 10

## 2018-07-29 MED ORDER — HEPARIN SOD (PORK) LOCK FLUSH 100 UNIT/ML IV SOLN
500.0000 [IU] | Freq: Once | INTRAVENOUS | Status: AC | PRN
  Administered 2018-07-29: 500 [IU]
  Filled 2018-07-29: qty 5

## 2018-07-29 MED ORDER — PALONOSETRON HCL INJECTION 0.25 MG/5ML
0.2500 mg | Freq: Once | INTRAVENOUS | Status: AC
  Administered 2018-07-29: 0.25 mg via INTRAVENOUS

## 2018-07-29 MED ORDER — SODIUM CHLORIDE 0.9% FLUSH
10.0000 mL | Freq: Once | INTRAVENOUS | Status: AC
  Administered 2018-07-29: 10 mL
  Filled 2018-07-29: qty 10

## 2018-07-29 MED ORDER — SODIUM CHLORIDE 0.9 % IV SOLN
Freq: Once | INTRAVENOUS | Status: AC
  Administered 2018-07-29: 15:00:00 via INTRAVENOUS
  Filled 2018-07-29: qty 250

## 2018-07-29 MED ORDER — SODIUM CHLORIDE 0.9 % IV SOLN
600.0000 mg/m2 | Freq: Once | INTRAVENOUS | Status: AC
  Administered 2018-07-29: 1020 mg via INTRAVENOUS
  Filled 2018-07-29: qty 51

## 2018-07-29 MED ORDER — PEGFILGRASTIM 6 MG/0.6ML ~~LOC~~ PSKT
6.0000 mg | PREFILLED_SYRINGE | Freq: Once | SUBCUTANEOUS | Status: AC
  Administered 2018-07-29: 6 mg via SUBCUTANEOUS

## 2018-07-29 MED ORDER — SODIUM CHLORIDE 0.9 % IV SOLN
Freq: Once | INTRAVENOUS | Status: AC
  Administered 2018-07-29: 16:00:00 via INTRAVENOUS
  Filled 2018-07-29: qty 5

## 2018-07-29 MED ORDER — DOXORUBICIN HCL CHEMO IV INJECTION 2 MG/ML
60.0000 mg/m2 | Freq: Once | INTRAVENOUS | Status: AC
  Administered 2018-07-29: 102 mg via INTRAVENOUS
  Filled 2018-07-29: qty 51

## 2018-07-29 NOTE — Assessment & Plan Note (Signed)
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 Neoadjuvant chemotherapy with Adriamycin and Cytoxan dose dense 4 followed byTaxolweekly 12and carboplatin every 3 weeks 2. Followed by breast conserving surgery with sentinel lymph node study  3. Followed by adjuvant radiation therapy Genetics consultation is happening this week Echocardiogram 06/10/2018: EF greater than 65% Breast MRI 06/09/2018: 1.1 cm enhancing mass UOQ right breast ---------------------------------------------------------------------------------------------------------------------------------- Current treatment: Cycle4day1dose dense Adriamycin and Cytoxan Chemo toxicities: 1.Abdominal cramps lasted 3 days: Gave Bentyl prescription 2.Mild nausea 3.Fatigue lasted 3 days 4.  Alopecia 5.  Chemotherapy-induced anemia hemoglobin 10.9 monitoring closely  Labs have been reviewed  Return to clinic in3weeksfor cycle 1 taxol

## 2018-07-29 NOTE — Patient Instructions (Signed)
[facility] Discharge Instructions for Patients Receiving Chemotherapy  Today you received the following chemotherapy agents Adriamycin and Cytoxan  To help prevent nausea and vomiting after your treatment, we encourage you to take your nausea medication as directed.  If you develop nausea and vomiting that is not controlled by your nausea medication, call the clinic.   BELOW ARE SYMPTOMS THAT SHOULD BE REPORTED IMMEDIATELY:  *FEVER GREATER THAN 100.5 F  *CHILLS WITH OR WITHOUT FEVER  NAUSEA AND VOMITING THAT IS NOT CONTROLLED WITH YOUR NAUSEA MEDICATION  *UNUSUAL SHORTNESS OF BREATH  *UNUSUAL BRUISING OR BLEEDING  TENDERNESS IN MOUTH AND THROAT WITH OR WITHOUT PRESENCE OF ULCERS  *URINARY PROBLEMS  *BOWEL PROBLEMS  UNUSUAL RASH Items with * indicate a potential emergency and should be followed up as soon as possible.  Feel free to call the clinic should you have any questions or concerns. The clinic phone number is [phone].  Please show the CHEMO ALERT CARD at check-in to the Emergency Department and triage nurse.   

## 2018-07-30 ENCOUNTER — Telehealth: Payer: Self-pay | Admitting: Hematology and Oncology

## 2018-07-30 NOTE — Telephone Encounter (Signed)
No 3/31 los °

## 2018-08-05 NOTE — Assessment & Plan Note (Signed)
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 Neoadjuvant chemotherapy with Adriamycin and Cytoxan dose dense 4 followed byTaxolweekly 12and carboplatin every 3 weeks 2. Followed by breast conserving surgery with sentinel lymph node study  3. Followed by adjuvant radiation therapy Genetics consultation is happening this week Echocardiogram 06/10/2018: EF greater than 65% Breast MRI 06/09/2018: 1.1 cm enhancing mass UOQ right breast ---------------------------------------------------------------------------------------------------------------------------------- Current treatment: Completed 4 cycles of dose since he remains on Cytoxan.  Today is cycle 1 of Taxol. Chemo toxicities: 1.Abdominal cramps: Resolved 2.Mild nausea 3.Fatigue lasted 3 days: The fatigue appears to be getting worse with each treatment 4.Alopecia 5.Chemotherapy-induced anemia hemoglobin 9.9 monitoring closely 6.  Dehydration: Patient felt very dizzy and had blood pressure was fairly low. 7.  Hypokalemia: On oral potassium replacement Labs have been reviewed  Return to clinic weekly for Taxol and I will see her with cycle 2 and then every 2 weeks thereafter.

## 2018-08-07 NOTE — Assessment & Plan Note (Signed)
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 Neoadjuvant chemotherapy with Adriamycin and Cytoxan dose dense 4 followed byTaxolweekly 12and carboplatin every 3 weeks 2. Followed by breast conserving surgery with sentinel lymph node study  3. Followed by adjuvant radiation therapy Genetics consultation is happening this week Echocardiogram 06/10/2018: EF greater than 65% Breast MRI 06/09/2018: 1.1 cm enhancing mass UOQ right breast ---------------------------------------------------------------------------------------------------------------------------------- Current treatment: Completed 4 cycles ofdose dense Adriamycin and Cytoxan, today cycle 1 Taxol Chemo toxicities: 1.Abdominal cramps: Resolved 2.Mild nausea 3.Fatigue lasted 3 days: The fatigue appears to be getting worse with each treatment 4.Alopecia 5.Chemotherapy-induced anemia hemoglobin 9.9 monitoring closely 6.  Dehydration: Patient felt very dizzy and had blood pressure was fairly low. 7.  Hypokalemia: On oral potassium replacement Labs have been reviewed  Return to clinic weekly for Taxol and every other week for follow-up with me

## 2018-08-11 NOTE — Progress Notes (Signed)
Patient Care Team: [name], MD as PCP - General (Family Medicine) Excell Seltzer, MD as Consulting Physician (General Surgery) Nicholas Lose, MD as Consulting Physician (Hematology and Oncology) Kyung Rudd, MD as Consulting Physician (Radiation Oncology)  DIAGNOSIS:    ICD-10-CM   1. 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 The CancerNext gene panel offered by [employer] includes sequencing and rearrangement analysis for the following 34 genes:   APC, ATM, BARD1, BMPR1A, BRCA1, BRCA2, BRIP1, CDH1, CDK4, CDKN2A, CHEK2, DICER1, EPCAM, GREM1, HOXB13MLH1, MRE11A, MSH2, MSH6, MUTYH, NBN, NF1, PALB2, PMS2, POLD1, POLE, PTEN, RAD50, RAD51D, RAD51C, SMAD4, SMARCA4, STK11, and TP53. The report date is 07/15/2018.     CHIEF COMPLIANT: Cycle 1 Taxol  INTERVAL HISTORY: Katrina Liu is a 58 y.o. with above-mentioned history of right breast cancer who completed 4 cycles of neoadjuvant chemotherapy with dose dense Adriamycin and  Cytoxan and presents to the clinic today to begin weekly Taxol treatments.  She had tolerated first 4 cycles of Adriamycin and Cytoxan amazingly well.  REVIEW OF SYSTEMS:   Constitutional: Denies fevers, chills or abnormal weight loss Eyes: Denies blurriness of vision Ears, nose, mouth, throat, and face: Denies mucositis or sore throat Respiratory: Denies cough, dyspnea or wheezes Cardiovascular: Denies palpitation, chest discomfort Gastrointestinal: Denies nausea, heartburn or change in bowel habits Skin: Denies abnormal skin rashes Lymphatics: Denies new lymphadenopathy or easy bruising Neurological: Denies numbness, tingling or new weaknesses Behavioral/Psych: Mood is stable, no new changes  Extremities: No lower extremity edema Breast: denies any pain or lumps or nodules in either breasts All other systems were reviewed with the patient and are negative.  I have reviewed the past medical history, past surgical history, social history and family history with the patient and they are unchanged from previous note.  ALLERGIES:  is allergic to lisinopril.  MEDICATIONS:  Current Outpatient Medications  Medication Sig Dispense Refill  . Ascorbic Acid (VITAMIN C) 500 MG CAPS Take 500 mg by mouth daily.     [name] aspirin EC 81 MG EC tablet Take 1 tablet (81 mg total) by mouth daily. 90 tablet 0  . Biotin 10000 MCG TABS Take 10,000 mcg by mouth daily.    . Calcium Carbonate-Vitamin D (CALCIUM 500/D PO) Take by mouth.    . chlorthalidone (HYGROTON) 25 MG tablet Take 1 tablet (25 mg total) by mouth daily. (Patient not taking: Reported on 06/04/2018) 90 tablet 2  . Cyanocobalamin (VITAMIN B 12 PO) Take by mouth.    [name]  dexamethasone (DECADRON) 4 MG tablet Take 1 tablet (4 mg total) by mouth daily. Take 1 tablet day after chemo and 1 tablet 2 days after chemo with food (Patient not taking: Reported on 07/21/2018) 8 tablet 0  . dicyclomine (BENTYL) 10 MG capsule Take 1 capsule (10 mg total) by mouth 2  (two) times daily as needed for spasms. (Patient not taking: Reported on 07/21/2018) 30 capsule 1  . Flaxseed, Linseed, (FLAX SEED OIL) 1000 MG CAPS Take 1,000 mg by mouth once a week.    . hydrochlorothiazide (HYDRODIURIL) 25 MG tablet Take 25 mg by mouth daily.    [name] HYDROcodone-Acetaminophen (LORTAB) 10-300 MG/15ML SOLN Take 5 mLs by mouth every 6 (six) hours as needed. Do not drive, work, or operate machinery while taking this medication (Patient not taking: Reported on 06/04/2018) 100 mL 0  . labetalol (NORMODYNE) 100 MG tablet Take 1 tablet (100 mg total) by mouth 2 (two) times daily. 180 tablet 3  . lidocaine-prilocaine (EMLA) cream Apply to affected area once 30 g 3  . LORazepam (ATIVAN) 0.5 MG tablet Take 1 tablet (0.5 mg total) by mouth at bedtime as needed (Nausea or vomiting). (Patient not taking: Reported on 07/21/2018) 30 tablet 0  . mometasone (ELOCON) 0.1 % ointment Apply 1 application topically as needed (dry skin).     . Multiple Vitamin (MULTIVITAMIN) tablet Take 1 tablet by mouth daily.    . ondansetron (ZOFRAN) 8 MG tablet Take 1 tablet (8 mg total) by mouth 2 (two) times daily as needed. Start on the third day after chemotherapy. 30 tablet 1  . potassium chloride SA (K-DUR,KLOR-CON) 20 MEQ tablet Take 1 tablet (20 mEq total) by mouth daily. 30 tablet 2  . prochlorperazine (COMPAZINE) 10 MG tablet Take 1 tablet (10 mg total) by mouth every 6 (six) hours as needed (Nausea or vomiting). 30 tablet 1  . Turmeric 500 MG TABS Take 500 mg by mouth daily.    [name] zinc gluconate 50 MG tablet Take 50 mg by mouth daily.     No current facility-administered medications for this visit.    Facility-Administered Medications Ordered in Other Visits  Medication Dose Route Frequency Provider Last Rate Last Dose  . CARBOplatin (PARAPLATIN) 580 mg in sodium chloride 0.9 % 250 mL chemo infusion  580 mg Intravenous Once Nicholas Lose, MD      . famotidine (PEPCID) 20 mg in sodium chloride 0.9 % 100 mL  IVPB  20 mg Intravenous Once Nicholas Lose, MD 408 mL/hr at 08/12/18 1127 20 mg at 08/12/18 1127  . fosaprepitant (EMEND) 150 mg, dexamethasone (DECADRON) 12 mg in sodium chloride 0.9 % 145 mL IVPB   Intravenous Once Nicholas Lose, MD      . heparin lock flush 100 unit/mL  500 Units Intracatheter Once PRN Nicholas Lose, MD      . PACLitaxel (TAXOL) 138 mg in sodium chloride 0.9 % 250 mL chemo infusion (</= 35m/m2)  80 mg/m2 (Treatment Plan Recorded) Intravenous Once GNicholas Lose MD      . sodium chloride flush (NS) 0.9 % injection 10 mL  10 mL Intracatheter PRN GNicholas Lose MD        PHYSICAL EXAMINATION: ECOG PERFORMANCE STATUS: 1 - Symptomatic but completely ambulatory  Vitals:   08/12/18 1002  BP: (!) 142/98  Pulse: 74  Resp: 18  Temp: 98.3 F (36.8 C)  SpO2: 100%   Filed Weights   08/12/18 1002  Weight: 145 lb 4.8 oz (65.9 kg)  GENERAL: alert, no distress and comfortable SKIN: skin color, texture, turgor are normal, no rashes or significant lesions EYES: normal, Conjunctiva are pink and non-injected, sclera clear OROPHARYNX: no exudate, no erythema and lips, buccal mucosa, and tongue normal  NECK: supple, thyroid normal size, non-tender, without nodularity LYMPH: no palpable lymphadenopathy in the cervical, axillary or inguinal LUNGS: clear to auscultation and percussion with normal breathing effort HEART: regular rate & rhythm and no murmurs and no lower extremity edema ABDOMEN: abdomen soft, non-tender and normal bowel sounds MUSCULOSKELETAL: no cyanosis of digits and no clubbing  NEURO: alert & oriented x 3 with fluent speech, no focal motor/sensory deficits EXTREMITIES: No lower extremity edema  LABORATORY DATA:  I have reviewed the data as listed CMP Latest Ref Rng & Units 08/12/2018 07/29/2018 07/21/2018  Glucose 70 - 99 mg/dL 88 86 102(H)  BUN 6 - 20 mg/dL '8 9 17  ' Creatinine 0.44 - 1.00 mg/dL 0.84 0.87 0.83  Sodium 135 - 145 mmol/L 142 142 139  Potassium 3.5  - 5.1 mmol/L 4.1 4.0 3.1(L)  Chloride 98 - 111 mmol/L 109 108 102  CO2 22 - 32 mmol/L '23 24 27  ' Calcium 8.9 - 10.3 mg/dL 9.0 9.3 9.3  Total Protein 6.5 - 8.1 g/dL 6.5 6.5 6.4(L)  Total Bilirubin 0.3 - 1.2 mg/dL 0.2(L) 0.3 0.6  Alkaline Phos 38 - 126 U/L 116 109 175(H)  AST 15 - 41 U/L 13(L) 15 13(L)  ALT 0 - 44 U/L '8 11 12    ' Lab Results  Component Value Date   WBC 7.9 08/12/2018   HGB 10.0 (L) 08/12/2018   HCT 30.6 (L) 08/12/2018   MCV 93.9 08/12/2018   PLT 248 08/12/2018   NEUTROABS 4.7 08/12/2018    ASSESSMENT & PLAN:  Malignant neoplasm of upper-outer quadrant of right breast in female, estrogen receptor negative ([facility]) 05/29/2018:Right axillary pain which led to a mammogram and ultrasound that revealed a new mass in the superior UOQ tail of the breast 10 o'clock position by ultrasound measured 8 mm, axilla negative, 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 Neoadjuvant chemotherapy with Adriamycin and Cytoxan dose dense 4 followed byTaxolweekly 12and carboplatin every 3 weeks 2. Followed by breast conserving surgery with sentinel lymph node study  3. Followed by adjuvant radiation therapy Genetics consultation is happening this week Echocardiogram 06/10/2018: EF greater than 65% Breast MRI 06/09/2018: 1.1 cm enhancing mass UOQ right breast ---------------------------------------------------------------------------------------------------------------------------------- Current treatment: Completed 4 cycles of dose since he remains on Cytoxan.  Today is cycle 1 of Taxol. Chemo toxicities: 1.Abdominal cramps: Resolved 2.Mild nausea 3.Fatigue lasted 3 days: The fatigue appears to be getting worse with each treatment 4.Alopecia 5.Chemotherapy-induced anemia hemoglobin 9.9 monitoring closely 6.  Dehydration: Patient felt very dizzy and had blood pressure was fairly low. 7.  Hypokalemia: On  oral potassium replacement Labs have been reviewed  Return to clinic weekly for Taxol and I will see her with cycle 2 and then every 2 weeks thereafter.     No orders of the defined types were placed in this encounter.  The patient has a good understanding of the overall plan. she agrees with it. she will call with any problems that may develop before the next visit here.  Nicholas Lose, MD 08/12/2018  Julious Oka Dorshimer am acting as scribe for Dr. Nicholas Lose.  I have reviewed the above documentation for accuracy and completeness, and I agree with  the above.

## 2018-08-12 ENCOUNTER — Inpatient Hospital Stay: Payer: Managed Care, Other (non HMO)

## 2018-08-12 ENCOUNTER — Encounter: Payer: Self-pay | Admitting: *Deleted

## 2018-08-12 ENCOUNTER — Inpatient Hospital Stay: Payer: Managed Care, Other (non HMO) | Attending: Hematology and Oncology

## 2018-08-12 ENCOUNTER — Other Ambulatory Visit: Payer: Self-pay

## 2018-08-12 ENCOUNTER — Inpatient Hospital Stay (HOSPITAL_BASED_OUTPATIENT_CLINIC_OR_DEPARTMENT_OTHER): Payer: Managed Care, Other (non HMO) | Admitting: Hematology and Oncology

## 2018-08-12 VITALS — BP 151/84 | HR 72 | Temp 98.2°F | Resp 16

## 2018-08-12 DIAGNOSIS — G620 Drug-induced polyneuropathy: Secondary | ICD-10-CM | POA: Insufficient documentation

## 2018-08-12 DIAGNOSIS — Z171 Estrogen receptor negative status [ER-]: Principal | ICD-10-CM

## 2018-08-12 DIAGNOSIS — C50411 Malignant neoplasm of upper-outer quadrant of right female breast: Secondary | ICD-10-CM | POA: Insufficient documentation

## 2018-08-12 DIAGNOSIS — R509 Fever, unspecified: Secondary | ICD-10-CM | POA: Insufficient documentation

## 2018-08-12 DIAGNOSIS — D6481 Anemia due to antineoplastic chemotherapy: Secondary | ICD-10-CM

## 2018-08-12 DIAGNOSIS — K1230 Oral mucositis (ulcerative), unspecified: Secondary | ICD-10-CM | POA: Diagnosis not present

## 2018-08-12 DIAGNOSIS — E876 Hypokalemia: Secondary | ICD-10-CM

## 2018-08-12 DIAGNOSIS — Z5111 Encounter for antineoplastic chemotherapy: Secondary | ICD-10-CM | POA: Diagnosis present

## 2018-08-12 LAB — CBC WITH DIFFERENTIAL (CANCER CENTER ONLY)
Abs Immature Granulocytes: 0.77 10*3/uL — ABNORMAL HIGH (ref 0.00–0.07)
Basophils Absolute: 0.1 10*3/uL (ref 0.0–0.1)
Basophils Relative: 1 %
Eosinophils Absolute: 0.2 10*3/uL (ref 0.0–0.5)
Eosinophils Relative: 2 %
HCT: 30.6 % — ABNORMAL LOW (ref 36.0–46.0)
Hemoglobin: 10 g/dL — ABNORMAL LOW (ref 12.0–15.0)
Immature Granulocytes: 10 %
Lymphocytes Relative: 14 %
Lymphs Abs: 1.1 10*3/uL (ref 0.7–4.0)
MCH: 30.7 pg (ref 26.0–34.0)
MCHC: 32.7 g/dL (ref 30.0–36.0)
MCV: 93.9 fL (ref 80.0–100.0)
Monocytes Absolute: 1.1 10*3/uL — ABNORMAL HIGH (ref 0.1–1.0)
Monocytes Relative: 14 %
Neutro Abs: 4.7 10*3/uL (ref 1.7–7.7)
Neutrophils Relative %: 59 %
Platelet Count: 248 10*3/uL (ref 150–400)
RBC: 3.26 MIL/uL — ABNORMAL LOW (ref 3.87–5.11)
RDW: 18 % — ABNORMAL HIGH (ref 11.5–15.5)
WBC Count: 7.9 10*3/uL (ref 4.0–10.5)
nRBC: 0.6 % — ABNORMAL HIGH (ref 0.0–0.2)

## 2018-08-12 LAB — CMP (CANCER CENTER ONLY)
ALT: 8 U/L (ref 0–44)
AST: 13 U/L — ABNORMAL LOW (ref 15–41)
Albumin: 3.8 g/dL (ref 3.5–5.0)
Alkaline Phosphatase: 116 U/L (ref 38–126)
Anion gap: 10 (ref 5–15)
BUN: 8 mg/dL (ref 6–20)
CO2: 23 mmol/L (ref 22–32)
Calcium: 9 mg/dL (ref 8.9–10.3)
Chloride: 109 mmol/L (ref 98–111)
Creatinine: 0.84 mg/dL (ref 0.44–1.00)
GFR, Est AFR Am: >60 mL/min (ref >60)
GFR, Estimated: >60 mL/min (ref >60)
Glucose, Bld: 88 mg/dL (ref 70–99)
Potassium: 4.1 mmol/L (ref 3.5–5.1)
Sodium: 142 mmol/L (ref 135–145)
Total Bilirubin: 0.2 mg/dL — ABNORMAL LOW (ref 0.3–1.2)
Total Protein: 6.5 g/dL (ref 6.5–8.1)

## 2018-08-12 MED ORDER — PALONOSETRON HCL INJECTION 0.25 MG/5ML
0.2500 mg | Freq: Once | INTRAVENOUS | Status: AC
  Administered 2018-08-12: 0.25 mg via INTRAVENOUS

## 2018-08-12 MED ORDER — SODIUM CHLORIDE 0.9 % IV SOLN
582.6000 mg | Freq: Once | INTRAVENOUS | Status: AC
  Administered 2018-08-12: 580 mg via INTRAVENOUS
  Filled 2018-08-12: qty 58

## 2018-08-12 MED ORDER — SODIUM CHLORIDE 0.9% FLUSH
10.0000 mL | INTRAVENOUS | Status: DC | PRN
  Administered 2018-08-12: 10 mL
  Filled 2018-08-12: qty 10

## 2018-08-12 MED ORDER — SODIUM CHLORIDE 0.9 % IV SOLN
80.0000 mg/m2 | Freq: Once | INTRAVENOUS | Status: AC
  Administered 2018-08-12: 138 mg via INTRAVENOUS
  Filled 2018-08-12: qty 23

## 2018-08-12 MED ORDER — HEPARIN SOD (PORK) LOCK FLUSH 100 UNIT/ML IV SOLN
500.0000 [IU] | Freq: Once | INTRAVENOUS | Status: AC | PRN
  Administered 2018-08-12: 500 [IU]
  Filled 2018-08-12: qty 5

## 2018-08-12 MED ORDER — DIPHENHYDRAMINE HCL 50 MG/ML IJ SOLN
INTRAMUSCULAR | Status: AC
  Filled 2018-08-12: qty 1

## 2018-08-12 MED ORDER — SODIUM CHLORIDE 0.9 % IV SOLN
Freq: Once | INTRAVENOUS | Status: AC
  Administered 2018-08-12: 11:00:00 via INTRAVENOUS
  Filled 2018-08-12: qty 250

## 2018-08-12 MED ORDER — FAMOTIDINE IN NACL 20-0.9 MG/50ML-% IV SOLN: 20.0000 mg | Freq: Once | INTRAVENOUS | Status: DC

## 2018-08-12 MED ORDER — DIPHENHYDRAMINE HCL 50 MG/ML IJ SOLN
50.0000 mg | Freq: Once | INTRAMUSCULAR | Status: AC
  Administered 2018-08-12: 50 mg via INTRAVENOUS

## 2018-08-12 MED ORDER — PALONOSETRON HCL INJECTION 0.25 MG/5ML
INTRAVENOUS | Status: AC
  Filled 2018-08-12: qty 5

## 2018-08-12 MED ORDER — SODIUM CHLORIDE 0.9 % IV SOLN
Freq: Once | INTRAVENOUS | Status: AC
  Administered 2018-08-12: 12:00:00 via INTRAVENOUS
  Filled 2018-08-12: qty 5

## 2018-08-12 MED ORDER — SODIUM CHLORIDE 0.9 % IV SOLN
20.0000 mg | Freq: Once | INTRAVENOUS | Status: AC
  Administered 2018-08-12: 20 mg via INTRAVENOUS
  Filled 2018-08-12: qty 2

## 2018-08-12 NOTE — Patient Instructions (Addendum)
[facility] Discharge Instructions for Patients Receiving Chemotherapy  Today you received the following chemotherapy agents taxol/carboplatin   To help prevent nausea and vomiting after your treatment, we encourage you to take your nausea medication as directed: DO NOT TAKE ZOFRAN FOR 3 DAYS   If you develop nausea and vomiting that is not controlled by your nausea medication, call the clinic.   BELOW ARE SYMPTOMS THAT SHOULD BE REPORTED IMMEDIATELY:  *FEVER GREATER THAN 100.5 F  *CHILLS WITH OR WITHOUT FEVER  NAUSEA AND VOMITING THAT IS NOT CONTROLLED WITH YOUR NAUSEA MEDICATION  *UNUSUAL SHORTNESS OF BREATH  *UNUSUAL BRUISING OR BLEEDING  TENDERNESS IN MOUTH AND THROAT WITH OR WITHOUT PRESENCE OF ULCERS  *URINARY PROBLEMS  *BOWEL PROBLEMS  UNUSUAL RASH Items with * indicate a potential emergency and should be followed up as soon as possible.  Feel free to call the clinic you have any questions or concerns. The clinic phone number is [phone].  Paclitaxel injection What is this medicine? PACLITAXEL (PAK li TAX el) is a chemotherapy drug. It targets fast dividing cells, like cancer cells, and causes these cells to die. This medicine is used to treat ovarian cancer, breast cancer, lung cancer, Kaposi's sarcoma, and other cancers. This medicine may be used for other purposes; ask your health care provider or pharmacist if you have questions. COMMON BRAND NAME(S): Onxol, Taxol What should I tell my health care provider before I take this medicine? They need to know if you have any of these conditions: -history of irregular heartbeat -liver disease -low blood counts, like low white cell, platelet, or red cell counts -lung or breathing disease, like asthma -tingling of the fingers or toes, or other nerve disorder -an unusual or allergic reaction to paclitaxel, alcohol, polyoxyethylated castor oil, other chemotherapy, other medicines, foods, dyes, or  preservatives -pregnant or trying to get pregnant -breast-feeding How should I use this medicine? This drug is given as an infusion into a vein. It is administered in a hospital or clinic by a specially trained health care professional. Talk to your pediatrician regarding the use of this medicine in children. Special care may be needed. Overdosage: If you think you have taken too much of this medicine contact a poison control center or emergency room at once. NOTE: This medicine is only for you. Do not share this medicine with others. What if I miss a dose? It is important not to miss your dose. Call your doctor or health care professional if you are unable to keep an appointment. What may interact with this medicine? Do not take this medicine with any of the following medications: -disulfiram -metronidazole This medicine may also interact with the following medications: -antiviral medicines for hepatitis, HIV or AIDS -certain antibiotics like erythromycin and clarithromycin -certain medicines for fungal infections like ketoconazole and itraconazole -certain medicines for seizures like carbamazepine, phenobarbital, phenytoin -gemfibrozil -nefazodone -rifampin -St. John's wort This list may not describe all possible interactions. Give your health care provider a list of all the medicines, herbs, non-prescription drugs, or dietary supplements you use. Also tell them if you smoke, drink alcohol, or use illegal drugs. Some items may interact with your medicine. What should I watch for while using this medicine? Your condition will be monitored carefully while you are receiving this medicine. You will need important blood work done while you are taking this medicine. This medicine can cause serious allergic reactions. To reduce your risk you will need to take other medicine(s) before treatment  with this medicine. If you experience allergic reactions like skin rash, itching or hives, swelling of  the face, lips, or tongue, tell your doctor or health care professional right away. In some cases, you may be given additional medicines to help with side effects. Follow all directions for their use. This drug may make you feel generally unwell. This is not uncommon, as chemotherapy can affect healthy cells as well as cancer cells. Report any side effects. Continue your course of treatment even though you feel ill unless your doctor tells you to stop. Call your doctor or health care professional for advice if you get a fever, chills or sore throat, or other symptoms of a cold or flu. Do not treat yourself. This drug decreases your body's ability to fight infections. Try to avoid being around people who are sick. This medicine may increase your risk to bruise or bleed. Call your doctor or health care professional if you notice any unusual bleeding. Be careful brushing and flossing your teeth or using a toothpick because you may get an infection or bleed more easily. If you have any dental work done, tell your dentist you are receiving this medicine. Avoid taking products that contain aspirin, acetaminophen, ibuprofen, naproxen, or ketoprofen unless instructed by your doctor. These medicines may hide a fever. Do not become pregnant while taking this medicine. Women should inform their doctor if they wish to become pregnant or think they might be pregnant. There is a potential for serious side effects to an unborn child. Talk to your health care professional or pharmacist for more information. Do not breast-feed an infant while taking this medicine. Men are advised not to father a child while receiving this medicine. This product may contain alcohol. Ask your pharmacist or healthcare provider if this medicine contains alcohol. Be sure to tell all healthcare providers you are taking this medicine. Certain medicines, like metronidazole and disulfiram, can cause an unpleasant reaction when taken with alcohol.  The reaction includes flushing, headache, nausea, vomiting, sweating, and increased thirst. The reaction can last from 30 minutes to several hours. What side effects may I notice from receiving this medicine? Side effects that you should report to your doctor or health care professional as soon as possible: -allergic reactions like skin rash, itching or hives, swelling of the face, lips, or tongue -breathing problems -changes in vision -fast, irregular heartbeat -high or low blood pressure -mouth sores -pain, tingling, numbness in the hands or feet -signs of decreased platelets or bleeding - bruising, pinpoint red spots on the skin, black, tarry stools, blood in the urine -signs of decreased red blood cells - unusually weak or tired, feeling faint or lightheaded, falls -signs of infection - fever or chills, cough, sore throat, pain or difficulty passing urine -signs and symptoms of liver injury like dark yellow or brown urine; general ill feeling or flu-like symptoms; light-colored stools; loss of appetite; nausea; right upper belly pain; unusually weak or tired; yellowing of the eyes or skin -swelling of the ankles, feet, hands -unusually slow heartbeat Side effects that usually do not require medical attention (report to your doctor or health care professional if they continue or are bothersome): -diarrhea -hair loss -loss of appetite -muscle or joint pain -nausea, vomiting -pain, redness, or irritation at site where injected -tiredness This list may not describe all possible side effects. Call your doctor for medical advice about side effects. You may report side effects to FDA at 1-800-FDA-1088. Where should I keep my medicine? This  drug is given in a hospital or clinic and will not be stored at home. NOTE: This sheet is a summary. It may not cover all possible information. If you have questions about this medicine, talk to your doctor, pharmacist, or health care provider.  2019  Elsevier/Gold Standard (2016-12-18 13:14:55)  Carboplatin injection What is this medicine? CARBOPLATIN (KAR boe pla tin) is a chemotherapy drug. It targets fast dividing cells, like cancer cells, and causes these cells to die. This medicine is used to treat ovarian cancer and many other cancers. This medicine may be used for other purposes; ask your health care provider or pharmacist if you have questions. COMMON BRAND NAME(S): Paraplatin What should I tell my health care provider before I take this medicine? They need to know if you have any of these conditions: -blood disorders -hearing problems -kidney disease -recent or ongoing radiation therapy -an unusual or allergic reaction to carboplatin, cisplatin, other chemotherapy, other medicines, foods, dyes, or preservatives -pregnant or trying to get pregnant -breast-feeding How should I use this medicine? This drug is usually given as an infusion into a vein. It is administered in a hospital or clinic by a specially trained health care professional. Talk to your pediatrician regarding the use of this medicine in children. Special care may be needed. Overdosage: If you think you have taken too much of this medicine contact a poison control center or emergency room at once. NOTE: This medicine is only for you. Do not share this medicine with others. What if I miss a dose? It is important not to miss a dose. Call your doctor or health care professional if you are unable to keep an appointment. What may interact with this medicine? -medicines for seizures -medicines to increase blood counts like filgrastim, pegfilgrastim, sargramostim -some antibiotics like amikacin, gentamicin, neomycin, streptomycin, tobramycin -vaccines Talk to your doctor or health care professional before taking any of these medicines: -acetaminophen -aspirin -ibuprofen -ketoprofen -naproxen This list may not describe all possible interactions. Give your health  care provider a list of all the medicines, herbs, non-prescription drugs, or dietary supplements you use. Also tell them if you smoke, drink alcohol, or use illegal drugs. Some items may interact with your medicine. What should I watch for while using this medicine? Your condition will be monitored carefully while you are receiving this medicine. You will need important blood work done while you are taking this medicine. This drug may make you feel generally unwell. This is not uncommon, as chemotherapy can affect healthy cells as well as cancer cells. Report any side effects. Continue your course of treatment even though you feel ill unless your doctor tells you to stop. In some cases, you may be given additional medicines to help with side effects. Follow all directions for their use. Call your doctor or health care professional for advice if you get a fever, chills or sore throat, or other symptoms of a cold or flu. Do not treat yourself. This drug decreases your body's ability to fight infections. Try to avoid being around people who are sick. This medicine may increase your risk to bruise or bleed. Call your doctor or health care professional if you notice any unusual bleeding. Be careful brushing and flossing your teeth or using a toothpick because you may get an infection or bleed more easily. If you have any dental work done, tell your dentist you are receiving this medicine. Avoid taking products that contain aspirin, acetaminophen, ibuprofen, naproxen, or ketoprofen unless instructed by  your doctor. These medicines may hide a fever. Do not become pregnant while taking this medicine. Women should inform their doctor if they wish to become pregnant or think they might be pregnant. There is a potential for serious side effects to an unborn child. Talk to your health care professional or pharmacist for more information. Do not breast-feed an infant while taking this medicine. What side effects may I  notice from receiving this medicine? Side effects that you should report to your doctor or health care professional as soon as possible: -allergic reactions like skin rash, itching or hives, swelling of the face, lips, or tongue -signs of infection - fever or chills, cough, sore throat, pain or difficulty passing urine -signs of decreased platelets or bleeding - bruising, pinpoint red spots on the skin, black, tarry stools, nosebleeds -signs of decreased red blood cells - unusually weak or tired, fainting spells, lightheadedness -breathing problems -changes in hearing -changes in vision -chest pain -high blood pressure -low blood counts - This drug may decrease the number of white blood cells, red blood cells and platelets. You may be at increased risk for infections and bleeding. -nausea and vomiting -pain, swelling, redness or irritation at the injection site -pain, tingling, numbness in the hands or feet -problems with balance, talking, walking -trouble passing urine or change in the amount of urine Side effects that usually do not require medical attention (report to your doctor or health care professional if they continue or are bothersome): -hair loss -loss of appetite -metallic taste in the mouth or changes in taste This list may not describe all possible side effects. Call your doctor for medical advice about side effects. You may report side effects to FDA at 1-800-FDA-1088. Where should I keep my medicine? This drug is given in a hospital or clinic and will not be stored at home. NOTE: This sheet is a summary. It may not cover all possible information. If you have questions about this medicine, talk to your doctor, pharmacist, or health care provider.  2019 Elsevier/Gold Standard (2007-07-22 14:38:05)

## 2018-08-13 ENCOUNTER — Telehealth: Payer: Self-pay | Admitting: Hematology and Oncology

## 2018-08-13 ENCOUNTER — Telehealth: Payer: Self-pay

## 2018-08-13 NOTE — Telephone Encounter (Signed)
Called regard additions to schedule

## 2018-08-13 NOTE — Telephone Encounter (Signed)
This RN called patient for follow up after receiving chemotherapy for the first time yesterday, 08/12/2018. Pt stated she is feeling good with no complaints. Pt denied any nausea, vomiting, fever or pain. Pt aware to continue to increase fluid intake over the next few days. Pt aware to call the clinic with any questions or concerns. Pt verbalized understanding and had no further questions.

## 2018-08-18 NOTE — Progress Notes (Signed)
Patient Care Team: [name], MD as PCP - General (Family Medicine) Excell Seltzer, MD as Consulting Physician (General Surgery) Nicholas Lose, MD as Consulting Physician (Hematology and Oncology) Kyung Rudd, MD as Consulting Physician (Radiation Oncology)  DIAGNOSIS:    ICD-10-CM   1. 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 The CancerNext gene panel offered by [employer] includes sequencing and rearrangement analysis for the following 34 genes:   APC, ATM, BARD1, BMPR1A, BRCA1, BRCA2, BRIP1, CDH1, CDK4, CDKN2A, CHEK2, DICER1, EPCAM, GREM1, HOXB13MLH1, MRE11A, MSH2, MSH6, MUTYH, NBN, NF1, PALB2, PMS2, POLD1, POLE, PTEN, RAD50, RAD51D, RAD51C, SMAD4, SMARCA4, STK11, and TP53. The report date is 07/15/2018.     CHIEF COMPLIANT: Cycle 2 Taxol  INTERVAL HISTORY: Katrina Liu is a 58 y.o. with above-mentioned history of right breast cancer who completed 4 cycles of neoadjuvant chemotherapy with dose dense Adriamycin and  Cytoxan and presents to the clinic today for cycle 2 of Taxol.  She has tolerated cycle 1 of Taxol extremely well without any problems or concerns.  REVIEW OF SYSTEMS:   Constitutional: Denies fevers, chills or abnormal weight loss Eyes: Denies blurriness of vision Ears, nose, mouth, throat, and face: Denies mucositis or sore throat Respiratory: Denies cough, dyspnea or wheezes Cardiovascular: Denies palpitation, chest discomfort Gastrointestinal: Denies nausea, heartburn or change in bowel habits Skin: Denies abnormal skin rashes Lymphatics: Denies new lymphadenopathy or easy bruising Neurological: Denies numbness, tingling or new weaknesses Behavioral/Psych: Mood is stable, no new changes  Extremities: No lower extremity edema Breast: denies any pain or lumps or nodules in either breasts All other systems were reviewed with the patient and are negative.  I have reviewed the past medical history, past surgical history, social history and family history with the patient and they are unchanged from previous note.  ALLERGIES:  is allergic to lisinopril.  MEDICATIONS:  Current Outpatient Medications  Medication Sig Dispense Refill  . Ascorbic Acid (VITAMIN C) 500 MG CAPS Take 500 mg by mouth daily.     [name] aspirin EC 81 MG EC tablet Take 1 tablet (81 mg total) by mouth daily. 90 tablet 0  . Biotin 10000 MCG TABS Take 10,000 mcg by mouth daily.    . Calcium Carbonate-Vitamin D (CALCIUM 500/D PO) Take by mouth.    . chlorthalidone (HYGROTON) 25 MG tablet Take 1 tablet (25 mg total) by mouth daily. (Patient not taking: Reported on 06/04/2018) 90 tablet 2  . Cyanocobalamin (VITAMIN B 12 PO) Take by mouth.    [name]  dexamethasone (DECADRON) 4 MG tablet Take 1 tablet (4 mg total) by mouth daily. Take 1 tablet day after chemo and 1 tablet 2 days after chemo with food (Patient not taking: Reported on 07/21/2018) 8 tablet 0  . dicyclomine (BENTYL) 10 MG capsule Take 1 capsule (10 mg total) by mouth 2 (two)  times daily as needed for spasms. (Patient not taking: Reported on 07/21/2018) 30 capsule 1  . Flaxseed, Linseed, (FLAX SEED OIL) 1000 MG CAPS Take 1,000 mg by mouth once a week.    . hydrochlorothiazide (HYDRODIURIL) 25 MG tablet Take 25 mg by mouth daily.    [name] HYDROcodone-Acetaminophen (LORTAB) 10-300 MG/15ML SOLN Take 5 mLs by mouth every 6 (six) hours as needed. Do not drive, work, or operate machinery while taking this medication (Patient not taking: Reported on 06/04/2018) 100 mL 0  . labetalol (NORMODYNE) 100 MG tablet Take 1 tablet (100 mg total) by mouth 2 (two) times daily. 180 tablet 3  . lidocaine-prilocaine (EMLA) cream Apply to affected area once 30 g 3  . LORazepam (ATIVAN) 0.5 MG tablet Take 1 tablet (0.5 mg total) by mouth at bedtime as needed (Nausea or vomiting). (Patient not taking: Reported on 07/21/2018) 30 tablet 0  . mometasone (ELOCON) 0.1 % ointment Apply 1 application topically as needed (dry skin).     . Multiple Vitamin (MULTIVITAMIN) tablet Take 1 tablet by mouth daily.    . ondansetron (ZOFRAN) 8 MG tablet Take 1 tablet (8 mg total) by mouth 2 (two) times daily as needed. Start on the third day after chemotherapy. 30 tablet 1  . potassium chloride SA (K-DUR,KLOR-CON) 20 MEQ tablet Take 1 tablet (20 mEq total) by mouth daily. 30 tablet 2  . prochlorperazine (COMPAZINE) 10 MG tablet Take 1 tablet (10 mg total) by mouth every 6 (six) hours as needed (Nausea or vomiting). 30 tablet 1  . Turmeric 500 MG TABS Take 500 mg by mouth daily.    [name] zinc gluconate 50 MG tablet Take 50 mg by mouth daily.     No current facility-administered medications for this visit.     PHYSICAL EXAMINATION: ECOG PERFORMANCE STATUS: 1 - Symptomatic but completely ambulatory  There were no vitals filed for this visit. There were no vitals filed for this visit.  GENERAL: alert, no distress and comfortable SKIN: skin color, texture, turgor are normal, no rashes or significant lesions EYES:  normal, Conjunctiva are pink and non-injected, sclera clear OROPHARYNX: no exudate, no erythema and lips, buccal mucosa, and tongue normal  NECK: supple, thyroid normal size, non-tender, without nodularity LYMPH: no palpable lymphadenopathy in the cervical, axillary or inguinal LUNGS: clear to auscultation and percussion with normal breathing effort HEART: regular rate & rhythm and no murmurs and no lower extremity edema ABDOMEN: abdomen soft, non-tender and normal bowel sounds MUSCULOSKELETAL: no cyanosis of digits and no clubbing  NEURO: alert & oriented x 3 with fluent speech, no focal motor/sensory deficits EXTREMITIES: No lower extremity edema  LABORATORY DATA:  I have reviewed the data as listed CMP Latest Ref Rng & Units 08/12/2018 07/29/2018 07/21/2018  Glucose 70 - 99 mg/dL 88 86 102(H)  BUN 6 - 20 mg/dL '8 9 17  ' Creatinine 0.44 - 1.00 mg/dL 0.84 0.87 0.83  Sodium 135 - 145 mmol/L 142 142 139  Potassium 3.5 - 5.1 mmol/L 4.1 4.0 3.1(L)  Chloride 98 - 111 mmol/L 109 108 102  CO2 22 - 32 mmol/L '23 24 27  ' Calcium 8.9 - 10.3 mg/dL 9.0 9.3  9.3  Total Protein 6.5 - 8.1 g/dL 6.5 6.5 6.4(L)  Total Bilirubin 0.3 - 1.2 mg/dL 0.2(L) 0.3 0.6  Alkaline Phos 38 - 126 U/L 116 109 175(H)  AST 15 - 41 U/L 13(L) 15 13(L)  ALT 0 - 44 U/L '8 11 12    ' Lab Results  Component Value Date   WBC 7.9 08/12/2018   HGB 10.0 (L) 08/12/2018   HCT 30.6 (L) 08/12/2018   MCV 93.9 08/12/2018   PLT 248 08/12/2018   NEUTROABS 4.7 08/12/2018    ASSESSMENT & PLAN:  Malignant neoplasm of upper-outer quadrant of right breast in female, estrogen receptor negative ([facility]) 05/29/2018:Right axillary pain which led to a mammogram and ultrasound that revealed a new mass in the superior UOQ tail of the breast 10 o'clock position by ultrasound measured 8 mm, axilla negative, 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 Neoadjuvant chemotherapy with  Adriamycin and Cytoxan dose dense 4 followed byTaxolweekly 12and carboplatin every 3 weeks 2. Followed by breast conserving surgery with sentinel lymph node study  3. Followed by adjuvant radiation therapy Genetics consultation is happening this week Echocardiogram 06/10/2018: EF greater than 65% Breast MRI 06/09/2018: 1.1 cm enhancing mass UOQ right breast ---------------------------------------------------------------------------------------------------------------------------------- Current treatment: Completed 4 cycles ofdose dense Adriamycin and Cytoxan, today cycle 2 Taxol Chemo toxicities: 1.Fatigue  2. Alopecia 3.Chemotherapy-induced anemia hemoglobin 9.9 monitoring closely 4.  Hypokalemia: On oral potassium replacement Labs have been reviewed  Return to clinic weekly for Taxol and every other week for follow-up with me    No orders of the defined types were placed in this encounter.  The patient has a good understanding of the overall plan. she agrees with it. she will call with any problems that may develop before the next visit here.  Nicholas Lose, MD 08/19/2018  Julious Oka Dorshimer am acting as scribe for Dr. Nicholas Lose.  I have reviewed the above documentation for accuracy and completeness, and I agree with the above.

## 2018-08-19 ENCOUNTER — Inpatient Hospital Stay: Payer: Managed Care, Other (non HMO)

## 2018-08-19 ENCOUNTER — Inpatient Hospital Stay (HOSPITAL_BASED_OUTPATIENT_CLINIC_OR_DEPARTMENT_OTHER): Payer: Managed Care, Other (non HMO) | Admitting: Hematology and Oncology

## 2018-08-19 ENCOUNTER — Other Ambulatory Visit: Payer: Self-pay

## 2018-08-19 DIAGNOSIS — D6481 Anemia due to antineoplastic chemotherapy: Secondary | ICD-10-CM

## 2018-08-19 DIAGNOSIS — C50411 Malignant neoplasm of upper-outer quadrant of right female breast: Secondary | ICD-10-CM

## 2018-08-19 DIAGNOSIS — Z95828 Presence of other vascular implants and grafts: Secondary | ICD-10-CM

## 2018-08-19 DIAGNOSIS — Z171 Estrogen receptor negative status [ER-]: Secondary | ICD-10-CM

## 2018-08-19 DIAGNOSIS — E876 Hypokalemia: Secondary | ICD-10-CM | POA: Diagnosis not present

## 2018-08-19 DIAGNOSIS — Z5111 Encounter for antineoplastic chemotherapy: Secondary | ICD-10-CM | POA: Diagnosis not present

## 2018-08-19 LAB — CMP (CANCER CENTER ONLY)
ALT: 12 U/L (ref 0–44)
AST: 15 U/L (ref 15–41)
Albumin: 3.7 g/dL (ref 3.5–5.0)
Alkaline Phosphatase: 85 U/L (ref 38–126)
Anion gap: 8 (ref 5–15)
BUN: 12 mg/dL (ref 6–20)
CO2: 25 mmol/L (ref 22–32)
Calcium: 8.9 mg/dL (ref 8.9–10.3)
Chloride: 109 mmol/L (ref 98–111)
Creatinine: 0.75 mg/dL (ref 0.44–1.00)
GFR, Est AFR Am: >60 mL/min (ref >60)
GFR, Estimated: >60 mL/min (ref >60)
Glucose, Bld: 82 mg/dL (ref 70–99)
Potassium: 4.5 mmol/L (ref 3.5–5.1)
Sodium: 142 mmol/L (ref 135–145)
Total Bilirubin: 0.4 mg/dL (ref 0.3–1.2)
Total Protein: 6.3 g/dL — ABNORMAL LOW (ref 6.5–8.1)

## 2018-08-19 LAB — CBC WITH DIFFERENTIAL (CANCER CENTER ONLY)
Abs Immature Granulocytes: 0.02 10*3/uL (ref 0.00–0.07)
Basophils Absolute: 0 10*3/uL (ref 0.0–0.1)
Basophils Relative: 2 %
Eosinophils Absolute: 0.1 10*3/uL (ref 0.0–0.5)
Eosinophils Relative: 3 %
HCT: 28.5 % — ABNORMAL LOW (ref 36.0–46.0)
Hemoglobin: 9.5 g/dL — ABNORMAL LOW (ref 12.0–15.0)
Immature Granulocytes: 1 %
Lymphocytes Relative: 25 %
Lymphs Abs: 0.6 10*3/uL — ABNORMAL LOW (ref 0.7–4.0)
MCH: 30.8 pg (ref 26.0–34.0)
MCHC: 33.3 g/dL (ref 30.0–36.0)
MCV: 92.5 fL (ref 80.0–100.0)
Monocytes Absolute: 0.3 10*3/uL (ref 0.1–1.0)
Monocytes Relative: 11 %
Neutro Abs: 1.4 10*3/uL — ABNORMAL LOW (ref 1.7–7.7)
Neutrophils Relative %: 58 %
Platelet Count: 236 10*3/uL (ref 150–400)
RBC: 3.08 MIL/uL — ABNORMAL LOW (ref 3.87–5.11)
RDW: 16.5 % — ABNORMAL HIGH (ref 11.5–15.5)
WBC Count: 2.5 10*3/uL — ABNORMAL LOW (ref 4.0–10.5)
nRBC: 0 % (ref 0.0–0.2)

## 2018-08-19 MED ORDER — SODIUM CHLORIDE 0.9 % IV SOLN: 10.0000 mg | Freq: Once | INTRAVENOUS | Status: DC

## 2018-08-19 MED ORDER — SODIUM CHLORIDE 0.9% FLUSH
10.0000 mL | INTRAVENOUS | Status: DC | PRN
  Filled 2018-08-19: qty 10

## 2018-08-19 MED ORDER — DIPHENHYDRAMINE HCL 50 MG/ML IJ SOLN
50.0000 mg | Freq: Once | INTRAMUSCULAR | Status: AC
  Administered 2018-08-19: 10:00:00 50 mg via INTRAVENOUS

## 2018-08-19 MED ORDER — SODIUM CHLORIDE 0.9 % IV SOLN
20.0000 mg | Freq: Once | INTRAVENOUS | Status: AC
  Administered 2018-08-19: 20 mg via INTRAVENOUS
  Filled 2018-08-19: qty 2

## 2018-08-19 MED ORDER — SODIUM CHLORIDE 0.9 % IV SOLN
80.0000 mg/m2 | Freq: Once | INTRAVENOUS | Status: AC
  Administered 2018-08-19: 138 mg via INTRAVENOUS
  Filled 2018-08-19: qty 23

## 2018-08-19 MED ORDER — DEXAMETHASONE SODIUM PHOSPHATE 10 MG/ML IJ SOLN
INTRAMUSCULAR | Status: AC
  Filled 2018-08-19: qty 1

## 2018-08-19 MED ORDER — SODIUM CHLORIDE 0.9 % IV SOLN
Freq: Once | INTRAVENOUS | Status: AC
  Administered 2018-08-19: 10:00:00 via INTRAVENOUS
  Filled 2018-08-19: qty 250

## 2018-08-19 MED ORDER — HEPARIN SOD (PORK) LOCK FLUSH 100 UNIT/ML IV SOLN
500.0000 [IU] | Freq: Once | INTRAVENOUS | Status: DC | PRN
  Filled 2018-08-19: qty 5

## 2018-08-19 MED ORDER — DIPHENHYDRAMINE HCL 50 MG/ML IJ SOLN
INTRAMUSCULAR | Status: AC
  Filled 2018-08-19: qty 1

## 2018-08-19 MED ORDER — SODIUM CHLORIDE 0.9% FLUSH
10.0000 mL | Freq: Once | INTRAVENOUS | Status: AC
  Administered 2018-08-19: 10 mL
  Filled 2018-08-19: qty 10

## 2018-08-19 MED ORDER — DEXAMETHASONE SODIUM PHOSPHATE 10 MG/ML IJ SOLN
10.0000 mg | Freq: Once | INTRAMUSCULAR | Status: AC
  Administered 2018-08-19: 10 mg via INTRAVENOUS

## 2018-08-19 NOTE — Progress Notes (Signed)
Okay to treat today with ANC 1.6, per Dr. Lindi Adie.

## 2018-08-19 NOTE — Patient Instructions (Signed)
[facility] Discharge Instructions for Patients Receiving Chemotherapy  Today you received the following chemotherapy agents taxol/carboplatin   To help prevent nausea and vomiting after your treatment, we encourage you to take your nausea medication as directed: DO NOT TAKE ZOFRAN FOR 3 DAYS   If you develop nausea and vomiting that is not controlled by your nausea medication, call the clinic.   BELOW ARE SYMPTOMS THAT SHOULD BE REPORTED IMMEDIATELY:  *FEVER GREATER THAN 100.5 F  *CHILLS WITH OR WITHOUT FEVER  NAUSEA AND VOMITING THAT IS NOT CONTROLLED WITH YOUR NAUSEA MEDICATION  *UNUSUAL SHORTNESS OF BREATH  *UNUSUAL BRUISING OR BLEEDING  TENDERNESS IN MOUTH AND THROAT WITH OR WITHOUT PRESENCE OF ULCERS  *URINARY PROBLEMS  *BOWEL PROBLEMS  UNUSUAL RASH Items with * indicate a potential emergency and should be followed up as soon as possible.  Feel free to call the clinic you have any questions or concerns. The clinic phone number is [phone].  Paclitaxel injection What is this medicine? PACLITAXEL (PAK li TAX el) is a chemotherapy drug. It targets fast dividing cells, like cancer cells, and causes these cells to die. This medicine is used to treat ovarian cancer, breast cancer, lung cancer, Kaposi's sarcoma, and other cancers. This medicine may be used for other purposes; ask your health care provider or pharmacist if you have questions. COMMON BRAND NAME(S): Onxol, Taxol What should I tell my health care provider before I take this medicine? They need to know if you have any of these conditions: -history of irregular heartbeat -liver disease -low blood counts, like low white cell, platelet, or red cell counts -lung or breathing disease, like asthma -tingling of the fingers or toes, or other nerve disorder -an unusual or allergic reaction to paclitaxel, alcohol, polyoxyethylated castor oil, other chemotherapy, other medicines, foods, dyes, or  preservatives -pregnant or trying to get pregnant -breast-feeding How should I use this medicine? This drug is given as an infusion into a vein. It is administered in a hospital or clinic by a specially trained health care professional. Talk to your pediatrician regarding the use of this medicine in children. Special care may be needed. Overdosage: If you think you have taken too much of this medicine contact a poison control center or emergency room at once. NOTE: This medicine is only for you. Do not share this medicine with others. What if I miss a dose? It is important not to miss your dose. Call your doctor or health care professional if you are unable to keep an appointment. What may interact with this medicine? Do not take this medicine with any of the following medications: -disulfiram -metronidazole This medicine may also interact with the following medications: -antiviral medicines for hepatitis, HIV or AIDS -certain antibiotics like erythromycin and clarithromycin -certain medicines for fungal infections like ketoconazole and itraconazole -certain medicines for seizures like carbamazepine, phenobarbital, phenytoin -gemfibrozil -nefazodone -rifampin -St. John's wort This list may not describe all possible interactions. Give your health care provider a list of all the medicines, herbs, non-prescription drugs, or dietary supplements you use. Also tell them if you smoke, drink alcohol, or use illegal drugs. Some items may interact with your medicine. What should I watch for while using this medicine? Your condition will be monitored carefully while you are receiving this medicine. You will need important blood work done while you are taking this medicine. This medicine can cause serious allergic reactions. To reduce your risk you will need to take other medicine(s) before treatment  with this medicine. If you experience allergic reactions like skin rash, itching or hives, swelling of  the face, lips, or tongue, tell your doctor or health care professional right away. In some cases, you may be given additional medicines to help with side effects. Follow all directions for their use. This drug may make you feel generally unwell. This is not uncommon, as chemotherapy can affect healthy cells as well as cancer cells. Report any side effects. Continue your course of treatment even though you feel ill unless your doctor tells you to stop. Call your doctor or health care professional for advice if you get a fever, chills or sore throat, or other symptoms of a cold or flu. Do not treat yourself. This drug decreases your body's ability to fight infections. Try to avoid being around people who are sick. This medicine may increase your risk to bruise or bleed. Call your doctor or health care professional if you notice any unusual bleeding. Be careful brushing and flossing your teeth or using a toothpick because you may get an infection or bleed more easily. If you have any dental work done, tell your dentist you are receiving this medicine. Avoid taking products that contain aspirin, acetaminophen, ibuprofen, naproxen, or ketoprofen unless instructed by your doctor. These medicines may hide a fever. Do not become pregnant while taking this medicine. Women should inform their doctor if they wish to become pregnant or think they might be pregnant. There is a potential for serious side effects to an unborn child. Talk to your health care professional or pharmacist for more information. Do not breast-feed an infant while taking this medicine. Men are advised not to father a child while receiving this medicine. This product may contain alcohol. Ask your pharmacist or healthcare provider if this medicine contains alcohol. Be sure to tell all healthcare providers you are taking this medicine. Certain medicines, like metronidazole and disulfiram, can cause an unpleasant reaction when taken with alcohol.  The reaction includes flushing, headache, nausea, vomiting, sweating, and increased thirst. The reaction can last from 30 minutes to several hours. What side effects may I notice from receiving this medicine? Side effects that you should report to your doctor or health care professional as soon as possible: -allergic reactions like skin rash, itching or hives, swelling of the face, lips, or tongue -breathing problems -changes in vision -fast, irregular heartbeat -high or low blood pressure -mouth sores -pain, tingling, numbness in the hands or feet -signs of decreased platelets or bleeding - bruising, pinpoint red spots on the skin, black, tarry stools, blood in the urine -signs of decreased red blood cells - unusually weak or tired, feeling faint or lightheaded, falls -signs of infection - fever or chills, cough, sore throat, pain or difficulty passing urine -signs and symptoms of liver injury like dark yellow or brown urine; general ill feeling or flu-like symptoms; light-colored stools; loss of appetite; nausea; right upper belly pain; unusually weak or tired; yellowing of the eyes or skin -swelling of the ankles, feet, hands -unusually slow heartbeat Side effects that usually do not require medical attention (report to your doctor or health care professional if they continue or are bothersome): -diarrhea -hair loss -loss of appetite -muscle or joint pain -nausea, vomiting -pain, redness, or irritation at site where injected -tiredness This list may not describe all possible side effects. Call your doctor for medical advice about side effects. You may report side effects to FDA at 1-800-FDA-1088. Where should I keep my medicine? This  drug is given in a hospital or clinic and will not be stored at home. NOTE: This sheet is a summary. It may not cover all possible information. If you have questions about this medicine, talk to your doctor, pharmacist, or health care provider.  2019  Elsevier/Gold Standard (2016-12-18 13:14:55)  Carboplatin injection What is this medicine? CARBOPLATIN (KAR boe pla tin) is a chemotherapy drug. It targets fast dividing cells, like cancer cells, and causes these cells to die. This medicine is used to treat ovarian cancer and many other cancers. This medicine may be used for other purposes; ask your health care provider or pharmacist if you have questions. COMMON BRAND NAME(S): Paraplatin What should I tell my health care provider before I take this medicine? They need to know if you have any of these conditions: -blood disorders -hearing problems -kidney disease -recent or ongoing radiation therapy -an unusual or allergic reaction to carboplatin, cisplatin, other chemotherapy, other medicines, foods, dyes, or preservatives -pregnant or trying to get pregnant -breast-feeding How should I use this medicine? This drug is usually given as an infusion into a vein. It is administered in a hospital or clinic by a specially trained health care professional. Talk to your pediatrician regarding the use of this medicine in children. Special care may be needed. Overdosage: If you think you have taken too much of this medicine contact a poison control center or emergency room at once. NOTE: This medicine is only for you. Do not share this medicine with others. What if I miss a dose? It is important not to miss a dose. Call your doctor or health care professional if you are unable to keep an appointment. What may interact with this medicine? -medicines for seizures -medicines to increase blood counts like filgrastim, pegfilgrastim, sargramostim -some antibiotics like amikacin, gentamicin, neomycin, streptomycin, tobramycin -vaccines Talk to your doctor or health care professional before taking any of these medicines: -acetaminophen -aspirin -ibuprofen -ketoprofen -naproxen This list may not describe all possible interactions. Give your health  care provider a list of all the medicines, herbs, non-prescription drugs, or dietary supplements you use. Also tell them if you smoke, drink alcohol, or use illegal drugs. Some items may interact with your medicine. What should I watch for while using this medicine? Your condition will be monitored carefully while you are receiving this medicine. You will need important blood work done while you are taking this medicine. This drug may make you feel generally unwell. This is not uncommon, as chemotherapy can affect healthy cells as well as cancer cells. Report any side effects. Continue your course of treatment even though you feel ill unless your doctor tells you to stop. In some cases, you may be given additional medicines to help with side effects. Follow all directions for their use. Call your doctor or health care professional for advice if you get a fever, chills or sore throat, or other symptoms of a cold or flu. Do not treat yourself. This drug decreases your body's ability to fight infections. Try to avoid being around people who are sick. This medicine may increase your risk to bruise or bleed. Call your doctor or health care professional if you notice any unusual bleeding. Be careful brushing and flossing your teeth or using a toothpick because you may get an infection or bleed more easily. If you have any dental work done, tell your dentist you are receiving this medicine. Avoid taking products that contain aspirin, acetaminophen, ibuprofen, naproxen, or ketoprofen unless instructed by  your doctor. These medicines may hide a fever. Do not become pregnant while taking this medicine. Women should inform their doctor if they wish to become pregnant or think they might be pregnant. There is a potential for serious side effects to an unborn child. Talk to your health care professional or pharmacist for more information. Do not breast-feed an infant while taking this medicine. What side effects may I  notice from receiving this medicine? Side effects that you should report to your doctor or health care professional as soon as possible: -allergic reactions like skin rash, itching or hives, swelling of the face, lips, or tongue -signs of infection - fever or chills, cough, sore throat, pain or difficulty passing urine -signs of decreased platelets or bleeding - bruising, pinpoint red spots on the skin, black, tarry stools, nosebleeds -signs of decreased red blood cells - unusually weak or tired, fainting spells, lightheadedness -breathing problems -changes in hearing -changes in vision -chest pain -high blood pressure -low blood counts - This drug may decrease the number of white blood cells, red blood cells and platelets. You may be at increased risk for infections and bleeding. -nausea and vomiting -pain, swelling, redness or irritation at the injection site -pain, tingling, numbness in the hands or feet -problems with balance, talking, walking -trouble passing urine or change in the amount of urine Side effects that usually do not require medical attention (report to your doctor or health care professional if they continue or are bothersome): -hair loss -loss of appetite -metallic taste in the mouth or changes in taste This list may not describe all possible side effects. Call your doctor for medical advice about side effects. You may report side effects to FDA at 1-800-FDA-1088. Where should I keep my medicine? This drug is given in a hospital or clinic and will not be stored at home. NOTE: This sheet is a summary. It may not cover all possible information. If you have questions about this medicine, talk to your doctor, pharmacist, or health care provider.  2019 Elsevier/Gold Standard (2007-07-22 14:38:05)

## 2018-08-22 ENCOUNTER — Encounter: Payer: Self-pay | Admitting: Hematology and Oncology

## 2018-08-24 ENCOUNTER — Encounter: Payer: Self-pay | Admitting: Hematology and Oncology

## 2018-08-25 ENCOUNTER — Inpatient Hospital Stay (HOSPITAL_BASED_OUTPATIENT_CLINIC_OR_DEPARTMENT_OTHER): Payer: Managed Care, Other (non HMO) | Admitting: Medical

## 2018-08-25 ENCOUNTER — Other Ambulatory Visit: Payer: Self-pay

## 2018-08-25 ENCOUNTER — Inpatient Hospital Stay: Payer: Managed Care, Other (non HMO)

## 2018-08-25 ENCOUNTER — Telehealth: Payer: Self-pay | Admitting: *Deleted

## 2018-08-25 ENCOUNTER — Other Ambulatory Visit: Payer: Self-pay | Admitting: *Deleted

## 2018-08-25 VITALS — BP 126/73 | HR 102 | Temp 99.5°F | Resp 18 | Ht 65.0 in | Wt 144.1 lb

## 2018-08-25 DIAGNOSIS — Z95828 Presence of other vascular implants and grafts: Secondary | ICD-10-CM

## 2018-08-25 DIAGNOSIS — C50411 Malignant neoplasm of upper-outer quadrant of right female breast: Secondary | ICD-10-CM | POA: Diagnosis not present

## 2018-08-25 DIAGNOSIS — Z171 Estrogen receptor negative status [ER-]: Principal | ICD-10-CM

## 2018-08-25 DIAGNOSIS — K1230 Oral mucositis (ulcerative), unspecified: Secondary | ICD-10-CM

## 2018-08-25 DIAGNOSIS — G620 Drug-induced polyneuropathy: Secondary | ICD-10-CM | POA: Diagnosis not present

## 2018-08-25 DIAGNOSIS — R509 Fever, unspecified: Secondary | ICD-10-CM

## 2018-08-25 DIAGNOSIS — Z5111 Encounter for antineoplastic chemotherapy: Secondary | ICD-10-CM | POA: Diagnosis not present

## 2018-08-25 LAB — CMP (CANCER CENTER ONLY)
ALT: 57 U/L — ABNORMAL HIGH (ref 0–44)
AST: 46 U/L — ABNORMAL HIGH (ref 15–41)
Albumin: 4 g/dL (ref 3.5–5.0)
Alkaline Phosphatase: 80 U/L (ref 38–126)
Anion gap: 10 (ref 5–15)
BUN: 10 mg/dL (ref 6–20)
CO2: 23 mmol/L (ref 22–32)
Calcium: 9.2 mg/dL (ref 8.9–10.3)
Chloride: 106 mmol/L (ref 98–111)
Creatinine: 0.85 mg/dL (ref 0.44–1.00)
GFR, Est AFR Am: >60 mL/min (ref >60)
GFR, Estimated: >60 mL/min (ref >60)
Glucose, Bld: 96 mg/dL (ref 70–99)
Potassium: 3.9 mmol/L (ref 3.5–5.1)
Sodium: 139 mmol/L (ref 135–145)
Total Bilirubin: 0.4 mg/dL (ref 0.3–1.2)
Total Protein: 6.7 g/dL (ref 6.5–8.1)

## 2018-08-25 LAB — CBC WITH DIFFERENTIAL (CANCER CENTER ONLY)
Abs Immature Granulocytes: 0.01 10*3/uL (ref 0.00–0.07)
Basophils Absolute: 0 10*3/uL (ref 0.0–0.1)
Basophils Relative: 1 %
Eosinophils Absolute: 0.1 10*3/uL (ref 0.0–0.5)
Eosinophils Relative: 3 %
HCT: 28 % — ABNORMAL LOW (ref 36.0–46.0)
Hemoglobin: 9.2 g/dL — ABNORMAL LOW (ref 12.0–15.0)
Immature Granulocytes: 0 %
Lymphocytes Relative: 11 %
Lymphs Abs: 0.4 10*3/uL — ABNORMAL LOW (ref 0.7–4.0)
MCH: 31.4 pg (ref 26.0–34.0)
MCHC: 32.9 g/dL (ref 30.0–36.0)
MCV: 95.6 fL (ref 80.0–100.0)
Monocytes Absolute: 0.5 10*3/uL (ref 0.1–1.0)
Monocytes Relative: 14 %
Neutro Abs: 2.7 10*3/uL (ref 1.7–7.7)
Neutrophils Relative %: 71 %
Platelet Count: 152 10*3/uL (ref 150–400)
RBC: 2.93 MIL/uL — ABNORMAL LOW (ref 3.87–5.11)
RDW: 17 % — ABNORMAL HIGH (ref 11.5–15.5)
WBC Count: 3.7 10*3/uL — ABNORMAL LOW (ref 4.0–10.5)
nRBC: 0 % (ref 0.0–0.2)

## 2018-08-25 MED ORDER — LEVOFLOXACIN 500 MG PO TABS: 500.0000 mg | ORAL_TABLET | Freq: Every day | ORAL | 0 refills | Status: DC

## 2018-08-25 MED ORDER — LIDOCAINE VISCOUS HCL 2 % MT SOLN
15.0000 mL | Freq: Once | OROMUCOSAL | Status: AC
  Administered 2018-08-25: 14:00:00 15 mL via ORAL
  Filled 2018-08-25: qty 15

## 2018-08-25 MED ORDER — LEVOFLOXACIN IN D5W 500 MG/100ML IV SOLN
500.0000 mg | Freq: Once | INTRAVENOUS | Status: AC
  Administered 2018-08-25: 500 mg via INTRAVENOUS
  Filled 2018-08-25: qty 100

## 2018-08-25 MED ORDER — ALUM & MAG HYDROXIDE-SIMETH 200-200-20 MG/5ML PO SUSP
ORAL | Status: AC
  Filled 2018-08-25: qty 30

## 2018-08-25 MED ORDER — SODIUM CHLORIDE 0.9 % IV SOLN
Freq: Once | INTRAVENOUS | Status: AC
  Administered 2018-08-25: 14:00:00 via INTRAVENOUS
  Filled 2018-08-25: qty 250

## 2018-08-25 MED ORDER — HEPARIN SOD (PORK) LOCK FLUSH 100 UNIT/ML IV SOLN
500.0000 [IU] | Freq: Once | INTRAVENOUS | Status: AC
  Administered 2018-08-25: 500 [IU]
  Filled 2018-08-25: qty 5

## 2018-08-25 MED ORDER — MAGIC MOUTHWASH: 5.0000 mL | Freq: Four times a day (QID) | ORAL | 2 refills | Status: DC | PRN

## 2018-08-25 MED ORDER — GABAPENTIN 300 MG PO CAPS: 300.0000 mg | ORAL_CAPSULE | Freq: Every day | ORAL | 3 refills | Status: DC

## 2018-08-25 MED ORDER — SODIUM CHLORIDE 0.9% FLUSH
10.0000 mL | Freq: Once | INTRAVENOUS | Status: AC
  Administered 2018-08-25: 10 mL
  Filled 2018-08-25: qty 10

## 2018-08-25 MED ORDER — LIDOCAINE VISCOUS HCL 2 % MT SOLN: 15.0000 mL | OROMUCOSAL | 2 refills | Status: DC | PRN

## 2018-08-25 MED ORDER — ALUM & MAG HYDROXIDE-SIMETH 200-200-20 MG/5ML PO SUSP
30.0000 mL | Freq: Once | ORAL | Status: AC
  Administered 2018-08-25: 30 mL via ORAL

## 2018-08-25 NOTE — Patient Instructions (Signed)
Dehydration, Adult  Dehydration is a condition in which there is not enough fluid or water in the body. This happens when you lose more fluids than you take in. Important organs, such as the kidneys, brain, and heart, cannot function without a proper amount of fluids. Any loss of fluids from the body can lead to dehydration. Dehydration can range from mild to severe. This condition should be treated right away to prevent it from becoming severe. What are the causes? This condition may be caused by:  Vomiting.  Diarrhea.  Excessive sweating, such as from heat exposure or exercise.  Not drinking enough fluid, especially: ? When ill. ? While doing activity that requires a lot of energy.  Excessive urination.  Fever.  Infection.  Certain medicines, such as medicines that cause the body to lose excess fluid (diuretics).  Inability to access safe drinking water.  Reduced physical ability to get adequate water and food. What increases the risk? This condition is more likely to develop in people:  Who have a poorly controlled long-term (chronic) illness, such as diabetes, heart disease, or kidney disease.  Who are age 65 or older.  Who are disabled.  Who live in a place with high altitude.  Who play endurance sports. What are the signs or symptoms? Symptoms of mild dehydration may include:  Thirst.  Dry lips.  Slightly dry mouth.  Dry, warm skin.  Dizziness. Symptoms of moderate dehydration may include:  Very dry mouth.  Muscle cramps.  Dark urine. Urine may be the color of tea.  Decreased urine production.  Decreased tear production.  Heartbeat that is irregular or faster than normal (palpitations).  Headache.  Light-headedness, especially when you stand up from a sitting position.  Fainting (syncope). Symptoms of severe dehydration may include:  Changes in skin, such as: ? Cold and clammy skin. ? Blotchy (mottled) or pale skin. ? Skin that does  not quickly return to normal after being lightly pinched and released (poor skin turgor).  Changes in body fluids, such as: ? Extreme thirst. ? No tear production. ? Inability to sweat when body temperature is high, such as in hot weather. ? Very little urine production.  Changes in vital signs, such as: ? Weak pulse. ? Pulse that is more than 100 beats a minute when sitting still. ? Rapid breathing. ? Low blood pressure.  Other changes, such as: ? Sunken eyes. ? Cold hands and feet. ? Confusion. ? Lack of energy (lethargy). ? Difficulty waking up from sleep. ? Short-term weight loss. ? Unconsciousness. How is this diagnosed? This condition is diagnosed based on your symptoms and a physical exam. Blood and urine tests may be done to help confirm the diagnosis. How is this treated? Treatment for this condition depends on the severity. Mild or moderate dehydration can often be treated at home. Treatment should be started right away. Do not wait until dehydration becomes severe. Severe dehydration is an emergency and it needs to be treated in a hospital. Treatment for mild dehydration may include:  Drinking more fluids.  Replacing salts and minerals in your blood (electrolytes) that you may have lost. Treatment for moderate dehydration may include:  Drinking an oral rehydration solution (ORS). This is a drink that helps you replace fluids and electrolytes (rehydrate). It can be found at pharmacies and retail stores. Treatment for severe dehydration may include:  Receiving fluids through an IV tube.  Receiving an electrolyte solution through a feeding tube that is passed through your nose and   into your stomach (nasogastric tube, or NG tube).  Correcting any abnormalities in electrolytes.  Treating the underlying cause of dehydration. Follow these instructions at home:  If directed by your health care provider, drink an ORS: ? Make an ORS by following instructions on the  package. ? Start by drinking small amounts, about  cup (120 mL) every 5-10 minutes. ? Slowly increase how much you drink until you have taken the amount recommended by your health care provider.  Drink enough clear fluid to keep your urine clear or pale yellow. If you were told to drink an ORS, finish the ORS first, then start slowly drinking other clear fluids. Drink fluids such as: ? Water. Do not drink only water. Doing that can lead to having too little salt (sodium) in the body (hyponatremia). ? Ice chips. ? Fruit juice that you have added water to (diluted fruit juice). ? Low-calorie sports drinks.  Avoid: ? Alcohol. ? Drinks that contain a lot of sugar. These include high-calorie sports drinks, fruit juice that is not diluted, and soda. ? Caffeine. ? Foods that are greasy or contain a lot of fat or sugar.  Take over-the-counter and prescription medicines only as told by your health care provider.  Do not take sodium tablets. This can lead to having too much sodium in the body (hypernatremia).  Eat foods that contain a healthy balance of electrolytes, such as bananas, oranges, potatoes, tomatoes, and spinach.  Keep all follow-up visits as told by your health care provider. This is important. Contact a health care provider if:  You have abdominal pain that: ? Gets worse. ? Stays in one area (localizes).  You have a rash.  You have a stiff neck.  You are more irritable than usual.  You are sleepier or more difficult to wake up than usual.  You feel weak or dizzy.  You feel very thirsty.  You have urinated only a small amount of very dark urine over 6-8 hours. Get help right away if:  You have symptoms of severe dehydration.  You cannot drink fluids without vomiting.  Your symptoms get worse with treatment.  You have a fever.  You have a severe headache.  You have vomiting or diarrhea that: ? Gets worse. ? Does not go away.  You have blood or green matter  (bile) in your vomit.  You have blood in your stool. This may cause stool to look black and tarry.  You have not urinated in 6-8 hours.  You faint.  Your heart rate while sitting still is over 100 beats a minute.  You have trouble breathing. This information is not intended to replace advice given to you by your health care provider. Make sure you discuss any questions you have with your health care provider. Document Released: 04/16/2005 Document Revised: 11/11/2015 Document Reviewed: 06/10/2015 [employer] Interactive Patient Education  2019 [employer]  

## 2018-08-25 NOTE — Progress Notes (Signed)
[facility] Progress Note   Katrina Liu [id number] 10-23-1960 58 y.o.  Katrina Liu is managed by Dr. Nicholas Lose  Actively treated with chemotherapy/immunotherapy/hormonal therapy: yes  Current therapy: Taxol  Last treated: 08/19/2018 (cycle 2) She is scheduled for her next cycle of chemotherapy on 08/26/2018.  Next scheduled appointment with provider: 09/03/2018.  Assessment: Plan:    Malignant neoplasm of upper-outer quadrant of right breast in female, estrogen receptor negative ([place]) - Plan: heparin lock flush 100 unit/mL, sodium chloride flush (NS) 0.9 % injection 10 mL  Mucositis oral - Plan: levofloxacin (LEVAQUIN) IVPB 500 mg, alum & mag hydroxide-simeth (MAALOX/MYLANTA) 200-200-20 MG/5ML suspension 30 mL, lidocaine (XYLOCAINE) 2 % viscous mouth solution 15 mL, 0.9 %  sodium chloride infusion, levofloxacin (LEVAQUIN) 500 MG tablet, magic mouthwash SOLN, lidocaine (XYLOCAINE) 2 % solution  Port-A-Cath in place - Plan: heparin lock flush 100 unit/mL, sodium chloride flush (NS) 0.9 % injection 10 mL  Drug-induced polyneuropathy ([facility]) - Plan: gabapentin (NEURONTIN) 300 MG capsule  Fever, unspecified fever cause - Plan: levofloxacin (LEVAQUIN) IVPB 500 mg, levofloxacin (LEVAQUIN) 500 MG tablet   ER negative malignant neoplasm of the right breast: The patient continues to be managed by Dr. Nicholas Lose and is status post cycle 2 of Taxol which was dosed on 08/19/2018.  Chemotherapy scheduled for tomorrow will be delayed by 1 week.  Mucositis and low-grade fever: The patient reports that she had a temperature of 100.3 last evening.  Today her temperature is 99.5.  No cultures were collected today.  Given that the patient reported a low-grade fever and given that she is experiencing mucositis nasal membrane interruption she was given Levaquin 500 mg IV x1 and was given a prescription for Levaquin 500 mg once daily which she will begin tomorrow for 7 days.    Peripheral neuropathy of the fingertips: The patient was given a prescription for gabapentin 300 mg p.o. nightly.  Please see After Visit Summary for patient specific instructions.  Future Appointments  Date Time Provider [place]  09/03/2018  9:15 AM [facility] LAB 6 [facility] None  09/03/2018  9:45 AM [facility] [place] FLUSH [facility] None  09/03/2018 10:15 AM Nicholas Lose, MD [facility] None  09/03/2018 11:15 AM [facility] INFUSION [facility] None  09/09/2018 12:15 PM [facility] LAB 5 [facility] None  09/09/2018 12:30 PM [facility] [place] FLUSH [facility] None  09/09/2018  1:30 PM [facility] INFUSION [facility] None  09/16/2018  1:00 PM [facility] LAB 2 [facility] None  09/16/2018  1:15 PM [facility] [place] FLUSH [facility] None  09/16/2018  1:45 PM Nicholas Lose, MD [facility] None  09/16/2018  2:30 PM [facility] INFUSION [facility] None  09/23/2018 11:00 AM [facility] LAB 1 [facility] None  09/23/2018 11:15 AM [facility] [place] FLUSH [facility] None  09/23/2018 12:15 PM [facility] INFUSION [facility] None    No orders of the defined types were placed in this encounter.      Subjective:   Patient ID:  Katrina Liu is a 58 y.o. (DOB 02-05-1961) female.  Chief Complaint:  Chief Complaint  Patient presents with  . Mucositis    HPI DELECIA Liu is a 58 year old female with a diagnosis of an ER negative malignant neoplasm of the right breast: The patient continues to be managed by Dr. Nicholas Lose and is status post cycle 2 of Taxol which was dosed on 08/19/2018.  She had a temperature of 100.3 last evening.  Today her temperature is 99.5. She is experiencing mucositis and nasal membrane interruption.  She had  bumps develop in her nares this weekend which she ruptured and which drained purulent material.  They are now crusting.  She is having significant mouth pain and difficulty swallowing due to her mucositis.  She also reports that she is having peripheral neuropathy of her  fingertips.  She was scheduled to have chemotherapy tomorrow.  Medications: I have reviewed the patient's current medications.  Allergies:  Allergies  Allergen Reactions  . Lisinopril Swelling    Past Medical History:  Diagnosis Date  . Anemia   . Family history of prostate cancer   . GERD (gastroesophageal reflux disease)    h/o - not now  . Headache(784.0)   . Hypertension   . S/P abdominal hysterectomy 04/17/2011  . Stroke [facility])     Past Surgical History:  Procedure Laterality Date  . ABDOMINAL HYSTERECTOMY  04/16/2011   Procedure: HYSTERECTOMY ABDOMINAL;  Surgeon: Marvene Staff, MD;  Location: [facility];  Service: Gynecology;  Laterality: N/A;  . BREAST BIOPSY Left 2013  . KIDNEY STONE SURGERY    . LAPAROTOMY     ectopic pregnancy  . LOOP RECORDER IMPLANT N/A 12/24/2012   Procedure: LOOP RECORDER IMPLANT;  Surgeon: Deboraha Sprang, MD;  Location: [facility] CATH LAB;  Service: Cardiovascular;  Laterality: N/A;  . LOOP RECORDER REMOVAL N/A 08/15/2016   Procedure: Loop Recorder Removal;  Surgeon: Deboraha Sprang, MD;  Location: [facility];  Service: Cardiovascular;  Laterality: N/A;  . SHOULDER ARTHROSCOPY    . TEE WITHOUT CARDIOVERSION N/A 12/24/2012   Procedure: TRANSESOPHAGEAL ECHOCARDIOGRAM (TEE);  Surgeon: Jettie Booze, MD;  Location: [facility] ENDOSCOPY;  Service: Cardiovascular;  Laterality: N/A;    Family History  Problem Relation Age of Onset  . Stroke Father 41  . Lung cancer Father   . Prostate cancer Paternal Uncle        dx 28's?  . Diabetes Maternal Grandfather   . Breast cancer Neg Hx     Social History   Socioeconomic History  . Marital status: Divorced    Spouse name: Not on file  . Number of children: 1  . Years of education: 43  . Highest education level: Not on file  Occupational History  . Occupation: [employer]    Employer: [employer]  Social Needs  . Financial resource strain: Not on file  . Food insecurity:     Worry: Not on file    Inability: Not on file  . Transportation needs:    Medical: Not on file    Non-medical: Not on file  Tobacco Use  . Smoking status: Never Smoker  . Smokeless tobacco: Never Used  Substance and Sexual Activity  . Alcohol use: No    Alcohol/week: 0.0 standard drinks  . Drug use: No  . Sexual activity: Not on file  Lifestyle  . Physical activity:    Days per week: Not on file    Minutes per session: Not on file  . Stress: Not on file  Relationships  . Social connections:    Talks on phone: Not on file    Gets together: Not on file    Attends religious service: Not on file    Active member of club or organization: Not on file    Attends meetings of clubs or organizations: Not on file    Relationship status: Not on file  . Intimate partner violence:    Fear of current or ex partner: Not on file    Emotionally abused:  Not on file    Physically abused: Not on file    Forced sexual activity: Not on file  Other Topics Concern  . Not on file  Social History Narrative   Patient is single with one child.   Patient is left handed.   Patient has hs education.   Patient drinks 1/2 cup daily.    Past Medical History, Surgical history, Social history, and Family history were reviewed and updated as appropriate.   Please see review of systems for further details on the patient's review from today.   Review of Systems:  Review of Systems  Constitutional: Positive for appetite change, fatigue and fever. Negative for chills and diaphoresis.  HENT: Positive for mouth sores and trouble swallowing. Negative for sore throat.        Pain and crusting in the nares.  Respiratory: Negative for cough, shortness of breath and wheezing.   Cardiovascular: Negative for chest pain and palpitations.  Gastrointestinal: Negative for constipation, diarrhea, nausea and vomiting.    Objective:   Physical Exam:  BP 126/73 (BP Location: Right Arm, Patient Position: Sitting)    Pulse (!) 102 Comment: Katrina Liu is aware  Temp 99.5 F (37.5 C) (Oral)   Resp 18   Ht 5\' 5"  (1.651 m)   Wt 144 lb 1.6 oz (65.4 kg)   LMP 04/16/2011   SpO2 100%   BMI 23.98 kg/m  ECOG: 1  Physical Exam Constitutional:      General: She is not in acute distress.    Appearance: She is not toxic-appearing or diaphoretic.  HENT:     Head: Normocephalic and atraumatic.     Mouth/Throat:     Mouth: Mucous membranes are moist.     Pharynx: Posterior oropharyngeal erythema present. No oropharyngeal exudate.     Comments: Multiple ulcerations of the bilateral tongue and bilateral buccal membranes. Eyes:     General: No scleral icterus.       Right eye: No discharge.        Left eye: No discharge.  Cardiovascular:     Rate and Rhythm: Regular rhythm. Tachycardia present.     Heart sounds: Normal heart sounds. No murmur. No friction rub. No gallop.   Pulmonary:     Effort: Pulmonary effort is normal. No respiratory distress.     Breath sounds: Normal breath sounds. No wheezing or rales.  Skin:    General: Skin is warm and dry.  Neurological:     Mental Status: She is alert.     Gait: Gait normal.  Psychiatric:        Mood and Affect: Mood normal.        Behavior: Behavior normal.        Thought Content: Thought content normal.        Judgment: Judgment normal.     Lab Review:     Component Value Date/Time   NA 139 08/25/2018 1239   K 3.9 08/25/2018 1239   CL 106 08/25/2018 1239   CO2 23 08/25/2018 1239   GLUCOSE 96 08/25/2018 1239   BUN 10 08/25/2018 1239   CREATININE 0.85 08/25/2018 1239   CALCIUM 9.2 08/25/2018 1239   PROT 6.7 08/25/2018 1239   ALBUMIN 4.0 08/25/2018 1239   AST 46 (H) 08/25/2018 1239   ALT 57 (H) 08/25/2018 1239   ALKPHOS 80 08/25/2018 1239   BILITOT 0.4 08/25/2018 1239   GFRNONAA >60 08/25/2018 1239   GFRAA >60 08/25/2018 1239  Component Value Date/Time   WBC 3.7 (L) 08/25/2018 1239   WBC 6.0 12/22/2012 1005   RBC 2.93 (L) 08/25/2018  1239   HGB 9.2 (L) 08/25/2018 1239   HCT 28.0 (L) 08/25/2018 1239   PLT 152 08/25/2018 1239   MCV 95.6 08/25/2018 1239   MCH 31.4 08/25/2018 1239   MCHC 32.9 08/25/2018 1239   RDW 17.0 (H) 08/25/2018 1239   LYMPHSABS 0.4 (L) 08/25/2018 1239   MONOABS 0.5 08/25/2018 1239   EOSABS 0.1 08/25/2018 1239   BASOSABS 0.0 08/25/2018 1239   -------------------------------  Imaging from last 24 hours (if applicable):  Radiology interpretation: No results found.      This case was discussed with Dr. Lindi Adie. He expresses agreement with my management of this patient.

## 2018-08-25 NOTE — Telephone Encounter (Signed)
Devin from [place] called to report Dr. Benay Spice had called in [facility] for her this weekend. Their pharmacy is no longer allowed to compound medications, so will need to be called to another pharmacy of patient's choice. Notified collaborative nurse of Dr. Lindi Adie.

## 2018-08-25 NOTE — Progress Notes (Signed)
Pt reports feeling a bit better at end of infusion.  IVF stopped to hang IV abx, pt states she doesn't need IVF after abx finished b/c she's feeling better.  PA Lucianne Lei aware.  VSS.  Since Magic MW could not be filled by [facility] was re-sent in to pt's preferred [facility] by PA Lucianne Lei.  Per MD Gudena pt's infusion cancelled for 08/26/2018, pt VU and agreement to this.  Pt VU of d/c instructions and to f/u as needed.

## 2018-08-25 NOTE — Progress Notes (Signed)
Pt called this morning and also this weekend stating she had a fever as high as 100.3.  Pt states she took tylenol 500 mg and the fever resolved.  This morning her temperature was 98.8.  Pt also states she has been dizzy and weak and has attempt to drink more fluids but is having trouble because pt also states that she has sores in her mouth, on her tongue and in her nose that are extremely painful.  I spoke with Sandi Mealy, PA and he stated to have the pt come in today to be assessed by him.  High priority message sent to scheduling.

## 2018-08-26 ENCOUNTER — Inpatient Hospital Stay: Payer: Managed Care, Other (non HMO)

## 2018-08-26 ENCOUNTER — Encounter: Payer: Self-pay | Admitting: Hematology and Oncology

## 2018-08-26 ENCOUNTER — Encounter: Payer: Self-pay | Admitting: *Deleted

## 2018-08-28 ENCOUNTER — Encounter: Payer: Self-pay | Admitting: Medical

## 2018-08-29 ENCOUNTER — Telehealth: Payer: Self-pay | Admitting: Medical

## 2018-08-29 ENCOUNTER — Other Ambulatory Visit: Payer: Self-pay | Admitting: Medical

## 2018-08-29 MED ORDER — MUPIROCIN CALCIUM 2 % NA OINT: TOPICAL_OINTMENT | NASAL | 0 refills | Status: DC

## 2018-08-29 MED ORDER — TRIAMCINOLONE ACETONIDE 0.1 % EX CREA: TOPICAL_CREAM | CUTANEOUS | 1 refills | Status: DC

## 2018-08-29 MED ORDER — ACYCLOVIR 200 MG PO CAPS: 200.0000 mg | ORAL_CAPSULE | Freq: Every day | ORAL | 0 refills | Status: DC

## 2018-08-29 NOTE — Telephone Encounter (Signed)
Mucositis slowly improving. Nares lesions continue. Superficial lesions on the palms. Acyclovir, Bactrban and Triamcinolone prescriptions sent.

## 2018-08-31 ENCOUNTER — Encounter: Payer: Self-pay | Admitting: Medical

## 2018-09-01 ENCOUNTER — Telehealth: Payer: Self-pay | Admitting: *Deleted

## 2018-09-01 ENCOUNTER — Other Ambulatory Visit: Payer: Self-pay | Admitting: Medical

## 2018-09-01 DIAGNOSIS — K1231 Oral mucositis (ulcerative) due to antineoplastic therapy: Secondary | ICD-10-CM

## 2018-09-01 MED ORDER — NYSTATIN 100000 UNIT/ML MT SUSP: 5.0000 mL | Freq: Four times a day (QID) | OROMUCOSAL | 0 refills | Status: DC

## 2018-09-01 MED ORDER — OXYCODONE HCL 5 MG/5ML PO SOLN: ORAL | 0 refills | Status: DC

## 2018-09-01 MED ORDER — GELCLAIR MT GEL: 1.0000 | Freq: Three times a day (TID) | OROMUCOSAL | 2 refills | Status: DC

## 2018-09-01 NOTE — Telephone Encounter (Addendum)
"  [facility] [phone]).  We do not have Oxycodone solution in stock.  Unable to transfer order.  Provider send order to Krist[facility]en[facility], [facility] and notify me, I have to remove from our orders when new order sent."

## 2018-09-02 NOTE — Progress Notes (Signed)
Patient Care Team: [name], MD as PCP - General (Family Medicine) Excell Seltzer, MD as Consulting Physician (General Surgery) Nicholas Lose, MD as Consulting Physician (Hematology and Oncology) Kyung Rudd, MD as Consulting Physician (Radiation Oncology)  DIAGNOSIS:    ICD-10-CM   1. Malignant neoplasm of upper-outer quadrant of right breast in female, estrogen receptor negative ([place]) C50.411    Z17.1     SUMMARY OF ONCOLOGIC HISTORY:   Malignant neoplasm of upper-outer quadrant of right breast in female, estrogen receptor negative ([place])   05/29/2018 Initial Diagnosis    Right axillary pain which led to a mammogram and ultrasound that revealed a new mass in the superior UOQ tail of the breast 10 o'clock position by ultrasound measured 8 mm, axilla negative, biopsy revealed grade 3 IDC triple negative with a Ki-67 of 20%, T1BN0 stage Ib clinical stage    06/04/2018 Cancer Staging    Staging form: Breast, AJCC 8th Edition - Clinical stage from 06/04/2018: Stage IB (cT1b, cN0, cM0, G3, ER-, PR-, HER2-) - Signed by Nicholas Lose, MD on 06/04/2018    06/17/2018 -  Neo-Adjuvant Chemotherapy    Dose dense Adriamycin and Cytoxan x4 followed by Taxol and carboplatin    07/15/2018 Genetic Testing    No pathogenic variants identified (negative testing) on the [employer] CancerNext+RNAinsight panel. The CancerNext gene panel offered by [employer] includes sequencing and rearrangement analysis for the following 34 genes:   APC, ATM, BARD1, BMPR1A, BRCA1, BRCA2, BRIP1, CDH1, CDK4, CDKN2A, CHEK2, DICER1, EPCAM, GREM1, HOXB13MLH1, MRE11A, MSH2, MSH6, MUTYH, NBN, NF1, PALB2, PMS2, POLD1, POLE, PTEN, RAD50, RAD51D, RAD51C, SMAD4, SMARCA4, STK11, and TP53. The report date is 07/15/2018.     CHIEF COMPLIANT: Cycle 3 Taxol  INTERVAL HISTORY: Katrina Liu is a 58 y.o. with above-mentioned history of right breast cancer who completed 4 cycles of neoadjuvant chemotherapy with dose dense Adriamycin and  Cytoxan and presents to the clinic today for cycle 3 of Taxol. Treatment was cancelled last week due to a fever of 100.3, dizziness, weakness, and mouth sores.  The mouth sores were fairly extensive with extreme amount of pain and discomfort.  She was not even able to put liquids into her mouth.  She was treated with Magic mouthwash, nystatin swish and swallow, acyclovir.  she is slowly starting to get better.  However she still has profound difficulties with eating and swallowing with pain and swelling of the lips.  She is very anxious of the lips are going to get closed through the night and therefore she is trying not to sleep.  For the pain she was taking oxycodone liquid solution but it has not really helped her significantly.  Magic mouthwash relieves the discomfort for only 10 to 15 minutes.  REVIEW OF SYSTEMS:   Constitutional: Denies fevers, chills or abnormal weight loss Eyes: Denies blurriness of vision Ears, nose, mouth, throat, and face: Severe mucositis and mouth sores along with swelling and sores along the lips Respiratory: Denies cough, dyspnea or wheezes Cardiovascular: Denies palpitation, chest discomfort Gastrointestinal: Denies nausea, heartburn or change in bowel habits Skin: Denies abnormal skin rashes Lymphatics: Denies new lymphadenopathy or easy bruising Neurological: Denies numbness, tingling or new weaknesses Behavioral/Psych: Mood is stable, no new changes  Extremities: No lower extremity edema Breast: denies any pain or lumps or nodules in either breasts All other systems were reviewed with the patient and are negative.  I have reviewed the past medical history, past surgical history, social history and family history with  the patient and they are unchanged from previous note.  ALLERGIES:  is allergic to lisinopril.  MEDICATIONS:  Current Outpatient Medications  Medication Sig Dispense Refill   acyclovir (ZOVIRAX) 200 MG capsule Take 1 capsule (200 mg total) by  mouth 5 (five) times daily. 35 capsule 0   Ascorbic Acid (VITAMIN C) 500 MG CAPS Take 500 mg by mouth daily.      aspirin EC 81 MG EC tablet Take 1 tablet (81 mg total) by mouth daily. 90 tablet 0   Biotin 10000 MCG TABS Take 10,000 mcg by mouth daily.     Calcium Carbonate-Vitamin D (CALCIUM 500/D PO) Take by mouth.     chlorthalidone (HYGROTON) 25 MG tablet Take 1 tablet (25 mg total) by mouth daily. (Patient not taking: Reported on 06/04/2018) 90 tablet 2   Cyanocobalamin (VITAMIN B 12 PO) Take by mouth.     dexamethasone (DECADRON) 4 MG tablet Take 1 tablet (4 mg total) by mouth daily. Take 1 tablet day after chemo and 1 tablet 2 days after chemo with food (Patient not taking: Reported on 07/21/2018) 8 tablet 0   dicyclomine (BENTYL) 10 MG capsule Take 1 capsule (10 mg total) by mouth 2 (two) times daily as needed for spasms. (Patient not taking: Reported on 07/21/2018) 30 capsule 1   Flaxseed, Linseed, (FLAX SEED OIL) 1000 MG CAPS Take 1,000 mg by mouth once a week.     gabapentin (NEURONTIN) 300 MG capsule Take 1 capsule (300 mg total) by mouth at bedtime. 30 capsule 3   hydrochlorothiazide (HYDRODIURIL) 25 MG tablet Take 25 mg by mouth daily.     HYDROcodone-Acetaminophen (LORTAB) 10-300 MG/15ML SOLN Take 5 mLs by mouth every 6 (six) hours as needed. Do not drive, work, or operate machinery while taking this medication (Patient not taking: Reported on 06/04/2018) 100 mL 0   labetalol (NORMODYNE) 100 MG tablet Take 1 tablet (100 mg total) by mouth 2 (two) times daily. 180 tablet 3   levofloxacin (LEVAQUIN) 500 MG tablet Take 1 tablet (500 mg total) by mouth daily. 7 tablet 0   lidocaine (XYLOCAINE) 2 % solution Use as directed 15 mLs in the mouth or throat every 3 (three) hours as needed for mouth pain. 200 mL 2   lidocaine-prilocaine (EMLA) cream Apply to affected area once 30 g 3   LORazepam (ATIVAN) 0.5 MG tablet Take 1 tablet (0.5 mg total) by mouth at bedtime as needed (Nausea  or vomiting). (Patient not taking: Reported on 07/21/2018) 30 tablet 0   magic mouthwash SOLN Take 5 mLs by mouth 4 (four) times daily as needed for mouth pain. Swish and swallow or spit 240 mL 2   mometasone (ELOCON) 0.1 % ointment Apply 1 application topically as needed (dry skin).      mucosal barrier oral (GELCLAIR) GEL Take 1 packet by mouth 3 (three) times daily. 30 packet 2   Multiple Vitamin (MULTIVITAMIN) tablet Take 1 tablet by mouth daily.     mupirocin nasal ointment (BACTROBAN NASAL) 2 % Use cotton swabs and apply to inside of nostrils twice daily for 7 days 20 g 0   nystatin (MYCOSTATIN) 100000 UNIT/ML suspension Take 5 mLs (500,000 Units total) by mouth 4 (four) times daily. 150 mL 0   ondansetron (ZOFRAN) 8 MG tablet Take 1 tablet (8 mg total) by mouth 2 (two) times daily as needed. Start on the third day after chemotherapy. 30 tablet 1   oxyCODONE (ROXICODONE) 5 MG/5ML solution 5 to 10 mg  Q 4 hours prn pain 240 mL 0   potassium chloride SA (K-DUR,KLOR-CON) 20 MEQ tablet Take 1 tablet (20 mEq total) by mouth daily. 30 tablet 2   prochlorperazine (COMPAZINE) 10 MG tablet Take 1 tablet (10 mg total) by mouth every 6 (six) hours as needed (Nausea or vomiting). 30 tablet 1   triamcinolone cream (KENALOG) 0.1 % Apply to hands TID prn for rash 80 g 1   Turmeric 500 MG TABS Take 500 mg by mouth daily.     zinc gluconate 50 MG tablet Take 50 mg by mouth daily.     No current facility-administered medications for this visit.     PHYSICAL EXAMINATION: ECOG PERFORMANCE STATUS: 1 - Symptomatic but completely ambulatory  Vitals:   09/03/18 1011  BP: 119/68  Pulse: 92  Resp: 17  Temp: 97.6 F (36.4 C)  SpO2: 100%   Filed Weights   09/03/18 1011  Weight: 139 lb 3.2 oz (63.1 kg)    GENERAL: alert, no distress and comfortable SKIN: skin color, texture, turgor are normal, no rashes or significant lesions EYES: normal, Conjunctiva are pink and non-injected, sclera  clear OROPHARYNX: no exudate, no erythema and lips, buccal mucosa, and tongue normal  NECK: supple, thyroid normal size, non-tender, without nodularity LYMPH: no palpable lymphadenopathy in the cervical, axillary or inguinal LUNGS: clear to auscultation and percussion with normal breathing effort HEART: regular rate & rhythm and no murmurs and no lower extremity edema ABDOMEN: abdomen soft, non-tender and normal bowel sounds MUSCULOSKELETAL: no cyanosis of digits and no clubbing  NEURO: alert & oriented x 3 with fluent speech, no focal motor/sensory deficits EXTREMITIES: No lower extremity edema  LABORATORY DATA:  I have reviewed the data as listed CMP Latest Ref Rng & Units 08/25/2018 08/19/2018 08/12/2018  Glucose 70 - 99 mg/dL 96 82 88  BUN 6 - 20 mg/dL '10 12 8  ' Creatinine 0.44 - 1.00 mg/dL 0.85 0.75 0.84  Sodium 135 - 145 mmol/L 139 142 142  Potassium 3.5 - 5.1 mmol/L 3.9 4.5 4.1  Chloride 98 - 111 mmol/L 106 109 109  CO2 22 - 32 mmol/L '23 25 23  ' Calcium 8.9 - 10.3 mg/dL 9.2 8.9 9.0  Total Protein 6.5 - 8.1 g/dL 6.7 6.3(L) 6.5  Total Bilirubin 0.3 - 1.2 mg/dL 0.4 0.4 0.2(L)  Alkaline Phos 38 - 126 U/L 80 85 116  AST 15 - 41 U/L 46(H) 15 13(L)  ALT 0 - 44 U/L 57(H) 12 8    Lab Results  Component Value Date   WBC 3.9 (L) 09/03/2018   HGB 8.5 (L) 09/03/2018   HCT 26.1 (L) 09/03/2018   MCV 92.2 09/03/2018   PLT 315 09/03/2018   NEUTROABS 2.3 09/03/2018    ASSESSMENT & PLAN:  Malignant neoplasm of upper-outer quadrant of right breast in female, estrogen receptor negative ([facility]) 05/29/2018:Right axillary pain which led to a mammogram and ultrasound that revealed a new mass in the superior UOQ tail of the breast 10 o'clock position by ultrasound measured 8 mm, axilla negative, 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 Neoadjuvant chemotherapy with Adriamycin and Cytoxan dose dense 4 followed byTaxolweekly  12and carboplatin every 3 weeks 2. Followed by breast conserving surgery with sentinel lymph node study  3. Followed by adjuvant radiation therapy Genetics consultation is happening this week Echocardiogram 06/10/2018: EF greater than 65% Breast MRI 06/09/2018: 1.1 cm enhancing mass UOQ right breast ----------------------------------------------------------------------------------------------------------------------------------  Current treatment: Completed 4 cycles ofdose dense Adriamycin and Cytoxan, Taxol x2 doses were given and further treatment discontinued for severe mucositis  Chemo toxicities: 1.Fatigue  2. Alopecia 3.Chemotherapy-induced anemia hemoglobin9.9 monitoring closely 4.Hypokalemia: On oral potassium replacement  Extremely severe mucositis with ulcers all through the mouth and lips: Patient was given acyclovir and nystatin along with Magic mouthwash and oral pain medications and in spite of that her symptoms are worse pretty severe.  They are slowly getting better.  Today she is able to talk without any trouble.  This all started right after the Taxol chemo.  Because of all of these we will discontinue further chemotherapy. I will request a breast MRI to be done next week. I will request Dr. Excell Seltzer to see the patient after the breast MRI and also for her to be presented in the breast tumor board. Labs have been reviewed  She will not receive chemo but she will receive IV fluids 1 L normal saline today.  Return to clinic weekly for Taxol and every other week for follow-up with me  No orders of the defined types were placed in this encounter.  The patient has a good understanding of the overall plan. she agrees with it. she will call with any problems that may develop before the next visit here.  Nicholas Lose, MD 09/03/2018  Julious Oka Dorshimer am acting as scribe for Dr. Nicholas Lose.  I have reviewed the above documentation for accuracy and completeness, and  I agree with the above.

## 2018-09-03 ENCOUNTER — Other Ambulatory Visit: Payer: Self-pay | Admitting: *Deleted

## 2018-09-03 ENCOUNTER — Inpatient Hospital Stay: Payer: Managed Care, Other (non HMO)

## 2018-09-03 ENCOUNTER — Inpatient Hospital Stay (HOSPITAL_BASED_OUTPATIENT_CLINIC_OR_DEPARTMENT_OTHER): Payer: Managed Care, Other (non HMO) | Admitting: Hematology and Oncology

## 2018-09-03 ENCOUNTER — Other Ambulatory Visit: Payer: Self-pay

## 2018-09-03 ENCOUNTER — Encounter: Payer: Self-pay | Admitting: *Deleted

## 2018-09-03 ENCOUNTER — Inpatient Hospital Stay: Payer: Managed Care, Other (non HMO) | Attending: Hematology and Oncology

## 2018-09-03 DIAGNOSIS — C50411 Malignant neoplasm of upper-outer quadrant of right female breast: Secondary | ICD-10-CM | POA: Diagnosis present

## 2018-09-03 DIAGNOSIS — T451X5A Adverse effect of antineoplastic and immunosuppressive drugs, initial encounter: Secondary | ICD-10-CM | POA: Insufficient documentation

## 2018-09-03 DIAGNOSIS — Z171 Estrogen receptor negative status [ER-]: Secondary | ICD-10-CM | POA: Insufficient documentation

## 2018-09-03 DIAGNOSIS — R609 Edema, unspecified: Secondary | ICD-10-CM

## 2018-09-03 DIAGNOSIS — Z95828 Presence of other vascular implants and grafts: Secondary | ICD-10-CM

## 2018-09-03 DIAGNOSIS — R530 Neoplastic (malignant) related fatigue: Secondary | ICD-10-CM

## 2018-09-03 DIAGNOSIS — K1231 Oral mucositis (ulcerative) due to antineoplastic therapy: Secondary | ICD-10-CM | POA: Diagnosis not present

## 2018-09-03 DIAGNOSIS — R1310 Dysphagia, unspecified: Secondary | ICD-10-CM

## 2018-09-03 DIAGNOSIS — K1230 Oral mucositis (ulcerative), unspecified: Secondary | ICD-10-CM

## 2018-09-03 DIAGNOSIS — L658 Other specified nonscarring hair loss: Secondary | ICD-10-CM | POA: Insufficient documentation

## 2018-09-03 DIAGNOSIS — Z9221 Personal history of antineoplastic chemotherapy: Secondary | ICD-10-CM | POA: Insufficient documentation

## 2018-09-03 DIAGNOSIS — D6481 Anemia due to antineoplastic chemotherapy: Secondary | ICD-10-CM | POA: Insufficient documentation

## 2018-09-03 DIAGNOSIS — E876 Hypokalemia: Secondary | ICD-10-CM | POA: Diagnosis not present

## 2018-09-03 LAB — CBC WITH DIFFERENTIAL (CANCER CENTER ONLY)
Abs Immature Granulocytes: 0 10*3/uL (ref 0.00–0.07)
Basophils Absolute: 0 10*3/uL (ref 0.0–0.1)
Basophils Relative: 1 %
Eosinophils Absolute: 0.2 10*3/uL (ref 0.0–0.5)
Eosinophils Relative: 6 %
HCT: 26.1 % — ABNORMAL LOW (ref 36.0–46.0)
Hemoglobin: 8.5 g/dL — ABNORMAL LOW (ref 12.0–15.0)
Immature Granulocytes: 0 %
Lymphocytes Relative: 17 %
Lymphs Abs: 0.6 10*3/uL — ABNORMAL LOW (ref 0.7–4.0)
MCH: 30 pg (ref 26.0–34.0)
MCHC: 32.6 g/dL (ref 30.0–36.0)
MCV: 92.2 fL (ref 80.0–100.0)
Monocytes Absolute: 0.7 10*3/uL (ref 0.1–1.0)
Monocytes Relative: 18 %
Neutro Abs: 2.3 10*3/uL (ref 1.7–7.7)
Neutrophils Relative %: 58 %
Platelet Count: 315 10*3/uL (ref 150–400)
RBC: 2.83 MIL/uL — ABNORMAL LOW (ref 3.87–5.11)
RDW: 16.7 % — ABNORMAL HIGH (ref 11.5–15.5)
WBC Count: 3.9 10*3/uL — ABNORMAL LOW (ref 4.0–10.5)
nRBC: 0 % (ref 0.0–0.2)

## 2018-09-03 LAB — CMP (CANCER CENTER ONLY)
ALT: 14 U/L (ref 0–44)
AST: 13 U/L — ABNORMAL LOW (ref 15–41)
Albumin: 3.4 g/dL — ABNORMAL LOW (ref 3.5–5.0)
Alkaline Phosphatase: 68 U/L (ref 38–126)
Anion gap: 8 (ref 5–15)
BUN: 9 mg/dL (ref 6–20)
CO2: 25 mmol/L (ref 22–32)
Calcium: 9.1 mg/dL (ref 8.9–10.3)
Chloride: 105 mmol/L (ref 98–111)
Creatinine: 0.82 mg/dL (ref 0.44–1.00)
GFR, Est AFR Am: >60 mL/min (ref >60)
GFR, Estimated: >60 mL/min (ref >60)
Glucose, Bld: 110 mg/dL — ABNORMAL HIGH (ref 70–99)
Potassium: 4 mmol/L (ref 3.5–5.1)
Sodium: 138 mmol/L (ref 135–145)
Total Bilirubin: 0.3 mg/dL (ref 0.3–1.2)
Total Protein: 6.4 g/dL — ABNORMAL LOW (ref 6.5–8.1)

## 2018-09-03 MED ORDER — SODIUM CHLORIDE 0.9 % IV SOLN
Freq: Once | INTRAVENOUS | Status: AC
  Administered 2018-09-03: 11:00:00 via INTRAVENOUS
  Filled 2018-09-03: qty 250

## 2018-09-03 MED ORDER — SODIUM CHLORIDE 0.9% FLUSH
10.0000 mL | Freq: Once | INTRAVENOUS | Status: AC
  Administered 2018-09-03: 10 mL
  Filled 2018-09-03: qty 10

## 2018-09-03 MED ORDER — HEPARIN SOD (PORK) LOCK FLUSH 100 UNIT/ML IV SOLN
500.0000 [IU] | Freq: Once | INTRAVENOUS | Status: AC
  Administered 2018-09-03: 500 [IU]
  Filled 2018-09-03: qty 5

## 2018-09-03 NOTE — Patient Instructions (Addendum)
Rehydration, Adult Rehydration is the replacement of body fluids and salts and minerals (electrolytes) that are lost during dehydration. Dehydration is when there is not enough fluid or water in the body. This happens when you lose more fluids than you take in. Common causes of dehydration include:  Vomiting.  Diarrhea.  Excessive sweating, such as from heat exposure or exercise.  Taking medicines that cause the body to lose excess fluid (diuretics).  Impaired kidney function.  Not drinking enough fluid.  Certain illnesses or infections.  Certain poorly controlled long-term (chronic) illnesses, such as diabetes, heart disease, and kidney disease.  Symptoms of mild dehydration may include thirst, dry lips and mouth, dry skin, and dizziness. Symptoms of severe dehydration may include increased heart rate, confusion, fainting, and not urinating. You can rehydrate by drinking certain fluids or getting fluids through an IV tube, as told by your health care provider. What are the risks? Generally, rehydration is safe. However, one problem that can happen is taking in too much fluid (overhydration). This is rare. If overhydration happens, it can cause an electrolyte imbalance, kidney failure, or a decrease in salt (sodium) levels in the body. How to rehydrate Follow instructions from your health care provider for rehydration. The kind of fluid you should drink and the amount you should drink depend on your condition.  If directed by your health care provider, drink an oral rehydration solution (ORS). This is a drink designed to treat dehydration that is found in pharmacies and retail stores. ? Make an ORS by following instructions on the package. ? Start by drinking small amounts, about  cup (120 mL) every 5-10 minutes. ? Slowly increase how much you drink until you have taken the amount recommended by your health care provider.  Drink enough clear fluids to keep your urine clear or pale  yellow. If you were instructed to drink an ORS, finish the ORS first, then start slowly drinking other clear fluids. Drink fluids such as: ? Water. Do not drink only water. Doing that can lead to having too little sodium in your body (hyponatremia). ? Ice chips. ? Fruit juice that you have added water to (diluted juice). ? Low-calorie sports drinks.  If you are severely dehydrated, your health care provider may recommend that you receive fluids through an IV tube in the hospital.  Do not take sodium tablets. Doing that can lead to the condition of having too much sodium in your body (hypernatremia). Eating while you rehydrate Follow instructions from your health care provider about what to eat while you rehydrate. Your health care provider may recommend that you slowly begin eating regular foods in small amounts.  Eat foods that contain a healthy balance of electrolytes, such as bananas, oranges, potatoes, tomatoes, and spinach.  Avoid foods that are greasy or contain a lot of fat or sugar.  In some cases, you may get nutrition through a feeding tube that is passed through your nose and into your stomach (nasogastric tube, or NG tube). This may be done if you have uncontrolled vomiting or diarrhea. Beverages to avoid Certain beverages may make dehydration worse. While you rehydrate, avoid:  Alcohol.  Caffeine.  Drinks that contain a lot of sugar. These include: ? High-calorie sports drinks. ? Fruit juice that is not diluted. ? Soda.  Check nutrition labels to see how much sugar or caffeine a beverage contains. Signs of dehydration recovery You may be recovering from dehydration if:  You are urinating more often than before you started   rehydrating.  Your urine is clear or pale yellow.  Your energy level improves.  You vomit less frequently.  You have diarrhea less frequently.  Your appetite improves or returns to normal.  You feel less dizzy or less light-headed.  Your  skin tone and color start to look more normal. Contact a health care provider if:  You continue to have symptoms of mild dehydration, such as: ? Thirst. ? Dry lips. ? Slightly dry mouth. ? Dry, warm skin. ? Dizziness.  You continue to vomit or have diarrhea. Get help right away if:  You have symptoms of dehydration that get worse.  You feel: ? Confused. ? Weak. ? Like you are going to faint.  You have not urinated in 6-8 hours.  You have very dark urine.  You have trouble breathing.  Your heart rate while sitting still is over 100 beats a minute.  You cannot drink fluids without vomiting.  You have vomiting or diarrhea that: ? Gets worse. ? Does not go away.  You have a fever. This information is not intended to replace advice given to you by your health care provider. Make sure you discuss any questions you have with your health care provider. Document Released: 07/09/2011 Document Revised: 11/04/2015 Document Reviewed: 06/10/2015 Elsevier Interactive Patient Education  2019 [employer] Inc.  Coronavirus (COVID-19) Are you at risk?  Are you at risk for the Coronavirus (COVID-19)?  To be considered HIGH RISK for Coronavirus (COVID-19), you have to meet the following criteria:  . Traveled to [place], [place], [place], [place] or [place]; or in the [place] to [place], [place], [place], or [place]; and have fever, cough, and shortness of breath within the last 2 weeks of travel OR . Been in close contact with a person diagnosed with COVID-19 within the last 2 weeks and have fever, cough, and shortness of breath . IF YOU DO NOT MEET THESE CRITERIA, YOU ARE CONSIDERED LOW RISK FOR COVID-19.  What to do if you are HIGH RISK for COVID-19?  . If you are having a medical emergency, call 911. . Seek medical care right away. Before you go to a doctor's office, urgent care or emergency department, call ahead and tell them about your recent travel, contact with  someone diagnosed with COVID-19, and your symptoms. You should receive instructions from your physician's office regarding next steps of care.  . When you arrive at healthcare provider, tell the healthcare staff immediately you have returned from visiting [place], [place], [place], [place] or [place]; or traveled in the [place] to [place], [place], [place], or [place]; in the last two weeks or you have been in close contact with a person diagnosed with COVID-19 in the last 2 weeks.   . Tell the health care staff about your symptoms: fever, cough and shortness of breath. . After you have been seen by a medical provider, you will be either: o Tested for (COVID-19) and discharged home on quarantine except to seek medical care if symptoms worsen, and asked to  - Stay home and avoid contact with others until you get your results (4-5 days)  - Avoid travel on public transportation if possible (such as bus, train, or airplane) or o Sent to the Emergency Department by EMS for evaluation, COVID-19 testing, and possible admission depending on your condition and test results.  What to do if you are LOW RISK for COVID-19?  Reduce your risk of any infection by using the same   precautions used for avoiding the common cold or flu:  . Wash your hands often with soap and warm water for at least 20 seconds.  If soap and water are not readily available, use an alcohol-based hand sanitizer with at least 60% alcohol.  . If coughing or sneezing, cover your mouth and nose by coughing or sneezing into the elbow areas of your shirt or coat, into a tissue or into your sleeve (not your hands). . Avoid shaking hands with others and consider head nods or verbal greetings only. . Avoid touching your eyes, nose, or mouth with unwashed hands.  . Avoid close contact with people who are sick. . Avoid places or events with large numbers of people in one location, like concerts or sporting events. . Carefully consider  travel plans you have or are making. . If you are planning any travel outside or inside the [place], visit the CDC's Travelers' Health webpage for the latest health notices. . If you have some symptoms but not all symptoms, continue to monitor at home and seek medical attention if your symptoms worsen. . If you are having a medical emergency, call 911.   ADDITIONAL HEALTHCARE OPTIONS FOR PATIENTS  [facility] / e-Visit: [email or web address]         [facility]: [phone]  [facility]: [phone]                   [facility]: [phone]   

## 2018-09-03 NOTE — Assessment & Plan Note (Signed)
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 Neoadjuvant chemotherapy with Adriamycin and Cytoxan dose dense 4 followed byTaxolweekly 12and carboplatin every 3 weeks 2. Followed by breast conserving surgery with sentinel lymph node study  3. Followed by adjuvant radiation therapy Genetics consultation is happening this week Echocardiogram 06/10/2018: EF greater than 65% Breast MRI 06/09/2018: 1.1 cm enhancing mass UOQ right breast ---------------------------------------------------------------------------------------------------------------------------------- Current treatment: Completed 4 cycles ofdose dense Adriamycin and Cytoxan, today cycle 4 Taxol Chemo toxicities: 1.Fatigue  2. Alopecia 3.Chemotherapy-induced anemia hemoglobin9.9 monitoring closely 4.Hypokalemia: On oral potassium replacement Labs have been reviewed  Return to clinic weekly for Taxol and every other week for follow-up with me

## 2018-09-05 ENCOUNTER — Encounter: Payer: Self-pay | Admitting: Hematology and Oncology

## 2018-09-09 ENCOUNTER — Inpatient Hospital Stay: Payer: Managed Care, Other (non HMO)

## 2018-09-09 ENCOUNTER — Telehealth: Payer: Self-pay | Admitting: Hematology and Oncology

## 2018-09-09 NOTE — Telephone Encounter (Signed)
Spoke with pt and she agrees to convert 09/15/18 office visit into Doximity appt.

## 2018-09-10 ENCOUNTER — Ambulatory Visit: Payer: Managed Care, Other (non HMO) | Admitting: Hematology and Oncology

## 2018-09-12 ENCOUNTER — Other Ambulatory Visit: Payer: Self-pay

## 2018-09-12 ENCOUNTER — Ambulatory Visit
Admission: RE | Admit: 2018-09-12 | Discharge: 2018-09-12 | Disposition: A | Discharge status: Home / Self Care | Payer: Managed Care, Other (non HMO) | Source: Ambulatory Visit | Attending: Hematology and Oncology | Admitting: Hematology and Oncology

## 2018-09-12 DIAGNOSIS — C50411 Malignant neoplasm of upper-outer quadrant of right female breast: Secondary | ICD-10-CM

## 2018-09-12 DIAGNOSIS — Z171 Estrogen receptor negative status [ER-]: Secondary | ICD-10-CM

## 2018-09-12 MED ORDER — GADOBUTROL 1 MMOL/ML IV SOLN
6.0000 mL | Freq: Once | INTRAVENOUS | Status: AC | PRN
  Administered 2018-09-12: 6 mL via INTRAVENOUS

## 2018-09-13 NOTE — Progress Notes (Signed)
HEMATOLOGY-ONCOLOGY DOXIMITY VISIT PROGRESS NOTE  I connected with Katrina Liu on 09/15/2018 at 11:30 AM EDT by Doximity video conference and verified that I am speaking with the correct person using two identifiers.  I discussed the limitations, risks, security and privacy concerns of performing an evaluation and management service by Doximity and the availability of in person appointments.  I also discussed with the patient that there may be a patient responsible charge related to this service. The patient expressed understanding and agreed to proceed.  Patient's Location: Home Physician Location: Clinic  CHIEF COMPLIANT: Follow-up to review breast MRI  INTERVAL HISTORY: Katrina Liu is a 58 y.o. female with above-mentioned history of right breast cancer who completed 4 cycles of neoadjuvant chemotherapy with dose dense Adriamycin and Cytoxan and 2 cycles of weekly Taxol.Chemotherapy was discontinued last week due to severe mucositis. Breast MRI on 09/12/18 showed decrease in the known right breast mass from 90m previously to 741m She presents over Doximity to review her breast MRI.  Continues to have lips and mouth sores slowly improving. 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 The CancerNext gene panel offered by [employer] sequencing and rearrangement analysis for the following 34 genes:   APC, ATM, BARD1, BMPR1A, BRCA1, BRCA2, BRIP1, CDH1, CDK4, CDKN2A, CHEK2, DICER1, EPCAM, GREM1, HOXB13MLH1, MRE11A, MSH2, MSH6, MUTYH, NBN, NF1, PALB2, PMS2, POLD1, POLE, PTEN, RAD50, RAD51D, RAD51C, SMAD4, SMARCA4, STK11, and TP53. The report date is 07/15/2018.     REVIEW OF SYSTEMS:   Constitutional: Denies fevers, chills or abnormal weight loss Eyes: Denies blurriness of vision Ears, nose, mouth, throat, and face: Denies mucositis or sore throat Respiratory: Denies cough, dyspnea or wheezes Cardiovascular: Denies palpitation, chest discomfort Gastrointestinal:  Denies nausea, heartburn or change in bowel habits Skin: Denies abnormal skin rashes Lymphatics: Denies new lymphadenopathy or easy bruising Neurological:Denies numbness, tingling or new weaknesses Behavioral/Psych: Mood is stable, no new changes  Extremities: No lower extremity edema Breast: denies any pain or lumps or nodules in either breasts All other systems were reviewed with the patient and are negative.  Observations/Objective:  There were no vitals filed for this visit. There is no height or weight on file to calculate BMI.  I have reviewed the data as listed CMP Latest Ref Rng & Units 09/03/2018 08/25/2018 08/19/2018  Glucose 70 - 99 mg/dL 110(H) 96 82  BUN 6 - 20 mg/dL '9 10 12  ' Creatinine 0.44 - 1.00 mg/dL 0.82 0.85 0.75  Sodium 135 - 145 mmol/L 138 139 142  Potassium 3.5 - 5.1 mmol/L 4.0 3.9 4.5  Chloride 98 - 111 mmol/L 105 106 109  CO2 22 - 32 mmol/L '25 23 25  ' Calcium 8.9 - 10.3 mg/dL 9.1 9.2 8.9  Total Protein 6.5 - 8.1 g/dL 6.4(L) 6.7 6.3(L)  Total Bilirubin 0.3 - 1.2 mg/dL 0.3 0.4 0.4  Alkaline Phos 38 - 126 U/L 68 80 85  AST 15 - 41 U/L 13(L) 46(H) 15  ALT 0  - 44 U/L 14 57(H) 12    Lab Results  Component Value Date   WBC 3.9 (L) 09/03/2018   HGB 8.5 (L) 09/03/2018   HCT 26.1 (L) 09/03/2018   MCV 92.2 09/03/2018   PLT 315 09/03/2018   NEUTROABS 2.3 09/03/2018      Assessment Plan:  Malignant neoplasm of upper-outer quadrant of right breast in female, estrogen receptor negative (HCC) 05/29/2018:Right axillary pain which led to a mammogram and ultrasound that revealed a new mass in the superior UOQ tail of the breast 10 o'clock position by ultrasound measured 8 mm, axilla negative, 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 Neoadjuvant chemotherapy with Adriamycin and Cytoxan dose dense 4 followed byTaxolweekly 2and carboplatin every 3 weeks ( Discontinued due to toxicities) 2. Followed by breast conserving surgery with sentinel lymph node study  3. Followed by adjuvant radiation therapy Genetics consultation is happening this week Echocardiogram 06/10/2018: EF greater than 65% Breast MRI 06/09/2018: 1.1 cm enhancing mass UOQ right breast ---------------------------------------------------------------------------------------------------------------------------------- Breast MRi: Decreased size from 1.1 cm to 0.7 cm Lips and Mouth sores: Conservative management Fatigue: improved  Plan: BCS with SLN Follow up 1 week after surgery (Doximity Visit)   I discussed the assessment and treatment plan with the patient. The patient was provided an opportunity to ask questions and all were answered. The patient agreed with the plan and demonstrated an understanding of the instructions. The patient was advised to call back or seek an in-person evaluation if the symptoms worsen or if the condition fails to improve as anticipated.   I provided 15 minutes of face-to-face Doximity time during this encounter.    Rulon Eisenmenger, MD 09/15/2018   I, Molly Dorshimer, am acting as  scribe for Nicholas Lose, MD.  I have reviewed the above documentation for accuracy and completeness, and I agree with the above.

## 2018-09-15 ENCOUNTER — Inpatient Hospital Stay (HOSPITAL_BASED_OUTPATIENT_CLINIC_OR_DEPARTMENT_OTHER): Payer: Managed Care, Other (non HMO) | Admitting: Hematology and Oncology

## 2018-09-15 ENCOUNTER — Ambulatory Visit: Payer: Self-pay | Admitting: Surgery

## 2018-09-15 DIAGNOSIS — C50411 Malignant neoplasm of upper-outer quadrant of right female breast: Secondary | ICD-10-CM | POA: Diagnosis not present

## 2018-09-15 DIAGNOSIS — Z9221 Personal history of antineoplastic chemotherapy: Secondary | ICD-10-CM

## 2018-09-15 DIAGNOSIS — Z171 Estrogen receptor negative status [ER-]: Secondary | ICD-10-CM

## 2018-09-15 DIAGNOSIS — C50911 Malignant neoplasm of unspecified site of right female breast: Secondary | ICD-10-CM

## 2018-09-15 NOTE — H&P (Signed)
Katrina Liu Score Documented: 09/15/2018 3:10 PM Location: [facility] Patient #: [id number] DOB: [birth year] Single / Language: Katrina Liu / Race: Black or African American Female  History of Present Illness Katrina Moores A. Deagen Krass MD; 09/15/2018 3:40 PM) Patient words: Pt returns sfter neoadjuvant chemotherapy and had a good response on MRI. She could not complete therapy due to mucositis. MRI reviewed and she is feeling well at this point. Genetics negative and she desires breast conservation.                               Patient with history of right breast cancer status post neoadjuvant chemotherapy.  EXAM: BILATERAL BREAST MRI WITH AND WITHOUT CONTRAST  TECHNIQUE: Multiplanar, multisequence MR images of both breasts were obtained prior to and following the intravenous administration of 6 ml of Gadavist  Three-dimensional MR images were rendered by post-processing of the original MR data on an independent workstation. The three-dimensional MR images were interpreted, and findings are reported in the following complete MRI report for this study. Three dimensional images were evaluated at the independent DynaCad workstation  COMPARISON: Previous exam(s).  FINDINGS: Breast composition: c. Heterogeneous fibroglandular tissue.  Background parenchymal enhancement: Mild  Right breast: Susceptibility artifact is demonstrated within the far upper-outer right breast compatible with biopsy marking clip. Interval decrease in size surrounding enhancing mass measuring 7 x 7 x 7 mm, previously 11 x 10 x 11 mm. No new masses identified within the right breast.  Left breast: No mass or abnormal enhancement.  Lymph nodes: No abnormal appearing lymph nodes.  Ancillary findings: None.  IMPRESSION: Interval decrease in size of enhancing mass within the upper-outer right breast when compared to prior exam.  RECOMMENDATION: Treatment plan for known  right breast malignancy.  BI-RADS CATEGORY 6: Known biopsy-proven malignancy.   Electronically Signed By: Katrina Liu M.D. On: 09/12/2018 16:40.  The patient is a 58 year old female.   Allergies (Katrina Liu, RMA; 09/15/2018 3:10 PM) Lisinopril *ANTIHYPERTENSIVES* Swelling. Allergies Reconciled  Medication History (Katrina Liu, RMA; 09/15/2018 3:11 PM) Ascorbic Acid (500MG  Capsule, Oral) Active. Aspirin (81MG  Tablet DR, Oral) Active. Biotin (1000MCG Tablet, Oral) Active. Hygroton (25MG  Tablet, Oral) Active. Flax Seed Oil (1000MG  Capsule, Oral) Active. Lortab (10-300MG /15ML Elixir, Oral) Active. Labetalol HCl (100MG  Tablet, Oral) Active. Turmeric (500MG  Capsule, Oral) Active. Zinc Gluconate (50MG  Tablet, Oral) Active. Medications Reconciled    Vitals (Katrina Liu RMA; 09/15/2018 3:10 PM) 09/15/2018 3:10 PM Weight: 139.8 lb Height: 65in Body Surface Area: 1.7 m Body Mass Index: 23.26 kg/m  Temp.: 98.57F  Pulse: 63 (Regular)  BP: 124/76 (Sitting, Left Arm, Standard)      Physical Exam (Katrina Liu A. Alondra Sahni MD; 09/15/2018 3:40 PM)  General Mental Status-Alert. General Appearance-Consistent with stated age. Hydration-Well hydrated. Voice-Normal.  Head and Neck Head-normocephalic, atraumatic with no lesions or palpable masses. Trachea-midline. Thyroid Gland Characteristics - normal size and consistency.  Chest and Lung Exam Chest and lung exam reveals -quiet, even and easy respiratory effort with no use of accessory muscles and on auscultation, normal breath sounds, no adventitious sounds and normal vocal resonance. Inspection Chest Wall - Normal. Back - normal.  Breast Breast - Left-Symmetric, Non Tender, No Biopsy scars, no Dimpling, No Inflammation, No Lumpectomy scars, No Mastectomy scars, No Peau d' Orange. Breast - Right-Symmetric, Non Tender, No Biopsy scars, no Dimpling, No Inflammation, No  Lumpectomy scars, No Mastectomy scars, No Peau d' Orange. Breast Lump-No Palpable Breast Mass.  Neurologic Neurologic evaluation reveals -alert and oriented x 3 with no impairment of recent or remote memory. Mental Status-Normal.  Lymphatic Head & Neck  General Head & Neck Lymphatics: Bilateral - Description - Normal. Axillary  General Axillary Region: Bilateral - Description - Normal. Tenderness - Non Tender.    Assessment & Plan (Katrina Liu A. Lamorris Knoblock MD; 09/15/2018 3:40 PM)  MALIGNANT NEOPLASM OF RIGHT BREAST, STAGE 1, ESTROGEN RECEPTOR NEGATIVE (C50.911) Impression: 58 year old female with a new diagnosis of cancer of the right breast, upper outer quadrant tail of Spence. Clinical stage 1 a, triple negative We discussed options of breast conservation with lumpectomy or total mastectomy and sentinal lymph node biopsy/dissection. she would be a good candidate for breast conservation with lumpectomy and axillary sentinel lymph node biopsy and we discussed this in detail. She does have a triple negative cancer.  Risk of lumpectomy include bleeding, infection, seroma, more surgery, use of seed/wire, wound care, cosmetic deformity and the need for other treatments, death , blood clots, death. Pt agrees to proceed. Risk of sentinel lymph node mapping include bleeding, infection, lymphedema, shoulder pain. stiffness, dye allergy. cosmetic deformity , blood clots, death, need for more surgery. Pt agres to proceed.  Current Plans You are being scheduled for surgery- Our schedulers will call you.  You should hear from our office's scheduling department within 5 working days about the location, date, and time of surgery. We try to make accommodations for patient's preferences in scheduling surgery, but sometimes the OR schedule or the surgeon's schedule prevents [place] from making those accommodations.  If you have not heard from our office [phone] in 5 working days, call the office and  ask for your surgeon's nurse.  If you have other questions about your diagnosis, plan, or surgery, call the office and ask for your surgeon's nurse.  We discussed the staging and pathophysiology of breast cancer. We discussed all of the different options for treatment for breast cancer including surgery, chemotherapy, radiation therapy, Herceptin, and antiestrogen therapy. We discussed a sentinel lymph node biopsy as she does not appear to having lymph node involvement right now. We discussed the performance of that with injection of radioactive tracer and blue dye. We discussed that she would have an incision underneath her axillary hairline. We discussed that there is a bout a 10-20% chance of having a positive node with a sentinel lymph node biopsy and we will await the permanent pathology to make any other first further decisions in terms of her treatment. One of these options might be to return to the operating room to perform an axillary lymph node dissection. We discussed about a 1-2% risk lifetime of chronic shoulder pain as well as lymphedema associated with a sentinel lymph node biopsy. We discussed the options for treatment of the breast cancer which included lumpectomy versus a mastectomy. We discussed the performance of the lumpectomy with a wire placement. We discussed a 10-20% chance of a positive margin requiring reexcision in the operating room. We also discussed that she may need radiation therapy or antiestrogen therapy or both if she undergoes lumpectomy. We discussed the mastectomy and the postoperative care for that as well. We discussed that there is no difference in her survival whether she undergoes lumpectomy with radiation therapy or antiestrogen therapy versus a mastectomy. There is a slight difference in the local recurrence rate being 3-5% with lumpectomy and about 1% with a mastectomy. We discussed the risks of operation including bleeding, infection, possible reoperation. She  understands  her further therapy will be based on what her stages at the time of her operation.  Pt Education - [facility] breast cancer surgery: discussed with patient and provided information. Pt Education - [facility] Breast Biopsy HCI: discussed with patient and provided information. Pt Education - [facility] Breast Pains Education

## 2018-09-15 NOTE — H&P (View-Only) (Signed)
Erasmo Score Documented: 09/15/2018 3:10 PM Location: [facility] Patient #: [id number] DOB: Jan 23, 1961 Single / Language: [name] / Race: Black or African American Female  History of Present Illness Marcello Moores A. Vernelle Wisner MD; 09/15/2018 3:40 PM) Patient words: Pt returns sfter neoadjuvant chemotherapy and had a good response on MRI. She could not complete therapy due to mucositis. MRI reviewed and she is feeling well at this point. Genetics negative and she desires breast conservation.                               Patient with history of right breast cancer status post neoadjuvant chemotherapy.  EXAM: BILATERAL BREAST MRI WITH AND WITHOUT CONTRAST  TECHNIQUE: Multiplanar, multisequence MR images of both breasts were obtained prior to and following the intravenous administration of 6 ml of Gadavist  Three-dimensional MR images were rendered by post-processing of the original MR data on an independent workstation. The three-dimensional MR images were interpreted, and findings are reported in the following complete MRI report for this study. Three dimensional images were evaluated at the independent DynaCad workstation  COMPARISON: Previous exam(s).  FINDINGS: Breast composition: c. Heterogeneous fibroglandular tissue.  Background parenchymal enhancement: Mild  Right breast: Susceptibility artifact is demonstrated within the far upper-outer right breast compatible with biopsy marking clip. Interval decrease in size surrounding enhancing mass measuring 7 x 7 x 7 mm, previously 11 x 10 x 11 mm. No new masses identified within the right breast.  Left breast: No mass or abnormal enhancement.  Lymph nodes: No abnormal appearing lymph nodes.  Ancillary findings: None.  IMPRESSION: Interval decrease in size of enhancing mass within the upper-outer right breast when compared to prior exam.  RECOMMENDATION: Treatment plan for known  right breast malignancy.  BI-RADS CATEGORY 6: Known biopsy-proven malignancy.   Electronically Signed By: Lovey Newcomer M.D. On: 09/12/2018 16:40.  The patient is a 58 year old female.   Allergies (Tanisha A. Owens Shark, RMA; 09/15/2018 3:10 PM) Lisinopril *ANTIHYPERTENSIVES* Swelling. Allergies Reconciled  Medication History (Tanisha A. Owens Shark, RMA; 09/15/2018 3:11 PM) Ascorbic Acid (500MG  Capsule, Oral) Active. Aspirin (81MG  Tablet DR, Oral) Active. Biotin (1000MCG Tablet, Oral) Active. Hygroton (25MG  Tablet, Oral) Active. Flax Seed Oil (1000MG  Capsule, Oral) Active. Lortab (10-300MG /15ML Elixir, Oral) Active. Labetalol HCl (100MG  Tablet, Oral) Active. Turmeric (500MG  Capsule, Oral) Active. Zinc Gluconate (50MG  Tablet, Oral) Active. Medications Reconciled    Vitals (Tanisha A. Brown RMA; 09/15/2018 3:10 PM) 09/15/2018 3:10 PM Weight: 139.8 lb Height: 65in Body Surface Area: 1.7 m Body Mass Index: 23.26 kg/m  Temp.: 98.63F  Pulse: 63 (Regular)  BP: 124/76 (Sitting, Left Arm, Standard)      Physical Exam (Margaux Engen A. Saim Almanza MD; 09/15/2018 3:40 PM)  General Mental Status-Alert. General Appearance-Consistent with stated age. Hydration-Well hydrated. Voice-Normal.  Head and Neck Head-normocephalic, atraumatic with no lesions or palpable masses. Trachea-midline. Thyroid Gland Characteristics - normal size and consistency.  Chest and Lung Exam Chest and lung exam reveals -quiet, even and easy respiratory effort with no use of accessory muscles and on auscultation, normal breath sounds, no adventitious sounds and normal vocal resonance. Inspection Chest Wall - Normal. Back - normal.  Breast Breast - Left-Symmetric, Non Tender, No Biopsy scars, no Dimpling, No Inflammation, No Lumpectomy scars, No Mastectomy scars, No Peau d' Orange. Breast - Right-Symmetric, Non Tender, No Biopsy scars, no Dimpling, No Inflammation, No  Lumpectomy scars, No Mastectomy scars, No Peau d' Orange. Breast Lump-No Palpable Breast Mass.  Neurologic Neurologic evaluation reveals -alert and oriented x 3 with no impairment of recent or remote memory. Mental Status-Normal.  Lymphatic Head & Neck  General Head & Neck Lymphatics: Bilateral - Description - Normal. Axillary  General Axillary Region: Bilateral - Description - Normal. Tenderness - Non Tender.    Assessment & Plan (Carney Saxton A. Maleiyah Releford MD; 09/15/2018 3:40 PM)  MALIGNANT NEOPLASM OF RIGHT BREAST, STAGE 1, ESTROGEN RECEPTOR NEGATIVE (C50.911) Impression: 58 year old female with a new diagnosis of cancer of the right breast, upper outer quadrant tail of Spence. Clinical stage 1 a, triple negative We discussed options of breast conservation with lumpectomy or total mastectomy and sentinal lymph node biopsy/dissection. she would be a good candidate for breast conservation with lumpectomy and axillary sentinel lymph node biopsy and we discussed this in detail. She does have a triple negative cancer.  Risk of lumpectomy include bleeding, infection, seroma, more surgery, use of seed/wire, wound care, cosmetic deformity and the need for other treatments, death , blood clots, death. Pt agrees to proceed. Risk of sentinel lymph node mapping include bleeding, infection, lymphedema, shoulder pain. stiffness, dye allergy. cosmetic deformity , blood clots, death, need for more surgery. Pt agres to proceed.  Current Plans You are being scheduled for surgery- Our schedulers will call you.  You should hear from our office's scheduling department within 5 working days about the location, date, and time of surgery. We try to make accommodations for patient's preferences in scheduling surgery, but sometimes the OR schedule or the surgeon's schedule prevents [place] from making those accommodations.  If you have not heard from our office [phone] in 5 working days, call the office and  ask for your surgeon's nurse.  If you have other questions about your diagnosis, plan, or surgery, call the office and ask for your surgeon's nurse.  We discussed the staging and pathophysiology of breast cancer. We discussed all of the different options for treatment for breast cancer including surgery, chemotherapy, radiation therapy, Herceptin, and antiestrogen therapy. We discussed a sentinel lymph node biopsy as she does not appear to having lymph node involvement right now. We discussed the performance of that with injection of radioactive tracer and blue dye. We discussed that she would have an incision underneath her axillary hairline. We discussed that there is a bout a 10-20% chance of having a positive node with a sentinel lymph node biopsy and we will await the permanent pathology to make any other first further decisions in terms of her treatment. One of these options might be to return to the operating room to perform an axillary lymph node dissection. We discussed about a 1-2% risk lifetime of chronic shoulder pain as well as lymphedema associated with a sentinel lymph node biopsy. We discussed the options for treatment of the breast cancer which included lumpectomy versus a mastectomy. We discussed the performance of the lumpectomy with a wire placement. We discussed a 10-20% chance of a positive margin requiring reexcision in the operating room. We also discussed that she may need radiation therapy or antiestrogen therapy or both if she undergoes lumpectomy. We discussed the mastectomy and the postoperative care for that as well. We discussed that there is no difference in her survival whether she undergoes lumpectomy with radiation therapy or antiestrogen therapy versus a mastectomy. There is a slight difference in the local recurrence rate being 3-5% with lumpectomy and about 1% with a mastectomy. We discussed the risks of operation including bleeding, infection, possible reoperation. She  understands  her further therapy will be based on what her stages at the time of her operation.  Pt Education - [facility] breast cancer surgery: discussed with patient and provided information. Pt Education - [facility] Breast Biopsy HCI: discussed with patient and provided information. Pt Education - [facility] Breast Pains Education

## 2018-09-15 NOTE — Assessment & Plan Note (Signed)
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 Neoadjuvant chemotherapy with Adriamycin and Cytoxan dose dense 4 followed byTaxolweekly 2and carboplatin every 3 weeks ( Discontinued due to toxicities) 2. Followed by breast conserving surgery with sentinel lymph node study  3. Followed by adjuvant radiation therapy Genetics consultation is happening this week Echocardiogram 06/10/2018: EF greater than 65% Breast MRI 06/09/2018: 1.1 cm enhancing mass UOQ right breast ---------------------------------------------------------------------------------------------------------------------------------- Breast MRi: Decreased size from 1.1 cm to 0.7 cm Lips and Mouth sores: Conservative management  Plan: [facility] with [facility] Follow up 1 week after surgery (Doximity Visit)

## 2018-09-16 ENCOUNTER — Ambulatory Visit: Payer: Managed Care, Other (non HMO)

## 2018-09-16 ENCOUNTER — Other Ambulatory Visit: Payer: Self-pay | Admitting: Surgery

## 2018-09-16 ENCOUNTER — Other Ambulatory Visit: Payer: Managed Care, Other (non HMO)

## 2018-09-16 ENCOUNTER — Other Ambulatory Visit: Payer: Self-pay | Admitting: *Deleted

## 2018-09-16 ENCOUNTER — Ambulatory Visit: Payer: Managed Care, Other (non HMO) | Admitting: Hematology and Oncology

## 2018-09-16 DIAGNOSIS — C50411 Malignant neoplasm of upper-outer quadrant of right female breast: Secondary | ICD-10-CM

## 2018-09-16 DIAGNOSIS — Z171 Estrogen receptor negative status [ER-]: Secondary | ICD-10-CM

## 2018-09-16 DIAGNOSIS — C50911 Malignant neoplasm of unspecified site of right female breast: Secondary | ICD-10-CM

## 2018-09-17 ENCOUNTER — Telehealth: Payer: Self-pay | Admitting: Hematology and Oncology

## 2018-09-17 NOTE — Telephone Encounter (Signed)
Called regarding schedule °

## 2018-09-23 ENCOUNTER — Other Ambulatory Visit: Payer: Managed Care, Other (non HMO)

## 2018-09-23 ENCOUNTER — Ambulatory Visit: Payer: Managed Care, Other (non HMO)

## 2018-09-25 ENCOUNTER — Encounter (HOSPITAL_BASED_OUTPATIENT_CLINIC_OR_DEPARTMENT_OTHER): Payer: Self-pay | Admitting: *Deleted

## 2018-09-25 ENCOUNTER — Other Ambulatory Visit: Payer: Self-pay

## 2018-09-25 ENCOUNTER — Encounter: Payer: Self-pay | Admitting: Hematology and Oncology

## 2018-09-26 ENCOUNTER — Telehealth: Payer: Self-pay | Admitting: Hematology and Oncology

## 2018-09-26 NOTE — Telephone Encounter (Signed)
Called regarding schedule °

## 2018-09-29 ENCOUNTER — Encounter (HOSPITAL_BASED_OUTPATIENT_CLINIC_OR_DEPARTMENT_OTHER)
Admission: RE | Admit: 2018-09-29 | Discharge: 2018-09-29 | Disposition: A | Discharge status: Home / Self Care | Payer: Managed Care, Other (non HMO) | Source: Ambulatory Visit | Attending: Surgery | Admitting: Surgery

## 2018-09-29 ENCOUNTER — Other Ambulatory Visit (HOSPITAL_COMMUNITY)
Admission: RE | Admit: 2018-09-29 | Discharge: 2018-09-29 | Disposition: A | Discharge status: Home / Self Care | Payer: Managed Care, Other (non HMO) | Source: Ambulatory Visit | Attending: Surgery | Admitting: Surgery

## 2018-09-29 ENCOUNTER — Other Ambulatory Visit: Payer: Self-pay

## 2018-09-29 DIAGNOSIS — Z1159 Encounter for screening for other viral diseases: Secondary | ICD-10-CM | POA: Insufficient documentation

## 2018-09-29 DIAGNOSIS — Z01818 Encounter for other preprocedural examination: Secondary | ICD-10-CM | POA: Insufficient documentation

## 2018-09-29 LAB — COMPREHENSIVE METABOLIC PANEL
ALT: 18 U/L (ref 0–44)
AST: 22 U/L (ref 15–41)
Albumin: 4.1 g/dL (ref 3.5–5.0)
Alkaline Phosphatase: 77 U/L (ref 38–126)
Anion gap: 11 (ref 5–15)
BUN: 12 mg/dL (ref 6–20)
CO2: 26 mmol/L (ref 22–32)
Calcium: 9.9 mg/dL (ref 8.9–10.3)
Chloride: 104 mmol/L (ref 98–111)
Creatinine, Ser: 0.93 mg/dL (ref 0.44–1.00)
GFR calc Af Amer: >60 mL/min (ref >60)
GFR calc non Af Amer: >60 mL/min (ref >60)
Glucose, Bld: 91 mg/dL (ref 70–99)
Potassium: 3.8 mmol/L (ref 3.5–5.1)
Sodium: 141 mmol/L (ref 135–145)
Total Bilirubin: 0.8 mg/dL (ref 0.3–1.2)
Total Protein: 6.8 g/dL (ref 6.5–8.1)

## 2018-09-29 LAB — CBC WITH DIFFERENTIAL/PLATELET
Abs Immature Granulocytes: 0.01 10*3/uL (ref 0.00–0.07)
Basophils Absolute: 0 10*3/uL (ref 0.0–0.1)
Basophils Relative: 1 %
Eosinophils Absolute: 0.1 10*3/uL (ref 0.0–0.5)
Eosinophils Relative: 4 %
HCT: 36.7 % (ref 36.0–46.0)
Hemoglobin: 11.9 g/dL — ABNORMAL LOW (ref 12.0–15.0)
Immature Granulocytes: 0 %
Lymphocytes Relative: 33 %
Lymphs Abs: 1 10*3/uL (ref 0.7–4.0)
MCH: 30.7 pg (ref 26.0–34.0)
MCHC: 32.4 g/dL (ref 30.0–36.0)
MCV: 94.8 fL (ref 80.0–100.0)
Monocytes Absolute: 0.4 10*3/uL (ref 0.1–1.0)
Monocytes Relative: 13 %
Neutro Abs: 1.4 10*3/uL — ABNORMAL LOW (ref 1.7–7.7)
Neutrophils Relative %: 49 %
Platelets: 261 10*3/uL (ref 150–400)
RBC: 3.87 MIL/uL (ref 3.87–5.11)
RDW: 15.1 % (ref 11.5–15.5)
WBC: 2.9 10*3/uL — ABNORMAL LOW (ref 4.0–10.5)
nRBC: 0 % (ref 0.0–0.2)

## 2018-09-29 LAB — SARS CORONAVIRUS 2 BY RT PCR (HOSPITAL ORDER, PERFORMED IN ~~LOC~~ HOSPITAL LAB): SARS Coronavirus 2: NEGATIVE

## 2018-09-30 NOTE — Progress Notes (Signed)
Reviewed results from CBC / DIFF and latest EKG with Dr. Marcell Barlow.  Okay to proceed with surgery as scheduled.

## 2018-10-01 ENCOUNTER — Ambulatory Visit
Admission: RE | Admit: 2018-10-01 | Discharge: 2018-10-01 | Disposition: A | Discharge status: Home / Self Care | Payer: Managed Care, Other (non HMO) | Source: Ambulatory Visit | Attending: Surgery | Admitting: Surgery

## 2018-10-01 ENCOUNTER — Other Ambulatory Visit: Payer: Self-pay

## 2018-10-01 DIAGNOSIS — C50911 Malignant neoplasm of unspecified site of right female breast: Secondary | ICD-10-CM

## 2018-10-02 ENCOUNTER — Ambulatory Visit
Admission: RE | Admit: 2018-10-02 | Discharge: 2018-10-02 | Disposition: A | Discharge status: Home / Self Care | Payer: Managed Care, Other (non HMO) | Source: Ambulatory Visit | Attending: Surgery | Admitting: Surgery

## 2018-10-02 ENCOUNTER — Encounter (HOSPITAL_BASED_OUTPATIENT_CLINIC_OR_DEPARTMENT_OTHER): Payer: Self-pay | Admitting: *Deleted

## 2018-10-02 ENCOUNTER — Ambulatory Visit (HOSPITAL_BASED_OUTPATIENT_CLINIC_OR_DEPARTMENT_OTHER): Payer: Managed Care, Other (non HMO) | Admitting: Anesthesiology

## 2018-10-02 ENCOUNTER — Encounter (HOSPITAL_BASED_OUTPATIENT_CLINIC_OR_DEPARTMENT_OTHER)
Admission: RE | Disposition: A | Discharge status: Home / Self Care | Payer: Self-pay | Source: Home / Self Care | Attending: Surgery

## 2018-10-02 ENCOUNTER — Ambulatory Visit (HOSPITAL_COMMUNITY)
Admission: RE | Admit: 2018-10-02 | Discharge: 2018-10-02 | Disposition: A | Discharge status: Home / Self Care | Payer: Managed Care, Other (non HMO) | Source: Ambulatory Visit | Attending: Surgery | Admitting: Surgery

## 2018-10-02 ENCOUNTER — Other Ambulatory Visit: Payer: Self-pay

## 2018-10-02 ENCOUNTER — Ambulatory Visit (HOSPITAL_BASED_OUTPATIENT_CLINIC_OR_DEPARTMENT_OTHER)
Admission: RE | Admit: 2018-10-02 | Discharge: 2018-10-02 | Disposition: A | Discharge status: Home / Self Care | Payer: Managed Care, Other (non HMO) | Attending: Surgery | Admitting: Surgery

## 2018-10-02 DIAGNOSIS — I10 Essential (primary) hypertension: Secondary | ICD-10-CM | POA: Diagnosis not present

## 2018-10-02 DIAGNOSIS — Z171 Estrogen receptor negative status [ER-]: Secondary | ICD-10-CM | POA: Diagnosis not present

## 2018-10-02 DIAGNOSIS — Z9221 Personal history of antineoplastic chemotherapy: Secondary | ICD-10-CM | POA: Insufficient documentation

## 2018-10-02 DIAGNOSIS — C50911 Malignant neoplasm of unspecified site of right female breast: Secondary | ICD-10-CM

## 2018-10-02 DIAGNOSIS — C50919 Malignant neoplasm of unspecified site of unspecified female breast: Secondary | ICD-10-CM

## 2018-10-02 DIAGNOSIS — K1231 Oral mucositis (ulcerative) due to antineoplastic therapy: Secondary | ICD-10-CM

## 2018-10-02 DIAGNOSIS — Z7982 Long term (current) use of aspirin: Secondary | ICD-10-CM | POA: Diagnosis not present

## 2018-10-02 DIAGNOSIS — K219 Gastro-esophageal reflux disease without esophagitis: Secondary | ICD-10-CM | POA: Diagnosis not present

## 2018-10-02 DIAGNOSIS — C50411 Malignant neoplasm of upper-outer quadrant of right female breast: Secondary | ICD-10-CM | POA: Diagnosis present

## 2018-10-02 DIAGNOSIS — Z79899 Other long term (current) drug therapy: Secondary | ICD-10-CM | POA: Diagnosis not present

## 2018-10-02 HISTORY — PX: BREAST LUMPECTOMY: SHX2

## 2018-10-02 HISTORY — DX: Malignant neoplasm of unspecified site of unspecified female breast: C50.919

## 2018-10-02 HISTORY — PX: BREAST LUMPECTOMY WITH RADIOACTIVE SEED AND SENTINEL LYMPH NODE BIOPSY: SHX6550

## 2018-10-02 SURGERY — BREAST LUMPECTOMY WITH RADIOACTIVE SEED AND SENTINEL LYMPH NODE BIOPSY
Anesthesia: General | Site: Breast | Laterality: Right

## 2018-10-02 MED ORDER — METHYLENE BLUE 0.5 % INJ SOLN
INTRAVENOUS | Status: AC
  Filled 2018-10-02: qty 10

## 2018-10-02 MED ORDER — LIDOCAINE 2% (20 MG/ML) 5 ML SYRINGE
INTRAMUSCULAR | Status: DC | PRN
  Administered 2018-10-02: 40 mg via INTRAVENOUS

## 2018-10-02 MED ORDER — CEFAZOLIN SODIUM-DEXTROSE 2-4 GM/100ML-% IV SOLN
INTRAVENOUS | Status: AC
  Filled 2018-10-02: qty 100

## 2018-10-02 MED ORDER — SCOPOLAMINE 1 MG/3DAYS TD PT72: 1.0000 | MEDICATED_PATCH | Freq: Once | TRANSDERMAL | Status: DC | PRN

## 2018-10-02 MED ORDER — CEFAZOLIN SODIUM-DEXTROSE 2-4 GM/100ML-% IV SOLN
2.0000 g | INTRAVENOUS | Status: AC
  Administered 2018-10-02: 2 g via INTRAVENOUS

## 2018-10-02 MED ORDER — SODIUM CHLORIDE (PF) 0.9 % IJ SOLN
INTRAMUSCULAR | Status: AC
  Filled 2018-10-02: qty 10

## 2018-10-02 MED ORDER — 0.9 % SODIUM CHLORIDE (POUR BTL) OPTIME
TOPICAL | Status: DC | PRN
  Administered 2018-10-02: 300 mL

## 2018-10-02 MED ORDER — ONDANSETRON HCL 4 MG/2ML IJ SOLN
INTRAMUSCULAR | Status: AC
  Filled 2018-10-02: qty 2

## 2018-10-02 MED ORDER — FENTANYL CITRATE (PF) 100 MCG/2ML IJ SOLN
50.0000 ug | INTRAMUSCULAR | Status: DC | PRN
  Administered 2018-10-02: 100 ug via INTRAVENOUS

## 2018-10-02 MED ORDER — CHLORHEXIDINE GLUCONATE CLOTH 2 % EX PADS: 6.0000 | MEDICATED_PAD | Freq: Once | CUTANEOUS | Status: DC

## 2018-10-02 MED ORDER — LACTATED RINGERS IV SOLN
INTRAVENOUS | Status: DC
  Administered 2018-10-02: 07:00:00 via INTRAVENOUS

## 2018-10-02 MED ORDER — TECHNETIUM TC 99M SULFUR COLLOID FILTERED
1.0000 | Freq: Once | INTRAVENOUS | Status: AC | PRN
  Administered 2018-10-02: 1 via INTRADERMAL

## 2018-10-02 MED ORDER — SODIUM CHLORIDE (PF) 0.9 % IJ SOLN
INTRAVENOUS | Status: DC | PRN
  Administered 2018-10-02: 08:00:00 via INTRADERMAL

## 2018-10-02 MED ORDER — MEPERIDINE HCL 25 MG/ML IJ SOLN: 6.2500 mg | INTRAMUSCULAR | Status: DC | PRN

## 2018-10-02 MED ORDER — OXYCODONE HCL 5 MG PO TABS: 5.0000 mg | ORAL_TABLET | Freq: Once | ORAL | Status: DC | PRN

## 2018-10-02 MED ORDER — EPHEDRINE SULFATE-NACL 50-0.9 MG/10ML-% IV SOSY
PREFILLED_SYRINGE | INTRAVENOUS | Status: DC | PRN
  Administered 2018-10-02 (×2): 10 mg via INTRAVENOUS
  Administered 2018-10-02: 5 mg via INTRAVENOUS

## 2018-10-02 MED ORDER — CELECOXIB 200 MG PO CAPS
200.0000 mg | ORAL_CAPSULE | ORAL | Status: AC
  Administered 2018-10-02: 200 mg via ORAL

## 2018-10-02 MED ORDER — BUPIVACAINE-EPINEPHRINE (PF) 0.25% -1:200000 IJ SOLN
INTRAMUSCULAR | Status: AC
  Filled 2018-10-02: qty 120

## 2018-10-02 MED ORDER — OXYCODONE HCL 5 MG/5ML PO SOLN: 5.0000 mg | Freq: Once | ORAL | Status: DC | PRN

## 2018-10-02 MED ORDER — HYDROMORPHONE HCL 1 MG/ML IJ SOLN
0.2500 mg | INTRAMUSCULAR | Status: DC | PRN
  Administered 2018-10-02 (×2): 0.5 mg via INTRAVENOUS

## 2018-10-02 MED ORDER — BUPIVACAINE-EPINEPHRINE (PF) 0.25% -1:200000 IJ SOLN
INTRAMUSCULAR | Status: DC | PRN
  Administered 2018-10-02: 10 mL

## 2018-10-02 MED ORDER — MIDAZOLAM HCL 2 MG/2ML IJ SOLN
1.0000 mg | INTRAMUSCULAR | Status: DC | PRN
  Administered 2018-10-02: 2 mg via INTRAVENOUS

## 2018-10-02 MED ORDER — GABAPENTIN 300 MG PO CAPS
ORAL_CAPSULE | ORAL | Status: AC
  Filled 2018-10-02: qty 1

## 2018-10-02 MED ORDER — ACETAMINOPHEN 500 MG PO TABS
ORAL_TABLET | ORAL | Status: AC
  Filled 2018-10-02: qty 2

## 2018-10-02 MED ORDER — BUPIVACAINE HCL (PF) 0.25 % IJ SOLN
INTRAMUSCULAR | Status: AC
  Filled 2018-10-02: qty 30

## 2018-10-02 MED ORDER — ACETAMINOPHEN 500 MG PO TABS
1000.0000 mg | ORAL_TABLET | ORAL | Status: AC
  Administered 2018-10-02: 1000 mg via ORAL

## 2018-10-02 MED ORDER — GABAPENTIN 300 MG PO CAPS
300.0000 mg | ORAL_CAPSULE | ORAL | Status: AC
  Administered 2018-10-02: 300 mg via ORAL

## 2018-10-02 MED ORDER — DEXAMETHASONE SODIUM PHOSPHATE 10 MG/ML IJ SOLN
INTRAMUSCULAR | Status: AC
  Filled 2018-10-02: qty 1

## 2018-10-02 MED ORDER — PROMETHAZINE HCL 25 MG/ML IJ SOLN: 6.2500 mg | INTRAMUSCULAR | Status: DC | PRN

## 2018-10-02 MED ORDER — HYDROMORPHONE HCL 1 MG/ML IJ SOLN
INTRAMUSCULAR | Status: AC
  Filled 2018-10-02: qty 0.5

## 2018-10-02 MED ORDER — LIDOCAINE 2% (20 MG/ML) 5 ML SYRINGE
INTRAMUSCULAR | Status: AC
  Filled 2018-10-02: qty 5

## 2018-10-02 MED ORDER — MIDAZOLAM HCL 2 MG/2ML IJ SOLN
INTRAMUSCULAR | Status: AC
  Filled 2018-10-02: qty 2

## 2018-10-02 MED ORDER — CELECOXIB 200 MG PO CAPS
ORAL_CAPSULE | ORAL | Status: AC
  Filled 2018-10-02: qty 1

## 2018-10-02 MED ORDER — FENTANYL CITRATE (PF) 100 MCG/2ML IJ SOLN
INTRAMUSCULAR | Status: AC
  Filled 2018-10-02: qty 2

## 2018-10-02 MED ORDER — OXYCODONE HCL 5 MG/5ML PO SOLN: ORAL | 0 refills | Status: DC

## 2018-10-02 MED ORDER — ROPIVACAINE HCL 5 MG/ML IJ SOLN
INTRAMUSCULAR | Status: DC | PRN
  Administered 2018-10-02: 30 mL via PERINEURAL

## 2018-10-02 SURGICAL SUPPLY — 46 items
APPLIER CLIP 9.375 MED OPEN (MISCELLANEOUS) ×2
BINDER BREAST LRG (GAUZE/BANDAGES/DRESSINGS) IMPLANT
BINDER BREAST MEDIUM (GAUZE/BANDAGES/DRESSINGS) IMPLANT
BINDER BREAST XLRG (GAUZE/BANDAGES/DRESSINGS) IMPLANT
BINDER BREAST XXLRG (GAUZE/BANDAGES/DRESSINGS) IMPLANT
BLADE SURG 15 STRL LF DISP TIS (BLADE) ×1 IMPLANT
BLADE SURG 15 STRL SS (BLADE) ×1
CANISTER SUC SOCK COL 7IN (MISCELLANEOUS) IMPLANT
CANISTER SUCT 1200ML W/VALVE (MISCELLANEOUS) ×2 IMPLANT
CHLORAPREP W/TINT 26 (MISCELLANEOUS) ×2 IMPLANT
CLIP APPLIE 9.375 MED OPEN (MISCELLANEOUS) ×1 IMPLANT
COVER BACK TABLE REUSABLE LG (DRAPES) ×2 IMPLANT
COVER MAYO STAND REUSABLE (DRAPES) ×2 IMPLANT
COVER PROBE W GEL 5X96 (DRAPES) ×2 IMPLANT
COVER WAND RF STERILE (DRAPES) IMPLANT
DECANTER SPIKE VIAL GLASS SM (MISCELLANEOUS) IMPLANT
DERMABOND ADVANCED (GAUZE/BANDAGES/DRESSINGS) ×1
DERMABOND ADVANCED .7 DNX12 (GAUZE/BANDAGES/DRESSINGS) ×1 IMPLANT
DRAPE LAPAROSCOPIC ABDOMINAL (DRAPES) ×2 IMPLANT
DRAPE UTILITY XL STRL (DRAPES) ×2 IMPLANT
ELECT COATED BLADE 2.86 ST (ELECTRODE) ×2 IMPLANT
ELECT REM PT RETURN 9FT ADLT (ELECTROSURGICAL) ×2
ELECTRODE REM PT RTRN 9FT ADLT (ELECTROSURGICAL) ×1 IMPLANT
GLOVE BIOGEL PI IND STRL 8 (GLOVE) ×1 IMPLANT
GLOVE BIOGEL PI INDICATOR 8 (GLOVE) ×1
GLOVE ECLIPSE 8.0 STRL XLNG CF (GLOVE) ×2 IMPLANT
GOWN STRL REUS W/ TWL LRG LVL3 (GOWN DISPOSABLE) ×2 IMPLANT
GOWN STRL REUS W/TWL LRG LVL3 (GOWN DISPOSABLE) ×2
HEMOSTAT ARISTA ABSORB 3G PWDR (HEMOSTASIS) IMPLANT
HEMOSTAT SNOW SURGICEL 2X4 (HEMOSTASIS) IMPLANT
KIT MARKER MARGIN INK (KITS) ×2 IMPLANT
NDL SAFETY ECLIPSE 18X1.5 (NEEDLE) IMPLANT
NEEDLE HYPO 18GX1.5 SHARP (NEEDLE)
NEEDLE HYPO 25X1 1.5 SAFETY (NEEDLE) ×2 IMPLANT
NS IRRIG 1000ML POUR BTL (IV SOLUTION) ×2 IMPLANT
PACK BASIN DAY SURGERY FS (CUSTOM PROCEDURE TRAY) ×2 IMPLANT
PENCIL BUTTON HOLSTER BLD 10FT (ELECTRODE) ×2 IMPLANT
SLEEVE SCD COMPRESS KNEE MED (MISCELLANEOUS) ×2 IMPLANT
SPONGE LAP 4X18 RFD (DISPOSABLE) ×2 IMPLANT
SUT MNCRL AB 4-0 PS2 18 (SUTURE) ×2 IMPLANT
SUT VICRYL 3-0 CR8 SH (SUTURE) ×2 IMPLANT
SYR CONTROL 10ML LL (SYRINGE) ×2 IMPLANT
TOWEL GREEN STERILE FF (TOWEL DISPOSABLE) ×2 IMPLANT
TRAY FAXITRON CT DISP (TRAY / TRAY PROCEDURE) ×2 IMPLANT
TUBE CONNECTING 20X1/4 (TUBING) ×2 IMPLANT
YANKAUER SUCT BULB TIP NO VENT (SUCTIONS) ×2 IMPLANT

## 2018-10-02 NOTE — Progress Notes (Signed)
Nuc med inj performed by nuc med staff. Pt tol well with no additional sedation. VSS. Emotional support provided

## 2018-10-02 NOTE — Anesthesia Procedure Notes (Signed)
Procedure Name: LMA Insertion Date/Time: 10/02/2018 7:35 AM Performed by: Lieutenant Diego, CRNA Pre-anesthesia Checklist: Patient identified, Emergency Drugs available, Suction available and Patient being monitored Patient Re-evaluated:Patient Re-evaluated prior to induction Oxygen Delivery Method: Circle system utilized Preoxygenation: Pre-oxygenation with 100% oxygen Induction Type: IV induction Ventilation: Mask ventilation without difficulty LMA: LMA inserted LMA Size: 4.0 Number of attempts: 1 Placement Confirmation: positive ETCO2 and breath sounds checked- equal and bilateral Tube secured with: Tape Dental Injury: Teeth and Oropharynx as per pre-operative assessment

## 2018-10-02 NOTE — Anesthesia Procedure Notes (Signed)
Anesthesia Regional Block: Pectoralis block   Pre-Anesthetic Checklist: ,, timeout performed, Correct Patient, Correct Site, Correct Laterality, Correct Procedure, Correct Position, site marked, Risks and benefits discussed,  Surgical consent,  Pre-op evaluation,  At surgeon's request and post-op pain management  Laterality: Left  Prep: chloraprep       Needles:  Injection technique: Single-shot  Needle Type: Stimiplex     Needle Length: 9cm  Needle Gauge: 21     Additional Needles:   Procedures:,,,, ultrasound used (permanent image in chart),,,,  Narrative:  Start time: 10/02/2018 7:09 AM End time: 10/02/2018 7:14 AM Injection made incrementally with aspirations every 5 mL.  Performed by: Personally  Anesthesiologist: Lynda Rainwater, MD

## 2018-10-02 NOTE — Anesthesia Postprocedure Evaluation (Signed)
Anesthesia Post Note  Patient: Katrina Liu  Procedure(s) Performed: RIGHT BREAST LUMPECTOMY WITH RADIOACTIVE SEED AND RIGHT SENTINEL LYMPH NODE MAPPING (Right Breast)     Patient location during evaluation: PACU Anesthesia Type: General Level of consciousness: awake and alert Pain management: pain level controlled Vital Signs Assessment: post-procedure vital signs reviewed and stable Respiratory status: spontaneous breathing, nonlabored ventilation and respiratory function stable Cardiovascular status: blood pressure returned to baseline and stable Postop Assessment: no apparent nausea or vomiting Anesthetic complications: no    Last Vitals:  Vitals:   10/02/18 0845 10/02/18 0924  BP: (!) 141/80 137/85  Pulse: 67 64  Resp: 12 18  Temp:  36.6 C  SpO2: 100% 99%    Last Pain:  Vitals:   10/02/18 0924  TempSrc:   PainSc: 2                  Lynda Rainwater

## 2018-10-02 NOTE — Anesthesia Preprocedure Evaluation (Signed)
Anesthesia Evaluation  Patient identified by MRN, date of birth, ID band Patient awake    Reviewed: Allergy & Precautions, H&P , NPO status , Patient's Chart, lab work & pertinent test results, reviewed documented beta blocker date and time   History of Anesthesia Complications Negative for: history of anesthetic complications  Airway Mallampati: II       Dental  (+) Teeth Intact   Pulmonary neg pulmonary ROS,    Pulmonary exam normal breath sounds clear to auscultation       Cardiovascular Exercise Tolerance: Good hypertension,  Rhythm:regular Rate:Normal     Neuro/Psych  Headaches (migraines (menstrual timing) - has a migraine now ), negative psych ROS   GI/Hepatic Neg liver ROS, GERD  ,  Endo/Other  negative endocrine ROS  Renal/GU negative Renal ROS  negative genitourinary   Musculoskeletal   Abdominal   Peds  Hematology negative hematology ROS (+) Blood dyscrasia (takes iron pills), anemia ,   Anesthesia Other Findings Breast Cancer  Reproductive/Obstetrics negative OB ROS                             Anesthesia Physical  Anesthesia Plan  ASA: III  Anesthesia Plan: General   Post-op Pain Management:  Regional for Post-op pain   Induction: Intravenous  PONV Risk Score and Plan: 3 and Ondansetron, Dexamethasone and Midazolam  Airway Management Planned: LMA  Additional Equipment:   Intra-op Plan:   Post-operative Plan: Extubation in OR  Informed Consent: I have reviewed the patients History and Physical, chart, labs and discussed the procedure including the risks, benefits and alternatives for the proposed anesthesia with the patient or authorized representative who has indicated his/her understanding and acceptance.     Dental Advisory Given  Plan Discussed with: CRNA and Surgeon  Anesthesia Plan Comments:         Anesthesia Quick Evaluation

## 2018-10-02 NOTE — Interval H&P Note (Signed)
History and Physical Interval Note:  10/02/2018 7:17 AM  Katrina Liu  has presented today for surgery, with the diagnosis of RIGHT BREAST CANCER.  The various methods of treatment have been discussed with the patient and family. After consideration of risks, benefits and other options for treatment, the patient has consented to  Procedure(s): RIGHT BREAST LUMPECTOMY WITH RADIOACTIVE SEED AND RIGHT SENTINEL LYMPH NODE MAPPING (Right) as a surgical intervention.  The patient's history has been reviewed, patient examined, no change in status, stable for surgery.  I have reviewed the patient's chart and labs.  Questions were answered to the patient's satisfaction.     [place]

## 2018-10-02 NOTE — Discharge Instructions (Signed)
[place] Office Phone Number [phone]  BREAST BIOPSY/ PARTIAL MASTECTOMY: POST OP INSTRUCTIONS  Always review your discharge instruction sheet given to you by the facility where your surgery was performed.  IF YOU HAVE DISABILITY OR FAMILY LEAVE FORMS, YOU MUST BRING THEM TO THE OFFICE FOR PROCESSING.  DO NOT GIVE THEM TO YOUR DOCTOR.  1. A prescription for pain medication may be given to you upon discharge.  Take your pain medication as prescribed, if needed.  If narcotic pain medicine is not needed, then you may take acetaminophen (Tylenol) or ibuprofen (Advil) as needed. 2. Take your usually prescribed medications unless otherwise directed 3. If you need a refill on your pain medication, please contact your pharmacy.  They will contact our office to request authorization.  Prescriptions will not be filled after 5pm or on week-ends. 4. You should eat very light the first 24 hours after surgery, such as soup, crackers, pudding, etc.  Resume your normal diet the day after surgery. 5. Most patients will experience some swelling and bruising in the breast.  Ice packs and a good support bra will help.  Swelling and bruising can take several days to resolve.  6. It is common to experience some constipation if taking pain medication after surgery.  Increasing fluid intake and taking a stool softener will usually help or prevent this problem from occurring.  A mild laxative (Milk of Magnesia or Miralax) should be taken according to package directions if there are no bowel movements after 48 hours. 7. Unless discharge instructions indicate otherwise, you may remove your bandages 24-48 hours after surgery, and you may shower at that time.  You may have steri-strips (small skin tapes) in place directly over the incision.  These strips should be left on the skin for 7-10 days.  If your surgeon used skin glue on the incision, you may shower in 24 hours.  The glue will flake off over the  next 2-3 weeks.  Any sutures or staples will be removed at the office during your follow-up visit. 8. ACTIVITIES:  You may resume regular daily activities (gradually increasing) beginning the next day.  Wearing a good support bra or sports bra minimizes pain and swelling.  You may have sexual intercourse when it is comfortable. a. You may drive when you no longer are taking prescription pain medication, you can comfortably wear a seatbelt, and you can safely maneuver your car and apply brakes. b. RETURN TO WORK:  ______________________________________________________________________________________ 9. You should see your doctor in the office for a follow-up appointment approximately two weeks after your surgery.  Your doctors nurse will typically make your follow-up appointment when she calls you with your pathology report.  Expect your pathology report 2-3 business days after your surgery.  You may call to check if you do not hear from [place] after three days. 10. OTHER INSTRUCTIONS: _______________________________________________________________________________________________ _____________________________________________________________________________________________________________________________________ _____________________________________________________________________________________________________________________________________ _____________________________________________________________________________________________________________________________________  WHEN TO CALL YOUR DOCTOR: 1. Fever over 101.0 2. Nausea and/or vomiting. 3. Extreme swelling or bruising. 4. Continued bleeding from incision. 5. Increased pain, redness, or drainage from the incision.  The clinic staff is available to answer your questions during regular business hours.  Please dont hesitate to call and ask to speak to one of the nurses for clinical concerns.  If you have a medical emergency, go to the nearest  emergency room or call 911.  A surgeon from [facility] is always on call at the hospital.  For further questions, please visit [email or web address]  No Tylenol or Motrin until after 1 pm this afternoon.       Post Anesthesia Home Care Instructions  Activity: Get plenty of rest for the remainder of the day. A responsible individual must stay with you for 24 hours following the procedure.  For the next 24 hours, DO NOT: -Drive a car -Paediatric nurse -Drink alcoholic beverages -Take any medication unless instructed by your physician -Make any legal decisions or sign important papers.  Meals: Start with liquid foods such as gelatin or soup. Progress to regular foods as tolerated. Avoid greasy, spicy, heavy foods. If nausea and/or vomiting occur, drink only clear liquids until the nausea and/or vomiting subsides. Call your physician if vomiting continues.  Special Instructions/Symptoms: Your throat may feel dry or sore from the anesthesia or the breathing tube placed in your throat during surgery. If this causes discomfort, gargle with warm salt water. The discomfort should disappear within 24 hours.  If you had a scopolamine patch placed behind your ear for the management of post- operative nausea and/or vomiting:  1. The medication in the patch is effective for 72 hours, after which it should be removed.  Wrap patch in a tissue and discard in the trash. Wash hands thoroughly with soap and water. 2. You may remove the patch earlier than 72 hours if you experience unpleasant side effects which may include dry mouth, dizziness or visual disturbances. 3. Avoid touching the patch. Wash your hands with soap and water after contact with the patch.      Regional Anesthesia Blocks  1. Numbness or the inability to move the "blocked" extremity may last from 3-48 hours after placement. The length of time depends on the medication injected and your individual  response to the medication. If the numbness is not going away after 48 hours, call your surgeon.  2. The extremity that is blocked will need to be protected until the numbness is gone and the  Strength has returned. Because you cannot feel it, you will need to take extra care to avoid injury. Because it may be weak, you may have difficulty moving it or using it. You may not know what position it is in without looking at it while the block is in effect.  3. For blocks in the legs and feet, returning to weight bearing and walking needs to be done carefully. You will need to wait until the numbness is entirely gone and the strength has returned. You should be able to move your leg and foot normally before you try and bear weight or walk. You will need someone to be with you when you first try to ensure you do not fall and possibly risk injury.  4. Bruising and tenderness at the needle site are common side effects and will resolve in a few days.  5. Persistent numbness or new problems with movement should be communicated to the surgeon or the [place] [phone] [place] [phone]).

## 2018-10-02 NOTE — Transfer of Care (Signed)
Immediate Anesthesia Transfer of Care Note  Patient: Katrina Liu  Procedure(s) Performed: RIGHT BREAST LUMPECTOMY WITH RADIOACTIVE SEED AND RIGHT SENTINEL LYMPH NODE MAPPING (Right Breast)  Patient Location: PACU  Anesthesia Type:General and regional  Level of Consciousness: awake, alert  and oriented  Airway & Oxygen Therapy: Patient Spontanous Breathing and Patient connected to face mask oxygen  Post-op Assessment: Report given to RN and Post -op Vital signs reviewed and stable  Post vital signs: Reviewed and stable  Last Vitals:  Vitals Value Taken Time  BP    Temp    Pulse 65 10/02/2018  8:27 AM  Resp    SpO2 100 % 10/02/2018  8:27 AM  Vitals shown include unvalidated device data.  Last Pain:  Vitals:   10/02/18 0642  TempSrc: Oral  PainSc: 0-No pain         Complications: No apparent anesthesia complications

## 2018-10-02 NOTE — Op Note (Signed)
Katrina Liu  02/11/1961  [id number]  10/02/2018   Preoperative diagnosis: Stage I right breast cancer  Postoperative diagnosis: Same  Procedure: Right breast seed localized partial mastectomy with right axillary sentinel lymph node mapping with methylene blue dye of deep right axillary sentinel node  Surgeon: Turner Daniels, MD, FACS  Anesthesia: GA combined with regional for post-op pain     Drains: None  IV fluids: Per record  Clinical History and Indications: this patient presents for a seed localized partial mastectomy secondary to stage I right breast cancer.  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  She desires breast conservation.The procedure has been discussed with the patient. Alternatives to surgery have been discussed with the patient.  Risks of surgery include bleeding,  Infection,  Seroma formation, death,  and the need for further surgery.   The patient understands and wishes to proceed.Sentinel lymph node mapping and dissection has been discussed with the patient.  Risk of bleeding,  Infection,  Seroma formation,  Additional procedures,,  Shoulder weakness ,  Shoulder stiffness,  Nerve and blood vessel injury and reaction to the mapping dyes have been discussed.  Alternatives to surgery have been discussed with the patient.  The patient agrees to proceed.[name]  Description of procedure: The patient was seen in the holding area and the plans for the procedure reviewed. The right breast was marked as the operative side. The seed  localizing films were reviewed.  Patient underwent right pectoral block per anesthesia.  Neoprobe used to localize seed in holding area.  The patient was taken to the operating room and after satisfactory general anesthesia had been obtained the right breast was prepped and draped and the timeout was performed. 4 cc of methylene blue  dye were injected under the right nipple. The incision was made over the presumed area of the mass in the right upper quadrant. Skin flaps were raised and using cautery the area was completely excised. Bleeders were controlled with either cautery or sutures as needed.  Neoprobe used to identify seed and clip and all tissue around this was excised with a grossly negative margin.  Faxitron image revealed both seed and clip to be in specimen.  The cavity was clipped.  Neoprobe settings changed to technetium.  2 blue and hot sentinel nodes were removed.  Background counts approached 0.  These are in the deep level 1 axillary basin.  No adenopathy noted.    After achieving hemostasis, the incision was closed with 3-0 Vicryl, 4-0 Monocryl subcuticular, and Dermabond.  The patient tolerated the procedure well. There were no operative complications. All counts were correct.   EBL: 20 cc  Turner Daniels, MD, FACS 10/02/2018 8:31 AM

## 2018-10-02 NOTE — Progress Notes (Signed)
Assisted Dr. Miller with right, ultrasound guided, pectoralis block. Side rails up, monitors on throughout procedure. See vital signs in flow sheet. Tolerated Procedure well. 

## 2018-10-03 ENCOUNTER — Encounter (HOSPITAL_BASED_OUTPATIENT_CLINIC_OR_DEPARTMENT_OTHER): Payer: Self-pay | Admitting: Surgery

## 2018-10-15 ENCOUNTER — Other Ambulatory Visit: Payer: Self-pay

## 2018-10-15 ENCOUNTER — Inpatient Hospital Stay: Payer: Managed Care, Other (non HMO) | Attending: Hematology and Oncology

## 2018-10-15 DIAGNOSIS — Z452 Encounter for adjustment and management of vascular access device: Secondary | ICD-10-CM | POA: Insufficient documentation

## 2018-10-15 DIAGNOSIS — Z171 Estrogen receptor negative status [ER-]: Secondary | ICD-10-CM | POA: Insufficient documentation

## 2018-10-15 DIAGNOSIS — C50411 Malignant neoplasm of upper-outer quadrant of right female breast: Secondary | ICD-10-CM | POA: Insufficient documentation

## 2018-10-15 DIAGNOSIS — Z95828 Presence of other vascular implants and grafts: Secondary | ICD-10-CM

## 2018-10-15 MED ORDER — HEPARIN SOD (PORK) LOCK FLUSH 100 UNIT/ML IV SOLN
500.0000 [IU] | Freq: Once | INTRAVENOUS | Status: AC
  Administered 2018-10-15: 500 [IU]
  Filled 2018-10-15: qty 5

## 2018-10-15 MED ORDER — SODIUM CHLORIDE 0.9% FLUSH
10.0000 mL | Freq: Once | INTRAVENOUS | Status: AC
  Administered 2018-10-15: 10 mL
  Filled 2018-10-15: qty 10

## 2018-10-16 NOTE — Progress Notes (Signed)
Patient Care Team: [name], MD as PCP - General (Family Medicine) Excell Seltzer, MD as Consulting Physician (General Surgery) Nicholas Lose, MD as Consulting Physician (Hematology and Oncology) Kyung Rudd, MD as Consulting Physician (Radiation Oncology)  DIAGNOSIS:    ICD-10-CM   1. Malignant neoplasm of upper-outer quadrant of right breast in female, estrogen receptor negative ([place])  C50.411    Z17.1     SUMMARY OF ONCOLOGIC HISTORY: Oncology History  Malignant neoplasm of upper-outer quadrant of right breast in female, estrogen receptor negative ([place])  05/29/2018 Initial Diagnosis   Right axillary pain which led to a mammogram and ultrasound that revealed a new mass in the superior UOQ tail of the breast 10 o'clock position by ultrasound measured 8 mm, axilla negative, biopsy revealed grade 3 IDC triple negative with a Ki-67 of 20%, T1BN0 stage Ib clinical stage   06/04/2018 Cancer Staging   Staging form: Breast, AJCC 8th Edition - Clinical stage from 06/04/2018: Stage IB (cT1b, cN0, cM0, G3, ER-, PR-, HER2-) - Signed by Nicholas Lose, MD on 06/04/2018   06/17/2018 -  Neo-Adjuvant Chemotherapy   Dose dense Adriamycin and Cytoxan x4 followed by Taxol and carboplatin   07/15/2018 Genetic Testing   No pathogenic variants identified (negative testing) on the [facility] CancerNext+RNAinsight panel. The CancerNext gene panel offered by [employer] includes sequencing and rearrangement analysis for the following 34 genes:   APC, ATM, BARD1, BMPR1A, BRCA1, BRCA2, BRIP1, CDH1, CDK4, CDKN2A, CHEK2, DICER1, EPCAM, GREM1, HOXB13MLH1, MRE11A, MSH2, MSH6, MUTYH, NBN, NF1, PALB2, PMS2, POLD1, POLE, PTEN, RAD50, RAD51D, RAD51C, SMAD4, SMARCA4, STK11, and TP53. The report date is 07/15/2018.   10/02/2018 Surgery   Right Lumpectomy ([facility]): no residual carcinoma s/p neoadjuvant treatment, 0/3 lymph nodes negative for carcinoma.      CHIEF COMPLIANT: Follow-up s/p lumpectomy to review pathology  and discuss further treatment   INTERVAL HISTORY: Katrina Liu is a 58 y.o. with above-mentioned history of right breast cancer who completed neoadjuvant chemotherapy and underwent a lumpectomy on 10/02/18 with Dr. Brantley Stage. Pathology confirmed no residual carcinoma and 0/3 lymph nodes negative for carcinoma. She presents to the clinic today to review the pathology report and discuss further treatment.   REVIEW OF SYSTEMS:   Constitutional: Denies fevers, chills or abnormal weight loss Eyes: Denies blurriness of vision Ears, nose, mouth, throat, and face: Denies mucositis or sore throat Respiratory: Denies cough, dyspnea or wheezes Cardiovascular: Denies palpitation, chest discomfort Gastrointestinal: Denies nausea, heartburn or change in bowel habits Skin: Denies abnormal skin rashes Lymphatics: Denies new lymphadenopathy or easy bruising Neurological: Denies numbness, tingling or new weaknesses Behavioral/Psych: Mood is stable, no new changes  Extremities: No lower extremity edema Breast: Recent right lumpectomy All other systems were reviewed with the patient and are negative.  I have reviewed the past medical history, past surgical history, social history and family history with the patient and they are unchanged from previous note.  ALLERGIES:  is allergic to lisinopril.  MEDICATIONS:  Current Outpatient Medications  Medication Sig Dispense Refill  . acyclovir (ZOVIRAX) 200 MG capsule Take 1 capsule (200 mg total) by mouth 5 (five) times daily. 35 capsule 0  . Ascorbic Acid (VITAMIN C) 500 MG CAPS Take 500 mg by mouth daily.     [name] aspirin EC 81 MG EC tablet Take 1 tablet (81 mg total) by mouth daily. 90 tablet 0  . Biotin 10000 MCG TABS Take 10,000 mcg by mouth daily.    . Calcium Carbonate-Vitamin D (CALCIUM 500/D  PO) Take by mouth.    . chlorthalidone (HYGROTON) 25 MG tablet Take 1 tablet (25 mg total) by mouth daily. 90 tablet 2  . Cyanocobalamin (VITAMIN B 12 PO) Take by  mouth.    . dicyclomine (BENTYL) 10 MG capsule Take 1 capsule (10 mg total) by mouth 2 (two) times daily as needed for spasms. 30 capsule 1  . Flaxseed, Linseed, (FLAX SEED OIL) 1000 MG CAPS Take 1,000 mg by mouth once a week.    . gabapentin (NEURONTIN) 300 MG capsule Take 1 capsule (300 mg total) by mouth at bedtime. 30 capsule 3  . labetalol (NORMODYNE) 100 MG tablet Take 1 tablet (100 mg total) by mouth 2 (two) times daily. 180 tablet 3  . lidocaine (XYLOCAINE) 2 % solution Use as directed 15 mLs in the mouth or throat every 3 (three) hours as needed for mouth pain. 200 mL 2  . magic mouthwash SOLN Take 5 mLs by mouth 4 (four) times daily as needed for mouth pain. Swish and swallow or spit 240 mL 2  . mometasone (ELOCON) 0.1 % ointment Apply 1 application topically as needed (dry skin).     . Multiple Vitamin (MULTIVITAMIN) tablet Take 1 tablet by mouth daily.    [name] nystatin (MYCOSTATIN) 100000 UNIT/ML suspension Take 5 mLs (500,000 Units total) by mouth 4 (four) times daily. 150 mL 0  . oxyCODONE (ROXICODONE) 5 MG/5ML solution 5 to 10 mg Q 4 hours prn pain 150 mL 0  . potassium chloride SA (K-DUR,KLOR-CON) 20 MEQ tablet Take 1 tablet (20 mEq total) by mouth daily. 30 tablet 2  . triamcinolone cream (KENALOG) 0.1 % Apply to hands TID prn for rash 80 g 1  . Turmeric 500 MG TABS Take 500 mg by mouth daily.    [name] zinc gluconate 50 MG tablet Take 50 mg by mouth daily.     No current facility-administered medications for this visit.     PHYSICAL EXAMINATION: ECOG PERFORMANCE STATUS: 1 - Symptomatic but completely ambulatory  Vitals:   10/17/18 1028  BP: (!) 150/93  Pulse: 88  Resp: 17  Temp: 98.5 F (36.9 C)  SpO2: 100%   Filed Weights   10/17/18 1028  Weight: 144 lb 3.2 oz (65.4 kg)    GENERAL: alert, no distress and comfortable SKIN: skin color, texture, turgor are normal, no rashes or significant lesions EYES: normal, Conjunctiva are pink and non-injected, sclera clear  OROPHARYNX: no exudate, no erythema and lips, buccal mucosa, and tongue normal  NECK: supple, thyroid normal size, non-tender, without nodularity LYMPH: no palpable lymphadenopathy in the cervical, axillary or inguinal LUNGS: clear to auscultation and percussion with normal breathing effort HEART: regular rate & rhythm and no murmurs and no lower extremity edema ABDOMEN: abdomen soft, non-tender and normal bowel sounds MUSCULOSKELETAL: no cyanosis of digits and no clubbing  NEURO: alert & oriented x 3 with fluent speech, no focal motor/sensory deficits EXTREMITIES: No lower extremity edema  LABORATORY DATA:  I have reviewed the data as listed CMP Latest Ref Rng & Units 09/29/2018 09/03/2018 08/25/2018  Glucose 70 - 99 mg/dL 91 110(H) 96  BUN 6 - 20 mg/dL '12 9 10  ' Creatinine 0.44 - 1.00 mg/dL 0.93 0.82 0.85  Sodium 135 - 145 mmol/L 141 138 139  Potassium 3.5 - 5.1 mmol/L 3.8 4.0 3.9  Chloride 98 - 111 mmol/L 104 105 106  CO2 22 - 32 mmol/L '26 25 23  ' Calcium 8.9 - 10.3 mg/dL 9.9 9.1 9.2  Total Protein 6.5 - 8.1 g/dL 6.8 6.4(L) 6.7  Total Bilirubin 0.3 - 1.2 mg/dL 0.8 0.3 0.4  Alkaline Phos 38 - 126 U/L 77 68 80  AST 15 - 41 U/L 22 13(L) 46(H)  ALT 0 - 44 U/L 18 14 57(H)    Lab Results  Component Value Date   WBC 2.9 (L) 09/29/2018   HGB 11.9 (L) 09/29/2018   HCT 36.7 09/29/2018   MCV 94.8 09/29/2018   PLT 261 09/29/2018   NEUTROABS 1.4 (L) 09/29/2018    ASSESSMENT & PLAN:  Malignant neoplasm of upper-outer quadrant of right breast in female, estrogen receptor negative ([facility]) 05/29/2018:Right axillary pain which led to a mammogram and ultrasound that revealed a new mass in the superior UOQ tail of the breast 10 o'clock position by ultrasound measured 8 mm, axilla negative, 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 Neoadjuvant chemotherapy with Adriamycin and Cytoxan dose dense 4 followed byTaxolweekly  2and carboplatin every 3 weeks ( Discontinued due to toxicities) 2. Followed by breast conserving surgery with sentinel lymph node study 10/02/2018 3. Followed by adjuvant radiation therapy Genetics negative ---------------------------------------------------------------------------------------------------------------------------------- Breast MRi: Decreased size from 1.1 cm to 0.7 cm 10/02/2018:Right Lumpectomy ([facility]): no residual carcinoma s/p neoadjuvant treatment, 0/3 lymph nodes negative for carcinoma.   Because she had a pathologic complete response, she does not need any further adjuvant systemic treatment. She will proceed with radiation and followed by surveillance. We will have her port removed.  19-monthfollow-up for survivorship care plan visit  No orders of the defined types were placed in this encounter.  The patient has a good understanding of the overall plan. she agrees with it. she will call with any problems that may develop before the next visit here.  GNicholas Lose MD 10/17/2018  IJulious OkaDorshimer am acting as scribe for Dr. VNicholas Lose  I have reviewed the above documentation for accuracy and completeness, and I agree with the above.

## 2018-10-17 ENCOUNTER — Other Ambulatory Visit: Payer: Self-pay | Admitting: Medical

## 2018-10-17 ENCOUNTER — Inpatient Hospital Stay (HOSPITAL_BASED_OUTPATIENT_CLINIC_OR_DEPARTMENT_OTHER): Payer: Managed Care, Other (non HMO) | Admitting: Hematology and Oncology

## 2018-10-17 ENCOUNTER — Other Ambulatory Visit: Payer: Self-pay

## 2018-10-17 DIAGNOSIS — C50411 Malignant neoplasm of upper-outer quadrant of right female breast: Secondary | ICD-10-CM | POA: Diagnosis not present

## 2018-10-17 DIAGNOSIS — E876 Hypokalemia: Secondary | ICD-10-CM

## 2018-10-17 DIAGNOSIS — Z171 Estrogen receptor negative status [ER-]: Secondary | ICD-10-CM | POA: Diagnosis not present

## 2018-10-17 NOTE — Telephone Encounter (Signed)
Advise refill.  Lab 09-29-2018, K+ = 3.8

## 2018-10-17 NOTE — Assessment & Plan Note (Signed)
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 Neoadjuvant chemotherapy with Adriamycin and Cytoxan dose dense 4 followed byTaxolweekly 2and carboplatin every 3 weeks ( Discontinued due to toxicities) 2. Followed by breast conserving surgery with sentinel lymph node study 10/02/2018 3. Followed by adjuvant radiation therapy Genetics negative ---------------------------------------------------------------------------------------------------------------------------------- Breast MRi: Decreased size from 1.1 cm to 0.7 cm 10/02/2018:Right Lumpectomy ([facility]): no residual carcinoma s/p neoadjuvant treatment, 0/3 lymph nodes negative for carcinoma.   Because she had a pathologic complete response, she does not need any further adjuvant systemic treatment. She will proceed with radiation and followed by surveillance. We will have her port removed.  46-monthfollow-up for survivorship care plan visit

## 2018-10-21 ENCOUNTER — Other Ambulatory Visit: Payer: Self-pay

## 2018-10-21 ENCOUNTER — Encounter: Payer: Self-pay | Admitting: Radiation Oncology

## 2018-10-21 ENCOUNTER — Ambulatory Visit
Admission: RE | Admit: 2018-10-21 | Discharge: 2018-10-21 | Disposition: A | Discharge status: Home / Self Care | Payer: Managed Care, Other (non HMO) | Source: Ambulatory Visit | Attending: Radiation Oncology | Admitting: Radiation Oncology

## 2018-10-21 ENCOUNTER — Telehealth: Payer: Self-pay | Admitting: Hematology and Oncology

## 2018-10-21 DIAGNOSIS — C50411 Malignant neoplasm of upper-outer quadrant of right female breast: Secondary | ICD-10-CM | POA: Insufficient documentation

## 2018-10-21 DIAGNOSIS — Z510 Encounter for antineoplastic radiation therapy: Secondary | ICD-10-CM | POA: Diagnosis not present

## 2018-10-21 DIAGNOSIS — Z171 Estrogen receptor negative status [ER-]: Secondary | ICD-10-CM | POA: Insufficient documentation

## 2018-10-21 NOTE — Progress Notes (Signed)
Radiation Oncology         [phone] ________________________________  Name: Katrina Liu        MRN: [id number]  Date of Service: 10/21/2018 DOB: 1960/12/26  EB:RAXENM, [name], MD  Nicholas Lose, MD     REFERRING PHYSICIAN: Nicholas Lose, MD   DIAGNOSIS: The encounter diagnosis was Malignant neoplasm of upper-outer quadrant of right breast in female, estrogen receptor negative ([place]).   HISTORY OF PRESENT ILLNESS: Katrina Liu is a 58 y.o. female originally seen in the multidisciplinary breast clinic for a new diagnosis of right breast cancer. The patient was noted to have axillary pain and a diagnostic mammogram revealed a new mass in the tail of the right breast at 10:00 measuring 8 mm. Her axilla was negative for adenopathy. 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 MRI of the breast did not reveal any new disease or adenopathy. She underwent neoadjuvant chemotherapy with adriamycin and Cytoxan followed by taxol. Her chemotherapy course ended early on 08/19/2018 with cycle #8 due to mucositis. Her post neoadjuvant MRI showed reduction in the size of her disease on 09/12/2018 without new disease or adenopathy. She underwent lumpectomy and sentinel node biopsy on 10/02/2018 that did not reveal any residual carcinoma, and 3 sampled nodes were all negative for disease. She is seen via Webex today to discuss adjuvant treatment options.     PREVIOUS RADIATION THERAPY: No   PAST MEDICAL HISTORY:  Past Medical History:  Diagnosis Date  . Anemia   . Family history of prostate cancer   . GERD (gastroesophageal reflux disease)    h/o - not now  . Headache(784.0)   . Hypertension   . S/P abdominal hysterectomy 04/17/2011  . Stroke [facility])    no residual       PAST SURGICAL HISTORY: Past Surgical History:  Procedure Laterality Date  . ABDOMINAL HYSTERECTOMY  04/16/2011   Procedure: HYSTERECTOMY ABDOMINAL;  Surgeon: Marvene Staff,  MD;  Location: [place] [facility];  Service: Gynecology;  Laterality: N/A;  . BREAST BIOPSY Left 2013  . BREAST LUMPECTOMY WITH RADIOACTIVE SEED AND SENTINEL LYMPH NODE BIOPSY Right 10/02/2018   Procedure: RIGHT BREAST LUMPECTOMY WITH RADIOACTIVE SEED AND RIGHT SENTINEL LYMPH NODE MAPPING;  Surgeon: Erroll Luna, MD;  Location: [place];  Service: General;  Laterality: Right;  . KIDNEY STONE SURGERY    . LAPAROTOMY     ectopic pregnancy  . LOOP RECORDER IMPLANT N/A 12/24/2012   Procedure: LOOP RECORDER IMPLANT;  Surgeon: Deboraha Sprang, MD;  Location: [facility] CATH LAB;  Service: Cardiovascular;  Laterality: N/A;  . LOOP RECORDER REMOVAL N/A 08/15/2016   Procedure: Loop Recorder Removal;  Surgeon: Deboraha Sprang, MD;  Location: [place] CV LAB;  Service: Cardiovascular;  Laterality: N/A;  . SHOULDER ARTHROSCOPY    . TEE WITHOUT CARDIOVERSION N/A 12/24/2012   Procedure: TRANSESOPHAGEAL ECHOCARDIOGRAM (TEE);  Surgeon: Jettie Booze, MD;  Location: [facility] ENDOSCOPY;  Service: Cardiovascular;  Laterality: N/A;     FAMILY HISTORY:  Family History  Problem Relation Age of Onset  . Stroke Father 33  . Lung cancer Father   . Prostate cancer Paternal Uncle        dx 16's?  . Diabetes Maternal Grandfather   . Breast cancer Neg Hx      SOCIAL HISTORY:  reports that she has never smoked. She has never used smokeless tobacco. She reports that she does not drink alcohol or use  drugs. The patient is divorced and lives in [place]. 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   ALLERGIES: Lisinopril   MEDICATIONS:  Current Outpatient Medications  Medication Sig Dispense Refill  . Ascorbic Acid (VITAMIN C) 500 MG CAPS Take 500 mg by mouth daily.     [name] aspirin EC 81 MG EC tablet Take 1 tablet (81 mg total) by mouth daily. 90 tablet 0  . Calcium Carbonate-Vitamin D (CALCIUM 500/D PO) Take by mouth.    . chlorthalidone (HYGROTON) 25 MG tablet Take 1 tablet  (25 mg total) by mouth daily. 90 tablet 2  . Cyanocobalamin (VITAMIN B 12 PO) Take by mouth.    . Flaxseed, Linseed, (FLAX SEED OIL) 1000 MG CAPS Take 1,000 mg by mouth once a week.    . labetalol (NORMODYNE) 100 MG tablet Take 1 tablet (100 mg total) by mouth 2 (two) times daily. 180 tablet 3  . mometasone (ELOCON) 0.1 % ointment Apply 1 application topically as needed (dry skin).     . Multiple Vitamin (MULTIVITAMIN) tablet Take 1 tablet by mouth daily.    . potassium chloride SA (K-DUR,KLOR-CON) 20 MEQ tablet Take 1 tablet (20 mEq total) by mouth daily. 30 tablet 2  . zinc gluconate 50 MG tablet Take 50 mg by mouth daily.    [name] acyclovir (ZOVIRAX) 200 MG capsule Take 1 capsule (200 mg total) by mouth 5 (five) times daily. (Patient not taking: Reported on 10/21/2018) 35 capsule 0  . Biotin 10000 MCG TABS Take 10,000 mcg by mouth daily.    [name] dicyclomine (BENTYL) 10 MG capsule Take 1 capsule (10 mg total) by mouth 2 (two) times daily as needed for spasms. (Patient not taking: Reported on 10/21/2018) 30 capsule 1  . gabapentin (NEURONTIN) 300 MG capsule Take 1 capsule (300 mg total) by mouth at bedtime. (Patient not taking: Reported on 10/21/2018) 30 capsule 3  . lidocaine (XYLOCAINE) 2 % solution Use as directed 15 mLs in the mouth or throat every 3 (three) hours as needed for mouth pain. (Patient not taking: Reported on 10/21/2018) 200 mL 2  . magic mouthwash SOLN Take 5 mLs by mouth 4 (four) times daily as needed for mouth pain. Swish and swallow or spit (Patient not taking: Reported on 10/21/2018) 240 mL 2  . nystatin (MYCOSTATIN) 100000 UNIT/ML suspension Take 5 mLs (500,000 Units total) by mouth 4 (four) times daily. (Patient not taking: Reported on 10/21/2018) 150 mL 0  . oxyCODONE (ROXICODONE) 5 MG/5ML solution 5 to 10 mg Q 4 hours prn pain (Patient not taking: Reported on 10/21/2018) 150 mL 0  . triamcinolone cream (KENALOG) 0.1 % Apply to hands TID prn for rash (Patient not taking: Reported on  10/21/2018) 80 g 1  . Turmeric 500 MG TABS Take 500 mg by mouth daily.     No current facility-administered medications for this encounter.      REVIEW OF SYSTEMS: On review of systems, the patient reports that she is doing well overall. She is getting over mucousitis symptoms in her mouth, but is now able to eat a little more solid food. She reports she's healing well from surgery. Her hair is growing back as well. She denies any chest pain, shortness of breath, cough, fevers, chills, night sweats, unintended weight changes. She denies any bowel or bladder disturbances, and denies abdominal pain, nausea or vomiting. She denies any new musculoskeletal or joint aches or pains. A complete review of systems is obtained  and is otherwise negative.     PHYSICAL EXAM:  Wt Readings from Last 3 Encounters:  10/17/18 144 lb 3.2 oz (65.4 kg)  10/02/18 140 lb 14 oz (63.9 kg)  09/03/18 139 lb 3.2 oz (63.1 kg)   Temp Readings from Last 3 Encounters:  10/17/18 98.5 F (36.9 C) (Oral)  10/02/18 97.9 F (36.6 C)  09/03/18 97.6 F (36.4 C) (Oral)   BP Readings from Last 3 Encounters:  10/17/18 (!) 150/93  10/02/18 137/85  09/03/18 119/68   Pulse Readings from Last 3 Encounters:  10/17/18 88  10/02/18 64  09/03/18 92    In general this is a well appearing African American female in no acute distress. She's alert and oriented x4 and appropriate throughout the examination. Cardiopulmonary assessment is negative for acute distress and she exhibits normal effort. Breast exam is deferred.  ECOG = 1  0 - Asymptomatic (Fully active, able to carry on all predisease activities without restriction)  1 - Symptomatic but completely ambulatory (Restricted in physically strenuous activity but ambulatory and able to carry out work of a light or sedentary nature. For example, light housework, office work)  2 - Symptomatic, <50% in bed during the day (Ambulatory and capable of all self care but unable to  carry out any work activities. Up and about more than 50% of waking hours)  3 - Symptomatic, >50% in bed, but not bedbound (Capable of only limited self-care, confined to bed or chair 50% or more of waking hours)  4 - Bedbound (Completely disabled. Cannot carry on any self-care. Totally confined to bed or chair)  5 - Death   Eustace Pen MM, Creech RH, Tormey DC, et al. [phone]). "Toxicity and response criteria of the [facility]". [place] Oncol. 5 (6): 649-55    LABORATORY DATA:  Lab Results  Component Value Date   WBC 2.9 (L) 09/29/2018   HGB 11.9 (L) 09/29/2018   HCT 36.7 09/29/2018   MCV 94.8 09/29/2018   PLT 261 09/29/2018   Lab Results  Component Value Date   NA 141 09/29/2018   K 3.8 09/29/2018   CL 104 09/29/2018   CO2 26 09/29/2018   Lab Results  Component Value Date   ALT 18 09/29/2018   AST 22 09/29/2018   ALKPHOS 77 09/29/2018   BILITOT 0.8 09/29/2018      RADIOGRAPHY: Nm Sentinel Node Inj-no Rpt (breast)  Result Date: 10/02/2018 Sulfur colloid was injected by the nuclear medicine technologist for melanoma sentinel node.   Mm Breast Surgical Specimen  Result Date: 10/02/2018 CLINICAL DATA:  Evaluate surgical specimen following RIGHT lumpectomy for breast cancer. EXAM: SPECIMEN RADIOGRAPH OF THE RIGHT BREAST COMPARISON:  Previous exam(s). FINDINGS: Status post excision of the RIGHT breast. The radioactive seed and biopsy marker clip are present and completely intact. IMPRESSION: Specimen radiograph of the RIGHT breast. Electronically Signed   By: Margarette Canada M.D.   On: 10/02/2018 08:11   Mm Rt Radioactive Seed Loc Mammo Guide  Result Date: 10/01/2018 CLINICAL DATA:  Patient has recently diagnosed right breast carcinoma. She presents for radioactive seed localization prior to surgical excision. EXAM: MAMMOGRAPHIC GUIDED RADIOACTIVE SEED LOCALIZATION OF THE RIGHT BREAST COMPARISON:  Previous exam(s). FINDINGS: Patient presents for radioactive  seed localization prior to surgical excision. I met with the patient and we discussed the procedure of seed localization including benefits and alternatives. We discussed the high likelihood of a successful procedure. We discussed the risks of the procedure including infection,  bleeding, tissue injury and further surgery. We discussed the low dose of radioactivity involved in the procedure. Informed, written consent was given. The usual time-out protocol was performed immediately prior to the procedure. Using mammographic guidance, sterile technique, 1% lidocaine and an I-125 radioactive seed, the ribbon shaped biopsy clip near the axillary tail region of the upper-outer quadrant was localized using a superior approach. The follow-up mammogram images confirm the seed in the expected location and were marked for Dr. Brantley Stage. Follow-up survey of the patient confirms presence of the radioactive seed. Order number of I-125 seed:  [id number]. Total activity: Insert 1.121 millicuries reference Date: 08/07/2018. The patient tolerated the procedure well and was released from the Breast Center. She was given instructions regarding seed removal. IMPRESSION: Radioactive seed localization right breast. No apparent complications. Electronically Signed   By: Lajean Manes M.D.   On: 10/01/2018 14:11       IMPRESSION/PLAN: 1. 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 Dr. Lisbeth Renshaw discusses the pathology findings and reviews the nature of right breast disease. She would benefit from adjuvant radiotherapy to reduce the risks of local recurrence. We discussed the risks, benefits, short, and long term effects of radiotherapy, and the patient is interested in proceeding. Dr. Lisbeth Renshaw discusses the delivery and logistics of radiotherapy and anticipates a course of 6 1/2 weeks of radiotherapy. We reviewed verbal consent. She will come today  for simulation and sign this formally. We will plan to start treatment in the next 2-3 weeks.   This encounter was provided by telemedicine platform Webex.  The patient has given verbal consent for this type of encounter and has been advised to only accept a meeting of this type in a secure network environment. The time spent during this encounter was 45 minutes. The attendants for this meeting include Mardene Sayer, LPN, Dr. Lisbeth Renshaw, Hayden Pedro  and Jackelyn Hoehn.  During the encounter,  Mardene Sayer, LPN, Dr. Lisbeth Renshaw, and Hayden Pedro were located at [facility] Radiation Oncology Department.  Katrina Liu was located at home.    The above documentation reflects my direct findings during this shared patient visit. Please see the separate note by Dr. Lisbeth Renshaw on this date for the remainder of the patient's plan of care.    Carola Rhine, PAC

## 2018-10-21 NOTE — Telephone Encounter (Signed)
I talk with patient regarding schedule  

## 2018-10-22 ENCOUNTER — Other Ambulatory Visit: Payer: Self-pay | Admitting: Radiation Oncology

## 2018-10-22 DIAGNOSIS — Z171 Estrogen receptor negative status [ER-]: Secondary | ICD-10-CM

## 2018-10-22 DIAGNOSIS — C50411 Malignant neoplasm of upper-outer quadrant of right female breast: Secondary | ICD-10-CM

## 2018-10-22 NOTE — Progress Notes (Signed)
I saw Ms. Emery yesterday to review consent at the time of her simulation. She was examined. She has a well healing incision along the axillary tail of the right breast with about 3 cm of fluctuance consistent with seroma vs lymphocele. We discussed that this needs to resolve prior to radiotherapy and she will see surgery next week. I also recommended referral to PT to eval as well.     Carola Rhine, PAC

## 2018-10-23 ENCOUNTER — Encounter: Payer: Self-pay | Admitting: *Deleted

## 2018-10-23 DIAGNOSIS — C50411 Malignant neoplasm of upper-outer quadrant of right female breast: Secondary | ICD-10-CM | POA: Diagnosis not present

## 2018-10-27 ENCOUNTER — Ambulatory Visit: Payer: Managed Care, Other (non HMO) | Attending: Radiation Oncology | Admitting: Physical Therapy

## 2018-10-27 ENCOUNTER — Encounter: Payer: Self-pay | Admitting: Physical Therapy

## 2018-10-27 ENCOUNTER — Other Ambulatory Visit: Payer: Self-pay

## 2018-10-27 ENCOUNTER — Ambulatory Visit: Payer: Self-pay | Admitting: Surgery

## 2018-10-27 DIAGNOSIS — R293 Abnormal posture: Secondary | ICD-10-CM | POA: Insufficient documentation

## 2018-10-27 DIAGNOSIS — Z483 Aftercare following surgery for neoplasm: Secondary | ICD-10-CM | POA: Insufficient documentation

## 2018-10-27 NOTE — Therapy (Signed)
[place], [place], [address] Phone: [phone]   Fax:  [phone]  Physical Therapy Evaluation  Patient Details  Name: Katrina Liu MRN: [id number] Date of Birth: 31-Oct-1960 Referring Provider (PT): Shona Simpson    Encounter Date: 10/27/2018  PT End of Session - 10/27/18 1841    Visit Number  1    Number of Visits  1    Activity Tolerance  Patient tolerated treatment well    Behavior During Therapy  [facility] for tasks assessed/performed       Past Medical History:  Diagnosis Date  . Anemia   . Family history of prostate cancer   . GERD (gastroesophageal reflux disease)    h/o - not now  . Headache(784.0)   . Hypertension   . S/P abdominal hysterectomy 04/17/2011  . Stroke [facility])    no residual    Past Surgical History:  Procedure Laterality Date  . ABDOMINAL HYSTERECTOMY  04/16/2011   Procedure: HYSTERECTOMY ABDOMINAL;  Surgeon: Marvene Staff, MD;  Location: [place] [facility];  Service: Gynecology;  Laterality: N/A;  . BREAST BIOPSY Left 2013  . BREAST LUMPECTOMY WITH RADIOACTIVE SEED AND SENTINEL LYMPH NODE BIOPSY Right 10/02/2018   Procedure: RIGHT BREAST LUMPECTOMY WITH RADIOACTIVE SEED AND RIGHT SENTINEL LYMPH NODE MAPPING;  Surgeon: Erroll Luna, MD;  Location: [place];  Service: General;  Laterality: Right;  . KIDNEY STONE SURGERY    . LAPAROTOMY     ectopic pregnancy  . LOOP RECORDER IMPLANT N/A 12/24/2012   Procedure: LOOP RECORDER IMPLANT;  Surgeon: Deboraha Sprang, MD;  Location: [facility] CATH LAB;  Service: Cardiovascular;  Laterality: N/A;  . LOOP RECORDER REMOVAL N/A 08/15/2016   Procedure: Loop Recorder Removal;  Surgeon: Deboraha Sprang, MD;  Location: [place] CV LAB;  Service: Cardiovascular;  Laterality: N/A;  . SHOULDER ARTHROSCOPY    . TEE WITHOUT CARDIOVERSION N/A 12/24/2012   Procedure: TRANSESOPHAGEAL ECHOCARDIOGRAM (TEE);  Surgeon: Jettie Booze, MD;   Location: [place];  Service: Cardiovascular;  Laterality: N/A;    There were no vitals filed for this visit.   Subjective Assessment - 10/27/18 1543    Subjective  Pt had a seroma in right axilla after surgery that has resolved. She saw Dr. Brantley Stage    Pertinent History  Right cancer diagnosed in January 2020 and has chemotherapy for 8 weeks she had to stop taxol due to mouth sores and chemotherapy in hands and feet.  and lumpictomy on 10/02/2018 and will have radiation starting July 6    Patient Stated Goals  to make sure you are ok    Currently in Pain?  No/denies         [facility] PT Assessment - 10/27/18 0001      Assessment   Medical Diagnosis  right breast cancer    Referring Provider (PT)  Shona Simpson     Onset Date/Surgical Date  05/29/18    Hand Dominance  Left      Balance Screen   Has the patient fallen in the past 6 months  No    Has the patient had a decrease in activity level because of a fear of falling?   No    Is the patient reluctant to leave their home because of a fear of falling?   No      Home Social worker  Private residence    Living Arrangements  Alone    Available Help  at Discharge  Family      Prior Function   Level of Independence  Independent    Leisure  pt an exerciser, but hasn't been doing any wieight lifting since surgery       Cognition   Overall Cognitive Status  Within Functional Limits for tasks assessed      Observation/Other Assessments   Observations  pt with healing incision in left axilla.  She said she used to have some swelling near incision but it has resolved.    lipoma in left scapular area that she has had a long time    Quick DASH   0      Coordination   Gross Motor Movements are Fluid and Coordinated  Yes      Posture/Postural Control   Posture/Postural Control  Postural limitations    Postural Limitations  Rounded Shoulders;Forward head;Increased lumbar lordosis      ROM / Strength   AROM /  PROM / Strength  AROM;Strength      AROM   Overall AROM   Within functional limits for tasks performed    AROM Assessment Site  Shoulder    Right/Left Shoulder  Right;Left    Right Shoulder Flexion  180 Degrees    Right Shoulder ABduction  180 Degrees    Left Shoulder Flexion  180 Degrees    Left Shoulder ABduction  180 Degrees      Strength   Overall Strength  Within functional limits for tasks performed        LYMPHEDEMA/ONCOLOGY QUESTIONNAIRE - 10/27/18 1840      Type   Cancer Type  Right breast cancer      Surgeries   Lumpectomy Date  10/02/18    Sentinel Lymph Node Biopsy Date  10/02/18    Number Lymph Nodes Removed  3      Treatment   Past Chemotherapy Treatment  Yes    Active Radiation Treatment  --   to begin radiation soon      Right Upper Extremity Lymphedema   10 cm Proximal to Olecranon Process  25.8 cm    Olecranon Process  22.5 cm    15 cm Proximal to Ulnar Styloid Process  22.5 cm    Just Proximal to Ulnar Styloid Process  14.8 cm    Across Hand at [employer]  19 cm    At [place] of 2nd Digit  6 cm      Left Upper Extremity Lymphedema   10 cm Proximal to Olecranon Process  26 cm    Olecranon Process  22.8 cm    15 cm Proximal to Ulnar Styloid Process  23 cm    Just Proximal to Ulnar Styloid Process  14.8 cm    Across Hand at [employer]  19 cm    At [place] of 2nd Digit  6 cm          Quick Dash - 10/27/18 0001    Open a tight or new jar  No difficulty    Do heavy household chores (wash walls, wash floors)  No difficulty    Carry a shopping bag or briefcase  No difficulty    Wash your back  No difficulty    Use a knife to cut food  No difficulty    Recreational activities in which you take some force or impact through your arm, shoulder, or hand (golf, hammering, tennis)  No difficulty    During the past week, to what extent  has your arm, shoulder or hand problem interfered with your normal social activities with family, friends,  neighbors, or groups?  Not at all    During the past week, to what extent has your arm, shoulder or hand problem limited your work or other regular daily activities  Not at all    Arm, shoulder, or hand pain.  None    Tingling (pins and needles) in your arm, shoulder, or hand  None    Difficulty Sleeping  No difficulty    DASH Score  0 %        Objective measurements completed on examination: See above findings.      [facility] Adult PT Treatment/Exercise - 10/27/18 0001      Self-Care   Self-Care  Other Self-Care Comments    Other Self-Care Comments   instructed in lymphedema risk reduction precautions Pt given infomration sheet for [facility] for a compression sleeve and light compression bra that she may get insurance coverage for                   Breast Clinic Goals - 10/27/18 1846      Patient will be able to verbalize understanding of pertinent lymphedema risk reduction practices relevant to her diagnosis specifically related to skin care.   Time  1    Period  Days    Status  Achieved      Patient will be able to return demonstrate and/or verbalize understanding of the post-op home exercise program related to regaining shoulder range of motion.   Time  1    Period  Days    Status  Achieved            Plan - 10/27/18 1842    Clinical Impression Statement  Pt comes to PT as she prepares for radiation. She had some fullness in her right axilla after surgery, but this has resolved.  She has full ROM and strength and not increase in circumference of each arm She has a wonderful attitude about her treatment and progress. She was given infomration about lymphedema risk reduction and a prophylactic sleeve and also how to progress her return to exercise after her radiation. She was offerred to return to PT after radiation should she need it, but I feel she will do well on her own.    Personal Factors and Comorbidities  Comorbidity 1    Comorbidities  previous  chemotherapy    Stability/Clinical Decision Making  Stable/Uncomplicated    Clinical Decision Making  Low    Rehab Potential  Excellent    PT Frequency  One time visit    PT Treatment/Interventions  ADLs/Self Care Home Management;Patient/family education    PT Next Visit Plan  no follow up planned    Consulted and Agree with Plan of Care  Patient       Patient will benefit from skilled therapeutic intervention in order to improve the following deficits and impairments:  Decreased knowledge of precautions, Postural dysfunction  Visit Diagnosis: 1. Aftercare following surgery for neoplasm   2. Abnormal posture        Problem List Patient Active Problem List   Diagnosis Date Noted  . Port-A-Cath in place 07/01/2018  . Genetic testing 06/24/2018  . Family history of prostate cancer   . Malignant neoplasm of upper-outer quadrant of right breast in female, estrogen receptor negative ([place]) 06/02/2018  . Recurrent oral ulcers 06/25/2017  . Insomnia 06/03/2014  . Migraine, unspecified, without mention of  intractable migraine without mention of status migrainosus 12/25/2012  . 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 Hypertension 12/22/2012  . S/P abdominal hysterectomy 04/17/2011   Donato Heinz. Owens Shark PT  Norwood Levo 10/27/2018, 6:49 PM  [place], [place], [address] Phone: [phone]   Fax:  [phone]  Name: Katrina Liu MRN: [id number] Date of Birth: [birth year]

## 2018-10-27 NOTE — H&P (Signed)
Katrina Liu Documented: 10/27/2018 10:58 AM Location: [facility] Patient #: [id number] DOB: [birth year] Single / Language: Katrina Liu / Race: Black or African American Female  History of Present Illness Katrina Moores A. Sivan Cuello MD; 10/27/2018 11:17 AM) Patient words: Pt returns after lumpectomy / SLN mapping for stage 2 right breast cancer s/p neoadjuvant chemotherapy She has had a complete response she is doing well       Diagnosis 1. Breast, lumpectomy, Right - BENIGN BREAST PARENCHYMA. - TREATMENT-RELATED CHANGES. - THERE IS NO EVIDENCE OF MALIGNANCY. - SEE ONCOLOGY TABLE BELOW. 2. Lymph node, sentinel, biopsy, Right - THERE IS NO EVIDENCE OF CARCINOMA IN 1 OF 1 LYMPH NODE (0/1). 3. Lymph node, sentinel, biopsy, Right - THERE IS NO EVIDENCE OF CARCINOMA IN 1 OF 1 LYMPH NODE (0/1). 4. Lymph node, sentinel, biopsy, Right - THERE IS NO EVIDENCE OF CARCINOMA IN 1 OF 1 LYMPH NODE (0/1). Microscopic Comment 1. BREAST, STATUS POST NEOADJUVANT TREATMENT Procedure: Seed localized lumpectomy. Laterality: Right. Tumor Size: N/A. Histologic Type: N/A. Grade: N/A. Ductal Carcinoma in Situ (DCIS): Not identified. Regional Lymph Nodes: Number of Lymph Nodes Examined: 3. Number of Sentinel Lymph Nodes Examined: 3. Lymph Nodes with Macrometastases: 0. Lymph Nodes with Micrometastases: 0. Lymph Nodes with Isolated Tumor Cells: 0. Margins: Negative for carcinoma. Extent of Tumor: N/A. Treatment Effect in the Breast: Probable or definite response to presurgical therapy in the invasive carcinoma. Treatment Effect in the Lymph Nodes: No lymph node metastases and no prominent fibrous scarring in the nodes. Breast Prognostic Profile (pre-neoadjuvant case #: [id number]) Estrogen Receptor: 0%. 1 of 3 FINAL for Katrina Liu, Katrina Liu ([id number]) Microscopic Comment(continued) Progesterone Receptor: 0%. Her2: No amplification was detected. Ki-67: 20%. Residual Cancer Burden (RCB): This is  considered a complete pathologic response. Pathologic Stage Classification (p TNM, AJCC 8th Edition): Primary Tumor (ypT): ypT0. Regional Lymph Nodes (ypN): ypN0. Enid Cutter MD Pathologist, Electronic Signature (Case signed 10/03/2018) Specimen Gross and Clinical Information Specimen(s) Obtained: 1. Breast, lumpectomy, Right 2. Lymph node, sentinel, biopsy, Right 3. Lymph node, sentinel, biopsy, Right 4. Lymph node, sentinel, biopsy, Right Specimen Clinical.  The patient is a 58 year old female.   Problem List/Past Medical Sharyn Lull R. Brooks, CMA; 10/27/2018 10:58 AM) MALIGNANT NEOPLASM OF RIGHT BREAST, STAGE 1, ESTROGEN RECEPTOR NEGATIVE (C50.911)  Past Surgical History Sharyn Lull R. Brooks, CMA; 10/27/2018 10:58 AM) Breast Biopsy Right. Hysterectomy (not due to cancer) - Partial Oral Surgery  Diagnostic Studies History Sharyn Lull R. Brooks, CMA; 10/27/2018 10:58 AM) Colonoscopy 5-10 years ago Mammogram within last year Pap Smear 1-5 years ago  Allergies Sharyn Lull R. Brooks, CMA; 10/27/2018 10:58 AM) Lisinopril *ANTIHYPERTENSIVES* Swelling.  Medication History Sharyn Lull R. Brooks, CMA; 10/27/2018 10:59 AM) Ascorbic Acid (500MG Capsule, Oral) Active. Aspirin (81MG Tablet DR, Oral) Active. Biotin (1000MCG Tablet, Oral) Active. Hygroton (25MG Tablet, Oral) Active. Flax Seed Oil (1000MG Capsule, Oral) Active. Lortab (10-300MG/15ML Elixir, Oral) Active. Labetalol HCl (100MG Tablet, Oral) Active. Turmeric (500MG Capsule, Oral) Active. Zinc Gluconate (50MG Tablet, Oral) Active. Oral Analgesic Max St (20% Gel, Mouth/Throat) Active. Medications Reconciled  Social History Sharyn Lull R. Brooks, CMA; 10/27/2018 10:58 AM) No alcohol use No caffeine use No drug use Tobacco use Never smoker.  Family History Sharyn Lull R. Brooks, CMA; 10/27/2018 10:58 AM) Alcohol Abuse Father. Cancer Father. Cerebrovascular Accident Father. Depression Sister. Diabetes  Mellitus Family Members In General. Hypertension Brother, Father, Sister. Respiratory Condition Father.  Pregnancy / Birth History Sharyn Lull R. Rolena Infante, CMA; 10/27/2018 10:58 AM) Age at menarche 89 years. Age of menopause  51-55 Gravida 3 Irregular periods Maternal age 49-30 Para 1  Other Problems Sharyn Lull R. Brooks, CMA; 10/27/2018 10:58 AM) Back Pain Cerebrovascular Accident Gastroesophageal Reflux Disease Hemorrhoids High blood pressure BREAST NEOPLASM, TIS (DCIS), RIGHT (D05.11)    Vitals (Michelle R. Brooks CMA; 10/27/2018 10:58 AM) 10/27/2018 10:58 AM Weight: 142.13 lb Height: 65.5in Body Surface Area: 1.72 m Body Mass Index: 23.29 kg/m  Pulse: 90 (Regular)  BP: 130/76 (Sitting, Left Arm, Standard)        Physical Exam (Katrina Malenfant A. Fadumo Heng MD; 10/27/2018 11:13 AM)  Chest and Lung Exam Note: LEFT PORT IN PLACE  Breast Note: RIGHT AXILLARY INCISION cdi  Lymphatic General Lymphatics -Note:NO LE.     Assessment & Plan (Katrina Menn A. Rosea Dory MD; 10/27/2018 11:16 AM)  PORT-A-CATH IN PLACE (A19.379) Impression: SCHEDULE REMOVAL SINCE NO FURTHER NEED  Current Plans I recommended surgery to remove the catheter. I explained the technique of removal with use of local anesthesia & possible need for more aggressive sedation/anesthesia for patient comfort.  Risks such as bleeding, infection, and other risks were discussed. Post-operative dressing/incision care was discussed. I noted a good likelihood this will help address the problem. We will work to minimize complications. Questions were answered. The patient expresses understanding & wishes to proceed with surgery.  Pt Education - CCS Free Text Education/Instructions: discussed with patient and provided information.  MALIGNANT NEOPLASM OF RIGHT BREAST, STAGE 1, ESTROGEN RECEPTOR NEGATIVE (C50.911)

## 2018-10-27 NOTE — Patient Instructions (Signed)
First of all, check with your insurance company to see if provider is in network    [facility] (for wigs and compression sleeves / gloves/gauntlets )  [address] [place], [place] [address] [phone]  Will file some insurances --- call for appointment   [facility] (for mastectomy prosthetics and garments) [address] [place], [place] [address] [phone] Will file some insurances --- call for appointment  [facility]  [address]  [place], [place] [address] [phone] Lower extremity garments  [facility] [address] [place], [place]  [address] [phone]  Cathy Rubel ( Medicaid certified lymphedema fitter) [phone] [email or web address]  Melissa Meares  [facility]  [phone]  [facility] [address] [place],  [address] [phone]  Other Resources: National Lymphedema Network:  www.lymphnet.org www.Klosetraining.com for patient articles and self manual lymph drainage information www.lymphedemablog.com has informative articles.  www.compressionguru.com www.lymphedemaproducts.com www.brightlifedirect.com www.compressionguru.com 

## 2018-10-28 ENCOUNTER — Ambulatory Visit: Payer: Managed Care, Other (non HMO) | Admitting: Radiation Oncology

## 2018-10-29 ENCOUNTER — Ambulatory Visit: Payer: Managed Care, Other (non HMO)

## 2018-10-30 ENCOUNTER — Ambulatory Visit: Payer: Managed Care, Other (non HMO)

## 2018-10-30 ENCOUNTER — Encounter: Payer: Self-pay | Admitting: Radiation Oncology

## 2018-11-03 ENCOUNTER — Ambulatory Visit
Admission: RE | Admit: 2018-11-03 | Discharge: 2018-11-03 | Disposition: A | Discharge status: Home / Self Care | Payer: Managed Care, Other (non HMO) | Source: Ambulatory Visit | Attending: Radiation Oncology | Admitting: Radiation Oncology

## 2018-11-03 ENCOUNTER — Encounter: Payer: Self-pay | Admitting: *Deleted

## 2018-11-03 DIAGNOSIS — Z171 Estrogen receptor negative status [ER-]: Secondary | ICD-10-CM | POA: Insufficient documentation

## 2018-11-03 DIAGNOSIS — C50411 Malignant neoplasm of upper-outer quadrant of right female breast: Secondary | ICD-10-CM | POA: Insufficient documentation

## 2018-11-03 DIAGNOSIS — Z452 Encounter for adjustment and management of vascular access device: Secondary | ICD-10-CM | POA: Diagnosis not present

## 2018-11-03 DIAGNOSIS — Z510 Encounter for antineoplastic radiation therapy: Secondary | ICD-10-CM | POA: Insufficient documentation

## 2018-11-04 ENCOUNTER — Ambulatory Visit: Payer: Managed Care, Other (non HMO)

## 2018-11-05 ENCOUNTER — Ambulatory Visit: Payer: Managed Care, Other (non HMO)

## 2018-11-06 ENCOUNTER — Ambulatory Visit
Admission: RE | Admit: 2018-11-06 | Discharge: 2018-11-06 | Disposition: A | Discharge status: Home / Self Care | Payer: Managed Care, Other (non HMO) | Source: Ambulatory Visit | Attending: Radiation Oncology | Admitting: Radiation Oncology

## 2018-11-06 DIAGNOSIS — Z171 Estrogen receptor negative status [ER-]: Secondary | ICD-10-CM

## 2018-11-06 DIAGNOSIS — C50411 Malignant neoplasm of upper-outer quadrant of right female breast: Secondary | ICD-10-CM

## 2018-11-06 DIAGNOSIS — Z510 Encounter for antineoplastic radiation therapy: Secondary | ICD-10-CM | POA: Diagnosis not present

## 2018-11-06 MED ORDER — ALRA NON-METALLIC DEODORANT (RAD-ONC): 1.0000 "application " | Freq: Once | TOPICAL | Status: DC

## 2018-11-06 MED ORDER — RADIAPLEXRX EX GEL: Freq: Once | CUTANEOUS | Status: DC

## 2018-11-07 ENCOUNTER — Other Ambulatory Visit: Payer: Self-pay

## 2018-11-07 ENCOUNTER — Ambulatory Visit
Admission: RE | Admit: 2018-11-07 | Discharge: 2018-11-07 | Disposition: A | Discharge status: Home / Self Care | Payer: Managed Care, Other (non HMO) | Source: Ambulatory Visit | Attending: Radiation Oncology | Admitting: Radiation Oncology

## 2018-11-07 ENCOUNTER — Telehealth: Payer: Self-pay

## 2018-11-07 DIAGNOSIS — Z510 Encounter for antineoplastic radiation therapy: Secondary | ICD-10-CM | POA: Diagnosis not present

## 2018-11-07 NOTE — Telephone Encounter (Signed)
Pt has not had potassium rechecked since June.  Unsure if she still requires this prescription. PA Lucianne Lei is not here today to refill this prescription that was requested on 10/17/2018.

## 2018-11-07 NOTE — Telephone Encounter (Signed)
RN informed patient that prescription for Potassium was no longer indicated per Dr. Lindi Adie.  Pt voiced understanding and agreement. No further needs.

## 2018-11-07 NOTE — Telephone Encounter (Signed)
Ok thanks 

## 2018-11-10 ENCOUNTER — Other Ambulatory Visit: Payer: Self-pay

## 2018-11-10 ENCOUNTER — Ambulatory Visit
Admission: RE | Admit: 2018-11-10 | Discharge: 2018-11-10 | Disposition: A | Discharge status: Home / Self Care | Payer: Managed Care, Other (non HMO) | Source: Ambulatory Visit | Attending: Radiation Oncology | Admitting: Radiation Oncology

## 2018-11-10 DIAGNOSIS — Z510 Encounter for antineoplastic radiation therapy: Secondary | ICD-10-CM | POA: Diagnosis not present

## 2018-11-11 ENCOUNTER — Ambulatory Visit
Admission: RE | Admit: 2018-11-11 | Discharge: 2018-11-11 | Disposition: A | Discharge status: Home / Self Care | Payer: Managed Care, Other (non HMO) | Source: Ambulatory Visit | Attending: Radiation Oncology | Admitting: Radiation Oncology

## 2018-11-11 ENCOUNTER — Other Ambulatory Visit: Payer: Self-pay

## 2018-11-11 DIAGNOSIS — Z510 Encounter for antineoplastic radiation therapy: Secondary | ICD-10-CM | POA: Diagnosis not present

## 2018-11-12 ENCOUNTER — Ambulatory Visit
Admission: RE | Admit: 2018-11-12 | Discharge: 2018-11-12 | Disposition: A | Discharge status: Home / Self Care | Payer: Managed Care, Other (non HMO) | Source: Ambulatory Visit | Attending: Radiation Oncology | Admitting: Radiation Oncology

## 2018-11-12 ENCOUNTER — Other Ambulatory Visit: Payer: Self-pay

## 2018-11-12 DIAGNOSIS — Z510 Encounter for antineoplastic radiation therapy: Secondary | ICD-10-CM | POA: Diagnosis not present

## 2018-11-13 ENCOUNTER — Other Ambulatory Visit: Payer: Self-pay

## 2018-11-13 ENCOUNTER — Ambulatory Visit
Admission: RE | Admit: 2018-11-13 | Discharge: 2018-11-13 | Disposition: A | Discharge status: Home / Self Care | Payer: Managed Care, Other (non HMO) | Source: Ambulatory Visit | Attending: Radiation Oncology | Admitting: Radiation Oncology

## 2018-11-13 DIAGNOSIS — Z510 Encounter for antineoplastic radiation therapy: Secondary | ICD-10-CM | POA: Diagnosis not present

## 2018-11-14 ENCOUNTER — Other Ambulatory Visit: Payer: Self-pay

## 2018-11-14 ENCOUNTER — Ambulatory Visit
Admission: RE | Admit: 2018-11-14 | Discharge: 2018-11-14 | Disposition: A | Discharge status: Home / Self Care | Payer: Managed Care, Other (non HMO) | Source: Ambulatory Visit | Attending: Radiation Oncology | Admitting: Radiation Oncology

## 2018-11-14 DIAGNOSIS — Z510 Encounter for antineoplastic radiation therapy: Secondary | ICD-10-CM | POA: Diagnosis not present

## 2018-11-17 ENCOUNTER — Other Ambulatory Visit: Payer: Self-pay

## 2018-11-17 ENCOUNTER — Ambulatory Visit
Admission: RE | Admit: 2018-11-17 | Discharge: 2018-11-17 | Disposition: A | Discharge status: Home / Self Care | Payer: Managed Care, Other (non HMO) | Source: Ambulatory Visit | Attending: Radiation Oncology | Admitting: Radiation Oncology

## 2018-11-17 DIAGNOSIS — Z510 Encounter for antineoplastic radiation therapy: Secondary | ICD-10-CM | POA: Diagnosis not present

## 2018-11-18 ENCOUNTER — Other Ambulatory Visit: Payer: Self-pay

## 2018-11-18 ENCOUNTER — Ambulatory Visit
Admission: RE | Admit: 2018-11-18 | Discharge: 2018-11-18 | Disposition: A | Discharge status: Home / Self Care | Payer: Managed Care, Other (non HMO) | Source: Ambulatory Visit | Attending: Radiation Oncology | Admitting: Radiation Oncology

## 2018-11-18 DIAGNOSIS — Z510 Encounter for antineoplastic radiation therapy: Secondary | ICD-10-CM | POA: Diagnosis not present

## 2018-11-19 ENCOUNTER — Ambulatory Visit
Admission: RE | Admit: 2018-11-19 | Discharge: 2018-11-19 | Disposition: A | Discharge status: Home / Self Care | Payer: Managed Care, Other (non HMO) | Source: Ambulatory Visit | Attending: Radiation Oncology | Admitting: Radiation Oncology

## 2018-11-19 DIAGNOSIS — Z510 Encounter for antineoplastic radiation therapy: Secondary | ICD-10-CM | POA: Diagnosis not present

## 2018-11-20 ENCOUNTER — Ambulatory Visit
Admission: RE | Admit: 2018-11-20 | Discharge: 2018-11-20 | Disposition: A | Discharge status: Home / Self Care | Payer: Managed Care, Other (non HMO) | Source: Ambulatory Visit | Attending: Radiation Oncology | Admitting: Radiation Oncology

## 2018-11-20 ENCOUNTER — Other Ambulatory Visit: Payer: Self-pay

## 2018-11-20 DIAGNOSIS — Z510 Encounter for antineoplastic radiation therapy: Secondary | ICD-10-CM | POA: Diagnosis not present

## 2018-11-21 ENCOUNTER — Other Ambulatory Visit: Payer: Self-pay

## 2018-11-21 ENCOUNTER — Ambulatory Visit
Admission: RE | Admit: 2018-11-21 | Discharge: 2018-11-21 | Disposition: A | Discharge status: Home / Self Care | Payer: Managed Care, Other (non HMO) | Source: Ambulatory Visit | Attending: Radiation Oncology | Admitting: Radiation Oncology

## 2018-11-21 DIAGNOSIS — Z510 Encounter for antineoplastic radiation therapy: Secondary | ICD-10-CM | POA: Diagnosis not present

## 2018-11-24 ENCOUNTER — Other Ambulatory Visit: Payer: Self-pay

## 2018-11-24 ENCOUNTER — Ambulatory Visit
Admission: RE | Admit: 2018-11-24 | Discharge: 2018-11-24 | Disposition: A | Discharge status: Home / Self Care | Payer: Managed Care, Other (non HMO) | Source: Ambulatory Visit | Attending: Radiation Oncology | Admitting: Radiation Oncology

## 2018-11-24 DIAGNOSIS — Z510 Encounter for antineoplastic radiation therapy: Secondary | ICD-10-CM | POA: Diagnosis not present

## 2018-11-25 ENCOUNTER — Ambulatory Visit
Admission: RE | Admit: 2018-11-25 | Discharge: 2018-11-25 | Disposition: A | Discharge status: Home / Self Care | Payer: Managed Care, Other (non HMO) | Source: Ambulatory Visit | Attending: Radiation Oncology | Admitting: Radiation Oncology

## 2018-11-25 ENCOUNTER — Encounter: Payer: Self-pay | Admitting: Hematology and Oncology

## 2018-11-25 ENCOUNTER — Other Ambulatory Visit: Payer: Self-pay

## 2018-11-25 DIAGNOSIS — C50411 Malignant neoplasm of upper-outer quadrant of right female breast: Secondary | ICD-10-CM | POA: Diagnosis not present

## 2018-11-25 DIAGNOSIS — Z510 Encounter for antineoplastic radiation therapy: Secondary | ICD-10-CM | POA: Diagnosis not present

## 2018-11-26 ENCOUNTER — Ambulatory Visit
Admission: RE | Admit: 2018-11-26 | Discharge: 2018-11-26 | Disposition: A | Discharge status: Home / Self Care | Payer: Managed Care, Other (non HMO) | Source: Ambulatory Visit | Attending: Radiation Oncology | Admitting: Radiation Oncology

## 2018-11-26 ENCOUNTER — Other Ambulatory Visit: Payer: Self-pay

## 2018-11-26 ENCOUNTER — Inpatient Hospital Stay: Payer: Managed Care, Other (non HMO) | Attending: Hematology and Oncology

## 2018-11-26 DIAGNOSIS — C50411 Malignant neoplasm of upper-outer quadrant of right female breast: Secondary | ICD-10-CM | POA: Insufficient documentation

## 2018-11-26 DIAGNOSIS — Z510 Encounter for antineoplastic radiation therapy: Secondary | ICD-10-CM | POA: Diagnosis not present

## 2018-11-26 DIAGNOSIS — Z452 Encounter for adjustment and management of vascular access device: Secondary | ICD-10-CM | POA: Insufficient documentation

## 2018-11-26 DIAGNOSIS — Z171 Estrogen receptor negative status [ER-]: Secondary | ICD-10-CM | POA: Insufficient documentation

## 2018-11-26 DIAGNOSIS — Z95828 Presence of other vascular implants and grafts: Secondary | ICD-10-CM

## 2018-11-26 MED ORDER — HEPARIN SOD (PORK) LOCK FLUSH 100 UNIT/ML IV SOLN
500.0000 [IU] | Freq: Once | INTRAVENOUS | Status: AC
  Administered 2018-11-26: 09:00:00 500 [IU] via INTRAVENOUS
  Filled 2018-11-26: qty 5

## 2018-11-26 MED ORDER — SODIUM CHLORIDE 0.9% FLUSH
10.0000 mL | Freq: Once | INTRAVENOUS | Status: AC
  Administered 2018-11-26: 10 mL via INTRAVENOUS
  Filled 2018-11-26: qty 10

## 2018-11-27 ENCOUNTER — Ambulatory Visit
Admission: RE | Admit: 2018-11-27 | Discharge: 2018-11-27 | Disposition: A | Discharge status: Home / Self Care | Payer: Managed Care, Other (non HMO) | Source: Ambulatory Visit | Attending: Radiation Oncology | Admitting: Radiation Oncology

## 2018-11-27 ENCOUNTER — Other Ambulatory Visit: Payer: Self-pay

## 2018-11-27 DIAGNOSIS — Z510 Encounter for antineoplastic radiation therapy: Secondary | ICD-10-CM | POA: Diagnosis not present

## 2018-11-28 ENCOUNTER — Other Ambulatory Visit: Payer: Self-pay

## 2018-11-28 ENCOUNTER — Ambulatory Visit
Admission: RE | Admit: 2018-11-28 | Discharge: 2018-11-28 | Disposition: A | Discharge status: Home / Self Care | Payer: Managed Care, Other (non HMO) | Source: Ambulatory Visit | Attending: Radiation Oncology | Admitting: Radiation Oncology

## 2018-11-28 DIAGNOSIS — Z510 Encounter for antineoplastic radiation therapy: Secondary | ICD-10-CM | POA: Diagnosis not present

## 2018-12-01 ENCOUNTER — Other Ambulatory Visit: Payer: Self-pay

## 2018-12-01 ENCOUNTER — Ambulatory Visit
Admission: RE | Admit: 2018-12-01 | Discharge: 2018-12-01 | Disposition: A | Discharge status: Home / Self Care | Payer: Managed Care, Other (non HMO) | Source: Ambulatory Visit | Attending: Radiation Oncology | Admitting: Radiation Oncology

## 2018-12-01 DIAGNOSIS — Z510 Encounter for antineoplastic radiation therapy: Secondary | ICD-10-CM | POA: Insufficient documentation

## 2018-12-01 DIAGNOSIS — Z171 Estrogen receptor negative status [ER-]: Secondary | ICD-10-CM | POA: Diagnosis not present

## 2018-12-01 DIAGNOSIS — C50411 Malignant neoplasm of upper-outer quadrant of right female breast: Secondary | ICD-10-CM | POA: Diagnosis not present

## 2018-12-02 ENCOUNTER — Other Ambulatory Visit: Payer: Self-pay

## 2018-12-02 ENCOUNTER — Ambulatory Visit
Admission: RE | Admit: 2018-12-02 | Discharge: 2018-12-02 | Disposition: A | Discharge status: Home / Self Care | Payer: Managed Care, Other (non HMO) | Source: Ambulatory Visit | Attending: Radiation Oncology | Admitting: Radiation Oncology

## 2018-12-02 DIAGNOSIS — C50411 Malignant neoplasm of upper-outer quadrant of right female breast: Secondary | ICD-10-CM | POA: Diagnosis not present

## 2018-12-03 ENCOUNTER — Other Ambulatory Visit: Payer: Self-pay

## 2018-12-03 ENCOUNTER — Ambulatory Visit
Admission: RE | Admit: 2018-12-03 | Discharge: 2018-12-03 | Disposition: A | Discharge status: Home / Self Care | Payer: Managed Care, Other (non HMO) | Source: Ambulatory Visit | Attending: Radiation Oncology | Admitting: Radiation Oncology

## 2018-12-03 DIAGNOSIS — C50411 Malignant neoplasm of upper-outer quadrant of right female breast: Secondary | ICD-10-CM | POA: Diagnosis not present

## 2018-12-04 ENCOUNTER — Ambulatory Visit (INDEPENDENT_AMBULATORY_CARE_PROVIDER_SITE_OTHER): Payer: Managed Care, Other (non HMO) | Admitting: Otolaryngology

## 2018-12-04 ENCOUNTER — Other Ambulatory Visit: Payer: Self-pay

## 2018-12-04 ENCOUNTER — Ambulatory Visit
Admission: RE | Admit: 2018-12-04 | Discharge: 2018-12-04 | Disposition: A | Discharge status: Home / Self Care | Payer: Managed Care, Other (non HMO) | Source: Ambulatory Visit | Attending: Radiation Oncology | Admitting: Radiation Oncology

## 2018-12-04 DIAGNOSIS — C50411 Malignant neoplasm of upper-outer quadrant of right female breast: Secondary | ICD-10-CM | POA: Diagnosis not present

## 2018-12-05 ENCOUNTER — Ambulatory Visit
Admission: RE | Admit: 2018-12-05 | Discharge: 2018-12-05 | Disposition: A | Discharge status: Home / Self Care | Payer: Managed Care, Other (non HMO) | Source: Ambulatory Visit | Attending: Radiation Oncology | Admitting: Radiation Oncology

## 2018-12-05 ENCOUNTER — Other Ambulatory Visit: Payer: Self-pay

## 2018-12-05 DIAGNOSIS — C50411 Malignant neoplasm of upper-outer quadrant of right female breast: Secondary | ICD-10-CM | POA: Diagnosis not present

## 2018-12-05 DIAGNOSIS — Z171 Estrogen receptor negative status [ER-]: Secondary | ICD-10-CM

## 2018-12-05 MED ORDER — SONAFINE EX EMUL
1.0000 "application " | Freq: Once | CUTANEOUS | Status: AC
  Administered 2018-12-05: 1 via TOPICAL

## 2018-12-08 ENCOUNTER — Other Ambulatory Visit: Payer: Self-pay

## 2018-12-08 ENCOUNTER — Ambulatory Visit: Payer: Managed Care, Other (non HMO)

## 2018-12-08 ENCOUNTER — Ambulatory Visit
Admission: RE | Admit: 2018-12-08 | Discharge: 2018-12-08 | Disposition: A | Discharge status: Home / Self Care | Payer: Managed Care, Other (non HMO) | Source: Ambulatory Visit | Attending: Radiation Oncology | Admitting: Radiation Oncology

## 2018-12-08 DIAGNOSIS — C50411 Malignant neoplasm of upper-outer quadrant of right female breast: Secondary | ICD-10-CM | POA: Diagnosis not present

## 2018-12-09 ENCOUNTER — Ambulatory Visit: Payer: Managed Care, Other (non HMO)

## 2018-12-09 ENCOUNTER — Other Ambulatory Visit: Payer: Self-pay

## 2018-12-09 ENCOUNTER — Ambulatory Visit
Admission: RE | Admit: 2018-12-09 | Discharge: 2018-12-09 | Disposition: A | Discharge status: Home / Self Care | Payer: Managed Care, Other (non HMO) | Source: Ambulatory Visit | Attending: Radiation Oncology | Admitting: Radiation Oncology

## 2018-12-09 DIAGNOSIS — C50411 Malignant neoplasm of upper-outer quadrant of right female breast: Secondary | ICD-10-CM | POA: Diagnosis not present

## 2018-12-10 ENCOUNTER — Ambulatory Visit: Payer: Managed Care, Other (non HMO)

## 2018-12-10 ENCOUNTER — Ambulatory Visit
Admission: RE | Admit: 2018-12-10 | Discharge: 2018-12-10 | Disposition: A | Discharge status: Home / Self Care | Payer: Managed Care, Other (non HMO) | Source: Ambulatory Visit | Attending: Radiation Oncology | Admitting: Radiation Oncology

## 2018-12-10 ENCOUNTER — Other Ambulatory Visit: Payer: Self-pay

## 2018-12-10 DIAGNOSIS — C50411 Malignant neoplasm of upper-outer quadrant of right female breast: Secondary | ICD-10-CM | POA: Diagnosis not present

## 2018-12-11 ENCOUNTER — Ambulatory Visit: Payer: Managed Care, Other (non HMO)

## 2018-12-11 ENCOUNTER — Other Ambulatory Visit: Payer: Self-pay

## 2018-12-11 DIAGNOSIS — C50411 Malignant neoplasm of upper-outer quadrant of right female breast: Secondary | ICD-10-CM | POA: Diagnosis not present

## 2018-12-11 NOTE — Progress Notes (Signed)
  Radiation Oncology         [phone] ________________________________  Name: Katrina Liu MRN: [id number]  Date: 10/21/2018  DOB: 19-Aug-1960  Optical Surface Tracking Plan:  Since intensity modulated radiotherapy (IMRT) and 3D conformal radiation treatment methods are predicated on accurate and precise positioning for treatment, intrafraction motion monitoring is medically necessary to ensure accurate and safe treatment delivery.  The ability to quantify intrafraction motion without excessive ionizing radiation dose can only be performed with optical surface tracking. Accordingly, surface imaging offers the opportunity to obtain 3D measurements of patient position throughout IMRT and 3D treatments without excessive radiation exposure.  I am ordering optical surface tracking for this patient's upcoming course of radiotherapy. ________________________________  Kyung Rudd, MD 12/11/2018 6:31 PM    Reference:   Ursula Alert, J, et al. Surface imaging-based analysis of intrafraction motion for breast radiotherapy patients.Journal of [place], n. 6, nov. 2014. ISSN [id number].   Available at: <http://www.jacmp.org/index.php/jacmp/article/view/4957>.

## 2018-12-11 NOTE — Progress Notes (Signed)
  Radiation Oncology         [phone]) [phone] ________________________________  Name: STEPFANIE YOTT MRN: [id number]  Date: 10/21/2018  DOB: [birth year]  DIAGNOSIS:     ICD-10-CM   1. Malignant neoplasm of upper-outer quadrant of right breast in female, estrogen receptor negative ([place])  C50.411    Z17.1      SIMULATION AND TREATMENT PLANNING NOTE  The patient presented for simulation prior to beginning her course of radiation treatment for her diagnosis of right-sided breast cancer. The patient was placed in a supine position on a breast board. A customized vac-lock bag was constructed and this complex treatment device will be used on a daily basis during her treatment. In this fashion, a CT scan was obtained through the chest area and an isocenter was placed near the chest wall within the breast.  The patient will be planned to receive a course of radiation initially to a dose of 50.4 Gy. This will consist of a whole breast radiotherapy technique. To accomplish this, 2 customized blocks have been designed which will correspond to medial and lateral whole breast tangent fields. This treatment will be accomplished at 1.8 Gy per fraction. A forward planning technique will also be evaluated to determine if this approach improves the plan. It is anticipated that the patient will then receive a 10 Gy boost to the seroma cavity which has been contoured. This will be accomplished at 2 Gy per fraction.   This initial treatment will consist of a 3-D conformal technique. The seroma has been contoured as the primary target structure. Additionally, dose volume histograms of both this target as well as the lungs and heart will also be evaluated. Such an approach is necessary to ensure that the target area is adequately covered while the nearby critical normal structures are adequately spared.  Plan:  The final anticipated total dose therefore will correspond to 60.4 Gy.    _______________________________    Jodelle Gross, MD, PhD

## 2018-12-12 ENCOUNTER — Ambulatory Visit: Payer: Managed Care, Other (non HMO)

## 2018-12-12 ENCOUNTER — Ambulatory Visit
Admission: RE | Admit: 2018-12-12 | Discharge: 2018-12-12 | Disposition: A | Discharge status: Home / Self Care | Payer: Managed Care, Other (non HMO) | Source: Ambulatory Visit | Attending: Radiation Oncology | Admitting: Radiation Oncology

## 2018-12-12 ENCOUNTER — Other Ambulatory Visit: Payer: Self-pay

## 2018-12-12 DIAGNOSIS — C50411 Malignant neoplasm of upper-outer quadrant of right female breast: Secondary | ICD-10-CM | POA: Diagnosis not present

## 2018-12-15 ENCOUNTER — Ambulatory Visit
Admission: RE | Admit: 2018-12-15 | Discharge: 2018-12-15 | Disposition: A | Discharge status: Home / Self Care | Payer: Managed Care, Other (non HMO) | Source: Ambulatory Visit | Attending: Radiation Oncology | Admitting: Radiation Oncology

## 2018-12-15 ENCOUNTER — Ambulatory Visit: Payer: Managed Care, Other (non HMO)

## 2018-12-15 ENCOUNTER — Other Ambulatory Visit: Payer: Self-pay

## 2018-12-15 DIAGNOSIS — C50411 Malignant neoplasm of upper-outer quadrant of right female breast: Secondary | ICD-10-CM | POA: Diagnosis not present

## 2018-12-16 ENCOUNTER — Ambulatory Visit: Payer: Managed Care, Other (non HMO)

## 2018-12-16 ENCOUNTER — Ambulatory Visit
Admission: RE | Admit: 2018-12-16 | Discharge: 2018-12-16 | Disposition: A | Discharge status: Home / Self Care | Payer: Managed Care, Other (non HMO) | Source: Ambulatory Visit | Attending: Radiation Oncology | Admitting: Radiation Oncology

## 2018-12-16 ENCOUNTER — Other Ambulatory Visit: Payer: Self-pay

## 2018-12-16 DIAGNOSIS — C50411 Malignant neoplasm of upper-outer quadrant of right female breast: Secondary | ICD-10-CM | POA: Diagnosis not present

## 2018-12-17 ENCOUNTER — Ambulatory Visit
Admission: RE | Admit: 2018-12-17 | Discharge: 2018-12-17 | Disposition: A | Discharge status: Home / Self Care | Payer: Managed Care, Other (non HMO) | Source: Ambulatory Visit | Attending: Radiation Oncology | Admitting: Radiation Oncology

## 2018-12-17 ENCOUNTER — Other Ambulatory Visit: Payer: Self-pay

## 2018-12-17 ENCOUNTER — Ambulatory Visit: Payer: Managed Care, Other (non HMO)

## 2018-12-17 DIAGNOSIS — C50411 Malignant neoplasm of upper-outer quadrant of right female breast: Secondary | ICD-10-CM | POA: Diagnosis not present

## 2018-12-18 ENCOUNTER — Ambulatory Visit: Payer: Managed Care, Other (non HMO)

## 2018-12-18 ENCOUNTER — Ambulatory Visit
Admission: RE | Admit: 2018-12-18 | Discharge: 2018-12-18 | Disposition: A | Discharge status: Home / Self Care | Payer: Managed Care, Other (non HMO) | Source: Ambulatory Visit | Attending: Radiation Oncology | Admitting: Radiation Oncology

## 2018-12-18 ENCOUNTER — Other Ambulatory Visit: Payer: Self-pay

## 2018-12-18 DIAGNOSIS — C50411 Malignant neoplasm of upper-outer quadrant of right female breast: Secondary | ICD-10-CM | POA: Diagnosis not present

## 2018-12-19 ENCOUNTER — Ambulatory Visit: Payer: Managed Care, Other (non HMO)

## 2018-12-19 ENCOUNTER — Ambulatory Visit
Admission: RE | Admit: 2018-12-19 | Discharge: 2018-12-19 | Disposition: A | Discharge status: Home / Self Care | Payer: Managed Care, Other (non HMO) | Source: Ambulatory Visit | Attending: Radiation Oncology | Admitting: Radiation Oncology

## 2018-12-19 ENCOUNTER — Other Ambulatory Visit: Payer: Self-pay

## 2018-12-19 DIAGNOSIS — C50411 Malignant neoplasm of upper-outer quadrant of right female breast: Secondary | ICD-10-CM | POA: Diagnosis not present

## 2018-12-22 ENCOUNTER — Ambulatory Visit: Payer: Managed Care, Other (non HMO)

## 2018-12-22 ENCOUNTER — Ambulatory Visit
Admission: RE | Admit: 2018-12-22 | Discharge: 2018-12-22 | Disposition: A | Discharge status: Home / Self Care | Payer: Managed Care, Other (non HMO) | Source: Ambulatory Visit | Attending: Radiation Oncology | Admitting: Radiation Oncology

## 2018-12-22 ENCOUNTER — Encounter: Payer: Self-pay | Admitting: Radiation Oncology

## 2018-12-22 ENCOUNTER — Encounter: Payer: Self-pay | Admitting: *Deleted

## 2018-12-22 ENCOUNTER — Other Ambulatory Visit: Payer: Self-pay

## 2018-12-22 DIAGNOSIS — C50411 Malignant neoplasm of upper-outer quadrant of right female breast: Secondary | ICD-10-CM | POA: Diagnosis not present

## 2018-12-23 ENCOUNTER — Ambulatory Visit: Payer: Managed Care, Other (non HMO)

## 2018-12-30 ENCOUNTER — Encounter: Payer: Self-pay | Admitting: Hematology and Oncology

## 2019-01-01 ENCOUNTER — Ambulatory Visit (INDEPENDENT_AMBULATORY_CARE_PROVIDER_SITE_OTHER): Payer: Managed Care, Other (non HMO) | Admitting: Otolaryngology

## 2019-01-01 ENCOUNTER — Other Ambulatory Visit: Payer: Self-pay

## 2019-01-01 DIAGNOSIS — K120 Recurrent oral aphthae: Secondary | ICD-10-CM

## 2019-01-08 ENCOUNTER — Inpatient Hospital Stay: Payer: Managed Care, Other (non HMO) | Attending: Hematology and Oncology

## 2019-01-08 ENCOUNTER — Other Ambulatory Visit: Payer: Self-pay

## 2019-01-08 DIAGNOSIS — C50411 Malignant neoplasm of upper-outer quadrant of right female breast: Secondary | ICD-10-CM | POA: Diagnosis not present

## 2019-01-08 DIAGNOSIS — Z452 Encounter for adjustment and management of vascular access device: Secondary | ICD-10-CM | POA: Diagnosis present

## 2019-01-08 DIAGNOSIS — Z171 Estrogen receptor negative status [ER-]: Secondary | ICD-10-CM

## 2019-01-08 DIAGNOSIS — Z95828 Presence of other vascular implants and grafts: Secondary | ICD-10-CM

## 2019-01-08 MED ORDER — SODIUM CHLORIDE 0.9% FLUSH
10.0000 mL | Freq: Once | INTRAVENOUS | Status: AC
  Administered 2019-01-08: 10 mL
  Filled 2019-01-08: qty 10

## 2019-01-08 MED ORDER — HEPARIN SOD (PORK) LOCK FLUSH 100 UNIT/ML IV SOLN
500.0000 [IU] | Freq: Once | INTRAVENOUS | Status: AC
  Administered 2019-01-08: 11:00:00 500 [IU]
  Filled 2019-01-08: qty 5

## 2019-01-20 ENCOUNTER — Telehealth: Payer: Self-pay | Admitting: Radiation Oncology

## 2019-01-20 NOTE — Telephone Encounter (Signed)
  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  Interval Since Last Radiation:  4 weeks   11/06/2018-12/22/2018:  The right breast was treated to 50.4 GY in 28 fractions, followed by a 10 Gy boost in 5 fractions to the lumpectomy cavity.  Narrative:  The patient was contacted today for routine follow-up. During treatment she did very well with radiotherapy and did not have significant desquamation. She reports she is doing well and getting ready to have her PAC removed in a few weeks.  Impression/Plan: 1. 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 The patient has been doing well since completion of radiotherapy. We discussed that we would be happy to continue to follow her as needed, but she will also continue to follow up with Dr. Lindi Adie in medical oncology. She was counseled on skin care as well as measures to avoid sun exposure to this area.  2. Survivorship. We discussed the importance of survivorship evaluation and was given the phone number for Ottis Stain [phone]) to be added to the list to receive the monthly resource calendar for the cancer center.     Carola Rhine, PAC

## 2019-01-23 NOTE — Progress Notes (Signed)
  Radiation Oncology         [phone]) [phone] ________________________________  Name: Katrina Liu MRN: [id number]  Date: 12/22/2018  DOB: 02/01/1961  End of Treatment Note  Diagnosis:   right-sided breast cancer     Indication for treatment:  Curative       Radiation treatment dates:   11/06/18 - 12/22/18  Site/dose:   The patient initially received a dose of 50.4 Gy in 28 fractions to the breast using whole-breast tangent fields. This was delivered using a 3-D conformal technique. The patient then received a boost to the seroma. This delivered an additional 10 Gy in 5 fractions using a electron boost with 41meV electrons. The total dose was 60.4 Gy.  Narrative: The patient tolerated radiation treatment relatively well.   The patient had some expected skin irritation as she progressed during treatment. Moist desquamation was not present at the end of treatment.  Plan: The patient has completed radiation treatment. The patient will return to radiation oncology clinic for routine followup in one month. I advised the patient to call or return sooner if they have any questions or concerns related to their recovery or treatment. ________________________________  Jodelle Gross, M.D., Ph.D.

## 2019-01-30 ENCOUNTER — Telehealth: Payer: Self-pay | Admitting: Adult Health

## 2019-01-30 NOTE — Telephone Encounter (Signed)
I talk with patient regarding video visit °

## 2019-02-07 ENCOUNTER — Encounter: Payer: Self-pay | Admitting: Hematology and Oncology

## 2019-02-16 ENCOUNTER — Telehealth: Payer: Self-pay | Admitting: Adult Health

## 2019-02-16 NOTE — Telephone Encounter (Signed)
Spoke with patient re message to reschedule 10/20 visit. Patient will keep appointment as scheduled - mychart video visit.

## 2019-02-17 ENCOUNTER — Inpatient Hospital Stay: Payer: Managed Care, Other (non HMO) | Attending: Hematology and Oncology | Admitting: Adult Health

## 2019-02-17 ENCOUNTER — Encounter: Payer: Self-pay | Admitting: Adult Health

## 2019-02-17 DIAGNOSIS — Z9221 Personal history of antineoplastic chemotherapy: Secondary | ICD-10-CM | POA: Diagnosis not present

## 2019-02-17 DIAGNOSIS — Z923 Personal history of irradiation: Secondary | ICD-10-CM

## 2019-02-17 DIAGNOSIS — C50411 Malignant neoplasm of upper-outer quadrant of right female breast: Secondary | ICD-10-CM

## 2019-02-17 DIAGNOSIS — Z171 Estrogen receptor negative status [ER-]: Secondary | ICD-10-CM | POA: Diagnosis not present

## 2019-02-17 NOTE — Progress Notes (Signed)
SURVIVORSHIP VIRTUAL VISIT:  I connected with Katrina Liu on 02/17/19 at 10:00 AM EDT by my chart video and verified that I am speaking with the correct person using two identifiers.   I discussed the limitations, risks, security and privacy concerns of performing an evaluation and management service virtually and the availability of in person appointments. I also discussed with the patient that there may be a patient responsible charge related to this service. The patient expressed understanding and agreed to proceed.   BRIEF ONCOLOGIC HISTORY:  Oncology History  Malignant neoplasm of upper-outer quadrant of right breast in female, estrogen receptor negative ([place])  05/29/2018 Initial Diagnosis   Right axillary pain which led to a mammogram and ultrasound that revealed a new mass in the superior UOQ tail of the breast 10 o'clock position by ultrasound measured 8 mm, axilla negative, biopsy revealed grade 3 IDC triple negative with a Ki-67 of 20%, T1BN0 stage Ib clinical stage   06/04/2018 Cancer Staging   Staging form: Breast, AJCC 8th Edition - Clinical stage from 06/04/2018: Stage IB (cT1b, cN0, cM0, G3, ER-, PR-, HER2-) - Signed by Nicholas Lose, MD on 06/04/2018   06/17/2018 -  Neo-Adjuvant Chemotherapy   Dose dense Adriamycin and Cytoxan x4 followed by Taxol and carboplatin   07/15/2018 Genetic Testing   No pathogenic variants identified (negative testing) on the [facility] CancerNext+RNAinsight panel. The CancerNext gene panel offered by [employer] includes sequencing and rearrangement analysis for the following 34 genes:   APC, ATM, BARD1, BMPR1A, BRCA1, BRCA2, BRIP1, CDH1, CDK4, CDKN2A, CHEK2, DICER1, EPCAM, GREM1, HOXB13MLH1, MRE11A, MSH2, MSH6, MUTYH, NBN, NF1, PALB2, PMS2, POLD1, POLE, PTEN, RAD50, RAD51D, RAD51C, SMAD4, SMARCA4, STK11, and TP53. The report date is 07/15/2018.   10/02/2018 Surgery   Right Lumpectomy ([facility]): no residual carcinoma s/p neoadjuvant treatment, 0/3 lymph nodes  negative for carcinoma.    11/06/2018 - 12/22/2018 Radiation Therapy   The patient initially received a dose of 50.4 Gy in 28 fractions to the breast using whole-breast tangent fields. This was delivered using a 3-D conformal technique. The patient then received a boost to the seroma. This delivered an additional 10 Gy in 5 fractions using a electron boost with 33mV electrons. The total dose was 60.4 Gy.     INTERVAL HISTORY:  Katrina Liu review her survivorship care plan detailing her treatment course for breast cancer, as well as monitoring long-term side effects of that treatment, education regarding health maintenance, screening, and overall wellness and health promotion.     Overall, Katrina Liu feeling quite well.  She is healing well.  She had some nail dyscrasia from the chemotherapy, and the darkness is starting to grow out. She is taking the hair skin and nail vitamins.  She has difficulty doing a breast exam that makes it challenging.    REVIEW OF SYSTEMS:  Review of Systems  Constitutional: Negative for appetite change, chills, fatigue, fever and unexpected weight change.  HENT:   Negative for hearing loss, lump/mass and trouble swallowing.   Eyes: Negative for eye problems and icterus.  Respiratory: Negative for chest tightness, cough, shortness of breath and wheezing.   Cardiovascular: Negative for chest pain, leg swelling and palpitations.  Gastrointestinal: Negative for abdominal distention, abdominal pain, constipation, diarrhea, nausea and vomiting.  Endocrine: Negative for hot flashes.  Genitourinary: Negative for difficulty urinating.   Musculoskeletal: Negative for arthralgias.  Skin: Negative for itching and rash.  Neurological: Negative for dizziness, extremity weakness, headaches and numbness.  Hematological: Negative  for adenopathy. Does not bruise/bleed easily.  Psychiatric/Behavioral: Negative for depression. The patient is not nervous/anxious.         ONCOLOGY TREATMENT TEAM:  1. Surgeon:  Dr. Brantley Stage at [facility] Surgery 2. Medical Oncologist: Dr. Lindi Adie  3. Radiation Oncologist: Dr. Lisbeth Renshaw    PAST MEDICAL/SURGICAL HISTORY:  Past Medical History:  Diagnosis Date  . Anemia   . Family history of prostate cancer   . GERD (gastroesophageal reflux disease)    h/o - not now  . Headache(784.0)   . Hypertension   . S/P abdominal hysterectomy 04/17/2011  . Stroke [facility])    no residual   Past Surgical History:  Procedure Laterality Date  . ABDOMINAL HYSTERECTOMY  04/16/2011   Procedure: HYSTERECTOMY ABDOMINAL;  Surgeon: Marvene Staff, MD;  Location: [facility];  Service: Gynecology;  Laterality: N/A;  . BREAST BIOPSY Left 2013  . BREAST LUMPECTOMY WITH RADIOACTIVE SEED AND SENTINEL LYMPH NODE BIOPSY Right 10/02/2018   Procedure: RIGHT BREAST LUMPECTOMY WITH RADIOACTIVE SEED AND RIGHT SENTINEL LYMPH NODE MAPPING;  Surgeon: Erroll Luna, MD;  Location: [place];  Service: General;  Laterality: Right;  . KIDNEY STONE SURGERY    . LAPAROTOMY     ectopic pregnancy  . LOOP RECORDER IMPLANT N/A 12/24/2012   Procedure: LOOP RECORDER IMPLANT;  Surgeon: Deboraha Sprang, MD;  Location: [facility] CATH LAB;  Service: Cardiovascular;  Laterality: N/A;  . LOOP RECORDER REMOVAL N/A 08/15/2016   Procedure: Loop Recorder Removal;  Surgeon: Deboraha Sprang, MD;  Location: [place] CV LAB;  Service: Cardiovascular;  Laterality: N/A;  . SHOULDER ARTHROSCOPY    . TEE WITHOUT CARDIOVERSION N/A 12/24/2012   Procedure: TRANSESOPHAGEAL ECHOCARDIOGRAM (TEE);  Surgeon: Jettie Booze, MD;  Location: [facility] ENDOSCOPY;  Service: Cardiovascular;  Laterality: N/A;     ALLERGIES:  Allergies  Allergen Reactions  . Lisinopril Swelling     CURRENT MEDICATIONS:  Outpatient Encounter Medications as of 02/17/2019  Medication Sig  . acyclovir (ZOVIRAX) 200 MG capsule Take 1 capsule (200 mg total) by mouth 5 (five) times daily.  (Patient not taking: Reported on 10/21/2018)  . Ascorbic Acid (VITAMIN C) 500 MG CAPS Take 500 mg by mouth daily.   [name] aspirin EC 81 MG EC tablet Take 1 tablet (81 mg total) by mouth daily.  . Biotin 10000 MCG TABS Take 10,000 mcg by mouth daily.  . Calcium Carbonate-Vitamin D (CALCIUM 500/D PO) Take by mouth.  . chlorthalidone (HYGROTON) 25 MG tablet Take 1 tablet (25 mg total) by mouth daily.  . Cyanocobalamin (VITAMIN B 12 PO) Take by mouth.  . dicyclomine (BENTYL) 10 MG capsule Take 1 capsule (10 mg total) by mouth 2 (two) times daily as needed for spasms. (Patient not taking: Reported on 10/21/2018)  . Flaxseed, Linseed, (FLAX SEED OIL) 1000 MG CAPS Take 1,000 mg by mouth once a week.  . gabapentin (NEURONTIN) 300 MG capsule Take 1 capsule (300 mg total) by mouth at bedtime. (Patient not taking: Reported on 10/21/2018)  . labetalol (NORMODYNE) 100 MG tablet Take 1 tablet (100 mg total) by mouth 2 (two) times daily.  [name] lidocaine (XYLOCAINE) 2 % solution Use as directed 15 mLs in the mouth or throat every 3 (three) hours as needed for mouth pain. (Patient not taking: Reported on 10/21/2018)  . magic mouthwash SOLN Take 5 mLs by mouth 4 (four) times daily as needed for mouth pain. Swish and swallow or spit (Patient not taking: Reported on 10/21/2018)  .  mometasone (ELOCON) 0.1 % ointment Apply 1 application topically as needed (dry skin).   . Multiple Vitamin (MULTIVITAMIN) tablet Take 1 tablet by mouth daily.  [name] nystatin (MYCOSTATIN) 100000 UNIT/ML suspension Take 5 mLs (500,000 Units total) by mouth 4 (four) times daily. (Patient not taking: Reported on 10/21/2018)  . oxyCODONE (ROXICODONE) 5 MG/5ML solution 5 to 10 mg Q 4 hours prn pain (Patient not taking: Reported on 10/21/2018)  . potassium chloride SA (K-DUR,KLOR-CON) 20 MEQ tablet Take 1 tablet (20 mEq total) by mouth daily.  [name] triamcinolone cream (KENALOG) 0.1 % Apply to hands TID prn for rash (Patient not taking: Reported on 10/21/2018)  .  Turmeric 500 MG TABS Take 500 mg by mouth daily.  [name] zinc gluconate 50 MG tablet Take 50 mg by mouth daily.  . [DISCONTINUED] prochlorperazine (COMPAZINE) 10 MG tablet Take 1 tablet (10 mg total) by mouth every 6 (six) hours as needed (Nausea or vomiting).   No facility-administered encounter medications on file as of 02/17/2019.      ONCOLOGIC FAMILY HISTORY:  Family History  Problem Relation Age of Onset  . Stroke Father 94  . Lung cancer Father   . Prostate cancer Paternal Uncle        dx 52's?  . Diabetes Maternal Grandfather   . Breast cancer Neg Hx      GENETIC COUNSELING/TESTING: negative  SOCIAL HISTORY:  Social History   Socioeconomic History  . Marital status: Divorced    Spouse name: Not on file  . Number of children: 1  . Years of education: 58  . Highest education level: Not on file  Occupational History  . Occupation: [employer]    Employer: [employer]  Social Needs  . Financial resource strain: Not on file  . Food insecurity    Worry: Not on file    Inability: Not on file  . Transportation needs    Medical: Not on file    Non-medical: Not on file  Tobacco Use  . Smoking status: Never Smoker  . Smokeless tobacco: Never Used  Substance and Sexual Activity  . Alcohol use: No    Alcohol/week: 0.0 standard drinks  . Drug use: No  . Sexual activity: Not on file  Lifestyle  . Physical activity    Days per week: Not on file    Minutes per session: Not on file  . Stress: Not on file  Relationships  . Social Herbalist on phone: Not on file    Gets together: Not on file    Attends religious service: Not on file    Active member of club or organization: Not on file    Attends meetings of clubs or organizations: Not on file    Relationship status: Not on file  . Intimate partner violence    Fear of current or ex partner: Not on file    Emotionally abused: Not on file    Physically abused: Not on file    Forced sexual  activity: Not on file  Other Topics Concern  . Not on file  Social History Narrative   Patient is single with one child.   Patient is left handed.   Patient has hs education.   Patient drinks 1/2 cup daily.     OBSERVATIONS/OBJECTIVE:  Patient appears well.  She is in no apparent distress.  Breathing is non labored.  Her skin visualized is without rash or lesion.  Mood and behavior are normal.  LABORATORY DATA:  None for this visit.  DIAGNOSTIC IMAGING:  None for this visit.      ASSESSMENT AND PLAN:  Katrina Liu is a pleasant 58 y.o. female with Stage IB right breast invasive ductal carcinoma, ER-/PR-/HER2-, diagnosed in 04/2018, treated with neoadjuvant chemotherapy, lumpectomy, and adjuvant radiation therapy.  She presents to the Survivorship Clinic for our initial meeting and routine follow-up post-completion of treatment for breast cancer.    1. Stage IB right breast cancer:  Katrina Liu is continuing to recover from definitive treatment for breast cancer. She will follow-up with her medical oncologist, Dr. Lindi Adie in 07/2018 with history and physical exam per surveillance protocol.  . Her mammogram is due 06/2018; orders placed today. Today, a comprehensive survivorship care plan and treatment summary was reviewed with the patient today detailing her breast cancer diagnosis, treatment course, potential late/long-term effects of treatment, appropriate follow-up care with recommendations for the future, and patient education resources.  A copy of this summary, along with a letter will be sent to the patient's primary care provider via mail/fax/In Basket message after today's visit.    2. Breast swelling: I recommended she wear a tighter fit, more compressing bra, and to also massage her breast with vitamin E oil.    3. Stitch at port site: I will reach out to Dr. Brantley Stage and see if he can evaluate this and also do her breast cancer f/u later this month.    4. Bone health:  Given Ms.  Liu age/history of breast cancer, she is at slight risk for bone demineralization.  She is going to f/u with GYN regarding bone density testing, recommendations, and management.  She was given education on specific activities to promote bone health.  5. Cancer screening:  Due to Katrina Liu history and her age, she should receive screening for skin cancers, colon cancer, and gynecologic cancers.  The information and recommendations are listed on the patient's comprehensive care plan/treatment summary and were reviewed in detail with the patient.    6. Health maintenance and wellness promotion: Katrina Liu was encouraged to consume 5-7 servings of fruits and vegetables per day. We reviewed the "Nutrition Rainbow" handout, as well as the handout "Take Control of Your Health and Reduce Your Cancer Risk" from the [place].  She was also encouraged to engage in moderate to vigorous exercise for 30 minutes per day most days of the week. We discussed the [employer] fitness program, which is designed for cancer survivors to help them become more physically fit after cancer treatments.  She was instructed to limit her alcohol consumption and continue to abstain from tobacco use.     7. Support services/counseling: It is not uncommon for this period of the patient's cancer care trajectory to be one of many emotions and stressors.  We discussed how this can be increasingly difficult during the times of quarantine and social distancing due to the COVID-19 pandemic.   She was given information regarding our available services and encouraged to contact me with any questions or for help enrolling in any of our support group/programs.    Follow up instructions:    -Return to cancer center in 07/2018 for f/u with Dr. Lindi Adie   -Mammogram due in 06/2018 -Follow up with surgery 01/2019 -She is welcome to return back to the Survivorship Clinic at any time; no additional follow-up needed at this time.   -Consider referral back to survivorship as a long-term survivor for continued surveillance  The patient  was provided an opportunity to ask questions and all were answered. The patient agreed with the plan and demonstrated an understanding of the instructions.   The patient was advised to call back or seek an in-person evaluation if the symptoms worsen or if the condition fails to improve as anticipated.   I provided 20 minutes of face-to-face video visit time during this encounter, and > 50% was spent counseling as documented under my assessment & plan.  Scot Dock, NP

## 2019-02-19 ENCOUNTER — Telehealth: Payer: Self-pay | Admitting: Oncology

## 2019-02-19 NOTE — Telephone Encounter (Signed)
Returned patient's phone call regarding cancelling October port flush appointment, patient informed me she no longer has a port and would like the appointment to be cancelled.

## 2019-03-19 ENCOUNTER — Encounter: Payer: Self-pay | Admitting: Hematology and Oncology

## 2019-03-20 ENCOUNTER — Other Ambulatory Visit: Payer: Self-pay

## 2019-03-20 DIAGNOSIS — Z20822 Contact with and (suspected) exposure to covid-19: Secondary | ICD-10-CM

## 2019-03-23 LAB — NOVEL CORONAVIRUS, NAA: SARS-CoV-2, NAA: NOT DETECTED

## 2019-04-08 ENCOUNTER — Encounter: Payer: Self-pay | Admitting: Adult Health

## 2019-04-09 ENCOUNTER — Other Ambulatory Visit: Payer: Self-pay | Admitting: Adult Health

## 2019-04-09 DIAGNOSIS — E2839 Other primary ovarian failure: Secondary | ICD-10-CM

## 2019-05-26 ENCOUNTER — Ambulatory Visit: Payer: Managed Care, Other (non HMO) | Attending: Internal Medicine

## 2019-05-26 DIAGNOSIS — Z20822 Contact with and (suspected) exposure to covid-19: Secondary | ICD-10-CM

## 2019-05-27 LAB — NOVEL CORONAVIRUS, NAA: SARS-CoV-2, NAA: NOT DETECTED

## 2019-06-08 ENCOUNTER — Encounter: Payer: Self-pay | Admitting: Adult Health

## 2019-06-30 LAB — BASIC METABOLIC PANEL
BUN: 13 (ref 4–21)
CO2: 33 — AB (ref 13–22)
Chloride: 102 (ref 99–108)
Creatinine: 0.8 (ref 0.5–1.1)
Glucose: 88
Potassium: 3.5 (ref 3.4–5.3)
Sodium: 143 (ref 137–147)

## 2019-06-30 LAB — COMPREHENSIVE METABOLIC PANEL
Albumin: 4.6 (ref 3.5–5.0)
Calcium: 10.2 (ref 8.7–10.7)
GFR calc Af Amer: 69
GFR calc non Af Amer: 84

## 2019-06-30 LAB — LIPID PANEL
Cholesterol: 206 — AB (ref 0–200)
HDL: 77 — AB (ref 35–70)
LDL Cholesterol: 119
LDl/HDL Ratio: 2.7
Triglycerides: 56 (ref 40–160)

## 2019-06-30 LAB — HEPATIC FUNCTION PANEL
ALT: 12 (ref 7–35)
AST: 16 (ref 13–35)
Bilirubin, Total: 0.7

## 2019-07-02 ENCOUNTER — Encounter: Payer: Self-pay | Admitting: Adult Health

## 2019-07-02 ENCOUNTER — Telehealth: Payer: Self-pay | Admitting: Hematology and Oncology

## 2019-07-02 NOTE — Telephone Encounter (Signed)
Scheduled appt per 3/4 sch message - pt is aware of appt date and time   

## 2019-07-08 NOTE — Progress Notes (Signed)
Patient Care Team: [name], MD as PCP - General (Family Medicine) Nicholas Lose, MD as Consulting Physician (Hematology and Oncology) Kyung Rudd, MD as Consulting Physician (Radiation Oncology) Erroll Luna, MD as Consulting Physician (General Surgery)  DIAGNOSIS:    ICD-10-CM   1. Malignant neoplasm of upper-outer quadrant of right breast in female, estrogen receptor negative ([place])  C50.411    Z17.1     SUMMARY OF ONCOLOGIC HISTORY: Oncology History  Malignant neoplasm of upper-outer quadrant of right breast in female, estrogen receptor negative ([place])  05/29/2018 Initial Diagnosis   Right axillary pain which led to a mammogram and ultrasound that revealed a new mass in the superior UOQ tail of the breast 10 o'clock position by ultrasound measured 8 mm, axilla negative, biopsy revealed grade 3 IDC triple negative with a Ki-67 of 20%, T1BN0 stage Ib clinical stage   06/04/2018 Cancer Staging   Staging form: Breast, AJCC 8th Edition - Clinical stage from 06/04/2018: Stage IB (cT1b, cN0, cM0, G3, ER-, PR-, HER2-) - Signed by Nicholas Lose, MD on 06/04/2018   06/17/2018 -  Neo-Adjuvant Chemotherapy   Dose dense Adriamycin and Cytoxan x4 followed by Taxol and carboplatin   07/15/2018 Genetic Testing   No pathogenic variants identified (negative testing) on the [facility] CancerNext+RNAinsight panel. The CancerNext gene panel offered by [employer] includes sequencing and rearrangement analysis for the following 34 genes:   APC, ATM, BARD1, BMPR1A, BRCA1, BRCA2, BRIP1, CDH1, CDK4, CDKN2A, CHEK2, DICER1, EPCAM, GREM1, HOXB13MLH1, MRE11A, MSH2, MSH6, MUTYH, NBN, NF1, PALB2, PMS2, POLD1, POLE, PTEN, RAD50, RAD51D, RAD51C, SMAD4, SMARCA4, STK11, and TP53. The report date is 07/15/2018.   10/02/2018 Surgery   Right Lumpectomy ([facility]): no residual carcinoma s/p neoadjuvant treatment, 0/3 lymph nodes negative for carcinoma.    11/06/2018 - 12/22/2018 Radiation Therapy   The patient initially  received a dose of 50.4 Gy in 28 fractions to the breast using whole-breast tangent fields. This was delivered using a 3-D conformal technique. The patient then received a boost to the seroma. This delivered an additional 10 Gy in 5 fractions using a electron boost with 26mV electrons. The total dose was 60.4 Gy.     CHIEF COMPLIANT: Surveillance of triple negative right breast cancer  INTERVAL HISTORY: Katrina MCDIARMIDis a 59y.o. with above-mentioned history of triple negative right breast cancer who completed neoadjuvant chemotherapy, lumpectomy, radiation, and is currently on surveillance. She presents to the clinic today for follow-up.  She is recovered very well from chemotherapy side effects.  Her hair has returned.  Energy levels are improving.  She is working full-time.  She is looking forward to her 59th birthday which [place]. She is also expecting a grandchild in June.  Continues to have very mild neuropathy.  ALLERGIES:  is allergic to lisinopril.  MEDICATIONS:  Current Outpatient Medications  Medication Sig Dispense Refill  . acyclovir (ZOVIRAX) 200 MG capsule Take 1 capsule (200 mg total) by mouth 5 (five) times daily. 35 capsule 0  . Ascorbic Acid (VITAMIN C) 500 MG CAPS Take 500 mg by mouth daily.     .Marland Kitchenaspirin EC 81 MG EC tablet Take 1 tablet (81 mg total) by mouth daily. 90 tablet 0  . Biotin 10000 MCG TABS Take 10,000 mcg by mouth daily.    . Calcium Carbonate-Vitamin D (CALCIUM 500/D PO) Take by mouth.    . chlorthalidone (HYGROTON) 25 MG tablet Take 1 tablet (  25 mg total) by mouth daily. 90 tablet 2  . Cyanocobalamin (VITAMIN B 12 PO) Take by mouth.    . dicyclomine (BENTYL) 10 MG capsule Take 1 capsule (10 mg total) by mouth 2 (two) times daily as needed for spasms. 30 capsule 1  . Flaxseed, Linseed, (FLAX SEED OIL) 1000 MG CAPS Take 1,000 mg by mouth once a week.    . gabapentin (NEURONTIN) 300 MG capsule Take 1  capsule (300 mg total) by mouth at bedtime. 30 capsule 3  . labetalol (NORMODYNE) 100 MG tablet Take 1 tablet (100 mg total) by mouth 2 (two) times daily. 180 tablet 3  . lidocaine (XYLOCAINE) 2 % solution Use as directed 15 mLs in the mouth or throat every 3 (three) hours as needed for mouth pain. 200 mL 2  . magic mouthwash SOLN Take 5 mLs by mouth 4 (four) times daily as needed for mouth pain. Swish and swallow or spit 240 mL 2  . mometasone (ELOCON) 0.1 % ointment Apply 1 application topically as needed (dry skin).     . Multiple Vitamin (MULTIVITAMIN) tablet Take 1 tablet by mouth daily.    [name] nystatin (MYCOSTATIN) 100000 UNIT/ML suspension Take 5 mLs (500,000 Units total) by mouth 4 (four) times daily. 150 mL 0  . oxyCODONE (ROXICODONE) 5 MG/5ML solution 5 to 10 mg Q 4 hours prn pain 150 mL 0  . potassium chloride SA (K-DUR,KLOR-CON) 20 MEQ tablet Take 1 tablet (20 mEq total) by mouth daily. 30 tablet 2  . triamcinolone cream (KENALOG) 0.1 % Apply to hands TID prn for rash 80 g 1  . Turmeric 500 MG TABS Take 500 mg by mouth daily.    [name] zinc gluconate 50 MG tablet Take 50 mg by mouth daily.     No current facility-administered medications for this visit.    PHYSICAL EXAMINATION: ECOG PERFORMANCE STATUS: 1 - Symptomatic but completely ambulatory  Vitals:   07/09/19 0843  BP: (!) 141/81  Pulse: 67  Resp: 18  Temp: 98.2 F (36.8 C)  SpO2: 100%   Filed Weights   07/09/19 0843  Weight: 137 lb 8 oz (62.4 kg)    BREAST: No palpable masses or nodules in either right or left breasts. No palpable axillary supraclavicular or infraclavicular adenopathy no breast tenderness or nipple discharge. (exam performed in the presence of a chaperone)  LABORATORY DATA:  I have reviewed the data as listed CMP Latest Ref Rng & Units 09/29/2018 09/03/2018 08/25/2018  Glucose 70 - 99 mg/dL 91 110(H) 96  BUN 6 - 20 mg/dL _0 Creatinine 0.44 - 1.00 mg/dL 0.93 0.82 0.85  Sodium 135 - 145 mmol/L 141  138 139  Potassium 3.5 - 5.1 mmol/L 3.8 4.0 3.9  Chloride 98 - 111 mmol/L 104 105 106  CO2 22 - 32 mmol/L _1 Calcium 8.9 - 10.3 mg/dL 9.9 9.1 9.2  Total Protein 6.5 - 8.1 g/dL 6.8 6.4(L) 6.7  Total Bilirubin 0.3 - 1.2 mg/dL 0.8 0.3 0.4  Alkaline Phos 38 - 126 U/L 77 68 80  AST 15 - 41 U/L 22 13(L) 46(H)  ALT 0 - 44 U/L 18 14 57(H)    Lab Results  Component Value Date   WBC 2.9 (L) 09/29/2018   HGB 11.9 (L) 09/29/2018   HCT 36.7 09/29/2018   MCV 94.8 09/29/2018   PLT 261 09/29/2018   NEUTROABS 1.4 (L) 09/29/2018    ASSESSMENT & PLAN:  Malignant neoplasm of upper-outer  quadrant of right breast in female, estrogen receptor negative ([place]) 05/29/2018:Right axillary pain which led to a mammogram and ultrasound that revealed a new mass in the superior UOQ tail of the breast 10 o'clock position by ultrasound measured 8 mm, axilla negative, 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 Neoadjuvant chemotherapy with Adriamycin and Cytoxan dose dense 4 followed byTaxolweekly 2and carboplatin every 3 weeks ( Discontinued due to toxicities) 2. 10/02/2018:Right Lumpectomy ([facility]): no residual carcinoma s/p neoadjuvant treatment, 0/3 lymph nodes negative for carcinoma. (Path CR) 3. Followed by adjuvant radiation therapy completed 12/22/2018 Genetics negative ---------------------------------------------------------------------------------------------------------------------------------- Breast Cancer Surveillance:  1. Mammograms to be done 07/10/2019 2. Breast Exam:07/09/19: Benign Bone density to be done 07/10/2019 as well  Patient continues to have minor adverse effects including Brittle nails Memory issues Mild peripheral neuropathy  Patient plans to drive from [place] to [place] for her 59th birthday. She is expecting her first grandchild in June 2021.  Survivorship: I encouraged her to continue to stay  active exercise regularly and eat healthy with less carbohydrates.  She tells me that she stopped eating chips.  RTC in 1 year   No orders of the defined types were placed in this encounter.  The patient has a good understanding of the overall plan. she agrees with it. she will call with any problems that may develop before the next visit here.  Total time spent: 20 mins including face to face time and time spent for planning, charting and coordination of care  Nicholas Lose, MD 07/09/2019  I, Cloyde Reams Dorshimer, am acting as scribe for Dr. Nicholas Lose.  I have reviewed the above documentation for accuracy and completeness, and I agree with the above.

## 2019-07-09 ENCOUNTER — Telehealth: Payer: Self-pay | Admitting: Hematology and Oncology

## 2019-07-09 ENCOUNTER — Inpatient Hospital Stay: Payer: Managed Care, Other (non HMO) | Attending: Hematology and Oncology | Admitting: Hematology and Oncology

## 2019-07-09 ENCOUNTER — Other Ambulatory Visit: Payer: Self-pay

## 2019-07-09 DIAGNOSIS — Z9221 Personal history of antineoplastic chemotherapy: Secondary | ICD-10-CM | POA: Diagnosis not present

## 2019-07-09 DIAGNOSIS — Z923 Personal history of irradiation: Secondary | ICD-10-CM | POA: Diagnosis not present

## 2019-07-09 DIAGNOSIS — C50411 Malignant neoplasm of upper-outer quadrant of right female breast: Secondary | ICD-10-CM | POA: Diagnosis not present

## 2019-07-09 DIAGNOSIS — Z171 Estrogen receptor negative status [ER-]: Secondary | ICD-10-CM | POA: Insufficient documentation

## 2019-07-09 NOTE — Assessment & Plan Note (Signed)
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 Neoadjuvant chemotherapy with Adriamycin and Cytoxan dose dense 4 followed byTaxolweekly 2and carboplatin every 3 weeks ( Discontinued due to toxicities) 2. 10/02/2018:Right Lumpectomy ([facility]): no residual carcinoma s/p neoadjuvant treatment, 0/3 lymph nodes negative for carcinoma. (Path CR) 3. Followed by adjuvant radiation therapy completed 12/22/2018 Genetics negative ---------------------------------------------------------------------------------------------------------------------------------- Breast Cancer Surveillance:  1. Mammograms to be done 07/10/2019 2. Breast Exam:07/09/19: Benign  RTC in 1 year

## 2019-07-09 NOTE — Telephone Encounter (Signed)
I talk with patient regarding schedule  

## 2019-07-10 ENCOUNTER — Ambulatory Visit
Admission: RE | Admit: 2019-07-10 | Discharge: 2019-07-10 | Disposition: A | Discharge status: Home / Self Care | Payer: Managed Care, Other (non HMO) | Source: Ambulatory Visit | Attending: Adult Health | Admitting: Adult Health

## 2019-07-10 ENCOUNTER — Ambulatory Visit: Payer: Managed Care, Other (non HMO) | Attending: Internal Medicine

## 2019-07-10 DIAGNOSIS — Z23 Encounter for immunization: Secondary | ICD-10-CM

## 2019-07-10 DIAGNOSIS — C50411 Malignant neoplasm of upper-outer quadrant of right female breast: Secondary | ICD-10-CM

## 2019-07-10 DIAGNOSIS — E2839 Other primary ovarian failure: Secondary | ICD-10-CM

## 2019-07-10 HISTORY — DX: Personal history of irradiation: Z92.3

## 2019-07-10 NOTE — Progress Notes (Signed)
   Covid-19 Vaccination Clinic  Name:  Katrina Liu    MRN: [id number] DOB: Mar 27, 1961  07/10/2019  Katrina Liu was observed post Covid-19 immunization for 30 minutes based on pre-vaccination screening without incident. She was provided with Vaccine Information Sheet and instruction to access the V-Safe system.   Katrina Liu was instructed to call 911 with any severe reactions post vaccine: [name] Difficulty breathing  . Swelling of face and throat  . A fast heartbeat  . A bad rash all over body  . Dizziness and weakness   Immunizations Administered    Name Date Dose VIS Date Route   Pfizer COVID-19 Vaccine 07/10/2019  3:20 PM 0.3 mL 04/10/2019 Intramuscular   Manufacturer: [place]   Lot: [id number]   [place]: [id number]

## 2019-07-13 ENCOUNTER — Telehealth: Payer: Self-pay | Admitting: *Deleted

## 2019-07-13 NOTE — Telephone Encounter (Signed)
Per Wilber Bihari NP, called to make pt aware the her bone density shows mild osteopenia. Advised to take calcium, vitamin D, do weight bearing exercises and do a repeat in 2 years. Pt is already taking calcium and vitamin D daily but only 1 calcium daily. Advised to take calcium twice daily as directed. Pt verbalized understanding.

## 2019-08-04 ENCOUNTER — Ambulatory Visit: Payer: Managed Care, Other (non HMO) | Attending: Internal Medicine

## 2019-08-04 DIAGNOSIS — Z23 Encounter for immunization: Secondary | ICD-10-CM

## 2019-08-04 NOTE — Progress Notes (Signed)
   Covid-19 Vaccination Clinic  Name:  TENAY MALZ    MRN: [id number] DOB: 1960-09-09  08/04/2019  Ms. Polcyn was observed post Covid-19 immunization for 15 minutes without incident. She was provided with Vaccine Information Sheet and instruction to access the V-Safe system.   Ms. Mahle was instructed to call 911 with any severe reactions post vaccine: [name] Difficulty breathing  . Swelling of face and throat  . A fast heartbeat  . A bad rash all over body  . Dizziness and weakness   Immunizations Administered    Name Date Dose VIS Date Route   Pfizer COVID-19 Vaccine 08/04/2019  8:54 AM 0.3 mL 04/10/2019 Intramuscular   Manufacturer: [place]   Lot: [id number]   [place]: [id number]

## 2019-10-16 ENCOUNTER — Encounter: Payer: Self-pay | Admitting: Hematology and Oncology

## 2020-01-27 ENCOUNTER — Other Ambulatory Visit: Payer: Managed Care, Other (non HMO)

## 2020-03-15 ENCOUNTER — Encounter: Payer: Self-pay | Admitting: Nurse Practitioner

## 2020-03-28 ENCOUNTER — Other Ambulatory Visit: Payer: Self-pay | Admitting: Obstetrics and Gynecology

## 2020-03-28 DIAGNOSIS — E049 Nontoxic goiter, unspecified: Secondary | ICD-10-CM

## 2020-03-30 ENCOUNTER — Other Ambulatory Visit: Payer: Self-pay | Admitting: Obstetrics and Gynecology

## 2020-03-30 DIAGNOSIS — N63 Unspecified lump in unspecified breast: Secondary | ICD-10-CM

## 2020-04-11 ENCOUNTER — Ambulatory Visit
Admission: RE | Admit: 2020-04-11 | Discharge: 2020-04-11 | Disposition: A | Discharge status: Home / Self Care | Payer: Managed Care, Other (non HMO) | Source: Ambulatory Visit | Attending: Obstetrics and Gynecology | Admitting: Obstetrics and Gynecology

## 2020-04-11 DIAGNOSIS — E049 Nontoxic goiter, unspecified: Secondary | ICD-10-CM

## 2020-04-20 ENCOUNTER — Ambulatory Visit
Admission: RE | Admit: 2020-04-20 | Discharge: 2020-04-20 | Disposition: A | Discharge status: Home / Self Care | Payer: Managed Care, Other (non HMO) | Source: Ambulatory Visit | Attending: Obstetrics and Gynecology | Admitting: Obstetrics and Gynecology

## 2020-04-20 ENCOUNTER — Other Ambulatory Visit: Payer: Self-pay | Admitting: Obstetrics and Gynecology

## 2020-04-20 ENCOUNTER — Other Ambulatory Visit: Payer: Self-pay

## 2020-04-20 DIAGNOSIS — N63 Unspecified lump in unspecified breast: Secondary | ICD-10-CM

## 2020-04-20 HISTORY — DX: Personal history of antineoplastic chemotherapy: Z92.21

## 2020-04-26 ENCOUNTER — Ambulatory Visit
Admission: RE | Admit: 2020-04-26 | Discharge: 2020-04-26 | Disposition: A | Discharge status: Home / Self Care | Payer: Managed Care, Other (non HMO) | Source: Ambulatory Visit | Attending: Obstetrics and Gynecology | Admitting: Obstetrics and Gynecology

## 2020-04-26 ENCOUNTER — Other Ambulatory Visit: Payer: Self-pay

## 2020-04-26 DIAGNOSIS — N63 Unspecified lump in unspecified breast: Secondary | ICD-10-CM

## 2020-05-06 ENCOUNTER — Other Ambulatory Visit: Payer: Managed Care, Other (non HMO)

## 2020-06-09 ENCOUNTER — Other Ambulatory Visit: Payer: Self-pay | Admitting: Obstetrics and Gynecology

## 2020-06-09 DIAGNOSIS — Z9889 Other specified postprocedural states: Secondary | ICD-10-CM

## 2020-07-01 LAB — COMPREHENSIVE METABOLIC PANEL
Albumin: 4.3 (ref 3.5–5.0)
Calcium: 9.8 (ref 8.7–10.7)
GFR calc Af Amer: 66
GFR calc non Af Amer: 80

## 2020-07-01 LAB — HEPATIC FUNCTION PANEL
ALT: 12 (ref 7–35)
AST: 19 (ref 13–35)
Alkaline Phosphatase: 81 (ref 25–125)
Bilirubin, Total: 0.5

## 2020-07-01 LAB — BASIC METABOLIC PANEL
BUN: 11 (ref 4–21)
CO2: 32 — AB (ref 13–22)
Chloride: 104 (ref 99–108)
Creatinine: 0.9 (ref 0.5–1.1)
Glucose: 85
Potassium: 3.1 — AB (ref 3.4–5.3)
Sodium: 141 (ref 137–147)

## 2020-07-01 LAB — LIPID PANEL
Cholesterol: 184 (ref 0–200)
HDL: 69 (ref 35–70)
LDL Cholesterol: 105
Triglycerides: 49 (ref 40–160)

## 2020-07-01 LAB — VITAMIN D 25 HYDROXY (VIT D DEFICIENCY, FRACTURES): Vit D, 25-Hydroxy: 38.1

## 2020-07-12 ENCOUNTER — Other Ambulatory Visit: Payer: Self-pay | Admitting: Family Medicine

## 2020-07-12 DIAGNOSIS — E7800 Pure hypercholesterolemia, unspecified: Secondary | ICD-10-CM

## 2020-07-21 ENCOUNTER — Ambulatory Visit
Admission: RE | Admit: 2020-07-21 | Discharge: 2020-07-21 | Disposition: A | Discharge status: Home / Self Care | Payer: Managed Care, Other (non HMO) | Source: Ambulatory Visit | Attending: Obstetrics and Gynecology | Admitting: Obstetrics and Gynecology

## 2020-07-21 ENCOUNTER — Other Ambulatory Visit: Payer: Self-pay

## 2020-07-21 DIAGNOSIS — Z9889 Other specified postprocedural states: Secondary | ICD-10-CM

## 2020-08-02 ENCOUNTER — Other Ambulatory Visit: Payer: Managed Care, Other (non HMO)

## 2020-08-07 NOTE — Progress Notes (Signed)
Patient Care Team: [name], MD as PCP - General (Family Medicine) Nicholas Lose, MD as Consulting Physician (Hematology and Oncology) Kyung Rudd, MD as Consulting Physician (Radiation Oncology) Erroll Luna, MD as Consulting Physician (General Surgery)  DIAGNOSIS:    ICD-10-CM   1. Malignant neoplasm of upper-outer quadrant of right breast in female, estrogen receptor negative ([place])  C50.411    Z17.1     SUMMARY OF ONCOLOGIC HISTORY: Oncology History  Malignant neoplasm of upper-outer quadrant of right breast in female, estrogen receptor negative ([place])  05/29/2018 Initial Diagnosis   Right axillary pain which led to a mammogram and ultrasound that revealed a new mass in the superior UOQ tail of the breast 10 o'clock position by ultrasound measured 8 mm, axilla negative, biopsy revealed grade 3 IDC triple negative with a Ki-67 of 20%, T1BN0 stage Ib clinical stage   06/04/2018 Cancer Staging   Staging form: Breast, AJCC 8th Edition - Clinical stage from 06/04/2018: Stage IB (cT1b, cN0, cM0, G3, ER-, PR-, HER2-) - Signed by Nicholas Lose, MD on 06/04/2018   06/17/2018 -  Neo-Adjuvant Chemotherapy   Dose dense Adriamycin and Cytoxan x4 followed by Taxol and carboplatin   07/15/2018 Genetic Testing   No pathogenic variants identified (negative testing) on the [facility] CancerNext+RNAinsight panel. The CancerNext gene panel offered by [employer] includes sequencing and rearrangement analysis for the following 34 genes:   APC, ATM, BARD1, BMPR1A, BRCA1, BRCA2, BRIP1, CDH1, CDK4, CDKN2A, CHEK2, DICER1, EPCAM, GREM1, HOXB13MLH1, MRE11A, MSH2, MSH6, MUTYH, NBN, NF1, PALB2, PMS2, POLD1, POLE, PTEN, RAD50, RAD51D, RAD51C, SMAD4, SMARCA4, STK11, and TP53. The report date is 07/15/2018.   10/02/2018 Surgery   Right Lumpectomy ([facility]): no residual carcinoma s/p neoadjuvant treatment, 0/3 lymph nodes negative for carcinoma.    11/06/2018 - 12/22/2018 Radiation Therapy   The patient initially  received a dose of 50.4 Gy in 28 fractions to the breast using whole-breast tangent fields. This was delivered using a 3-D conformal technique. The patient then received a boost to the seroma. This delivered an additional 10 Gy in 5 fractions using a electron boost with 41mV electrons. The total dose was 60.4 Gy.     CHIEF COMPLIANT: Surveillance of triple negative right breast cancer  INTERVAL HISTORY: Katrina Liu a 60y.o. with above-mentioned history of triple negative right breast cancer who completed neoadjuvant chemotherapy, lumpectomy, radiation, and is currently on surveillance. Mammogram on 07/21/20 showed no evidence of malignancy. She presents to the clinic todayfor follow-up.    ALLERGIES:  is allergic to lisinopril.  MEDICATIONS:  Current Outpatient Medications  Medication Sig Dispense Refill  . acyclovir (ZOVIRAX) 200 MG capsule Take 1 capsule (200 mg total) by mouth 5 (five) times daily. 35 capsule 0  . Ascorbic Acid (VITAMIN C) 500 MG CAPS Take 500 mg by mouth daily.     .Marland Kitchenaspirin EC 81 MG EC tablet Take 1 tablet (81 mg total) by mouth daily. 90 tablet 0  . Biotin 10000 MCG TABS Take 10,000 mcg by mouth daily.    . Calcium Carbonate-Vitamin D (CALCIUM 500/D PO) Take by mouth.    . chlorthalidone (HYGROTON) 25 MG tablet Take 1 tablet (25 mg total) by mouth daily. 90 tablet 2  . Cyanocobalamin (VITAMIN B 12 PO) Take by mouth.    . dicyclomine (BENTYL) 10 MG capsule Take 1 capsule (10 mg total) by mouth 2 (two) times daily as needed for spasms. 30 capsule 1  . Flaxseed, Linseed, (FLAX SEED OIL) 1000  MG CAPS Take 1,000 mg by mouth once a week.    . gabapentin (NEURONTIN) 300 MG capsule Take 1 capsule (300 mg total) by mouth at bedtime. 30 capsule 3  . labetalol (NORMODYNE) 100 MG tablet Take 1 tablet (100 mg total) by mouth 2 (two) times daily. 180 tablet 3  . lidocaine (XYLOCAINE) 2 % solution Use as directed 15 mLs in the mouth or throat every 3 (three) hours as needed  for mouth pain. 200 mL 2  . magic mouthwash SOLN Take 5 mLs by mouth 4 (four) times daily as needed for mouth pain. Swish and swallow or spit 240 mL 2  . mometasone (ELOCON) 0.1 % ointment Apply 1 application topically as needed (dry skin).     . Multiple Vitamin (MULTIVITAMIN) tablet Take 1 tablet by mouth daily.    [name] nystatin (MYCOSTATIN) 100000 UNIT/ML suspension Take 5 mLs (500,000 Units total) by mouth 4 (four) times daily. 150 mL 0  . oxyCODONE (ROXICODONE) 5 MG/5ML solution 5 to 10 mg Q 4 hours prn pain 150 mL 0  . potassium chloride SA (K-DUR,KLOR-CON) 20 MEQ tablet Take 1 tablet (20 mEq total) by mouth daily. 30 tablet 2  . triamcinolone cream (KENALOG) 0.1 % Apply to hands TID prn for rash 80 g 1  . Turmeric 500 MG TABS Take 500 mg by mouth daily.    [name] zinc gluconate 50 MG tablet Take 50 mg by mouth daily.     No current facility-administered medications for this visit.    PHYSICAL EXAMINATION: ECOG PERFORMANCE STATUS: 1 - Symptomatic but completely ambulatory  There were no vitals filed for this visit. There were no vitals filed for this visit.  BREAST: No palpable masses or nodules in either right or left breasts. No palpable axillary supraclavicular or infraclavicular adenopathy no breast tenderness or nipple discharge. (exam performed in the presence of a chaperone)  LABORATORY DATA:  I have reviewed the data as listed CMP Latest Ref Rng & Units 09/29/2018 09/03/2018 08/25/2018  Glucose 70 - 99 mg/dL 91 110(H) 96  BUN 6 - 20 mg/dL '12 9 10  ' Creatinine 0.44 - 1.00 mg/dL 0.93 0.82 0.85  Sodium 135 - 145 mmol/L 141 138 139  Potassium 3.5 - 5.1 mmol/L 3.8 4.0 3.9  Chloride 98 - 111 mmol/L 104 105 106  CO2 22 - 32 mmol/L '26 25 23  ' Calcium 8.9 - 10.3 mg/dL 9.9 9.1 9.2  Total Protein 6.5 - 8.1 g/dL 6.8 6.4(L) 6.7  Total Bilirubin 0.3 - 1.2 mg/dL 0.8 0.3 0.4  Alkaline Phos 38 - 126 U/L 77 68 80  AST 15 - 41 U/L 22 13(L) 46(H)  ALT 0 - 44 U/L 18 14 57(H)    Lab Results   Component Value Date   WBC 2.9 (L) 09/29/2018   HGB 11.9 (L) 09/29/2018   HCT 36.7 09/29/2018   MCV 94.8 09/29/2018   PLT 261 09/29/2018   NEUTROABS 1.4 (L) 09/29/2018    ASSESSMENT & PLAN:  Malignant neoplasm of upper-outer quadrant of right breast in female, estrogen receptor negative ([facility]) 05/29/2018:Right axillary pain which led to a mammogram and ultrasound that revealed a new mass in the superior UOQ tail of the breast 10 o'clock position by ultrasound measured 8 mm, axilla negative, 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 Neoadjuvant chemotherapy with Adriamycin and Cytoxan dose dense 4 followed byTaxolweekly 2and carboplatin every 3 weeks (  Discontinued due to toxicities) 2. 10/02/2018:Right Lumpectomy ([facility]): no residual carcinoma s/p neoadjuvant treatment, 0/3 lymph nodes negative for carcinoma.(Path CR) 3. Followed by adjuvant radiation therapy completed 12/22/2018 Geneticsnegative ---------------------------------------------------------------------------------------------------------------------------------- Breast Cancer Surveillance:  1. Mammograms 07/21/20: Benign (December 2021: Biopsy of the right breast nodule: Fat necrosis) 2. Breast Exam:08/08/20: Benign Bone density to be done 07/10/2019 as well  Patient continues to have minor adverse effects including Mild peripheral neuropathy: Persistent    Her grandson is now 46 months old and they are moving to [facility] and she is very happy about that.  Survivorship: She is walking 10,000 steps per day and staying active.  RTC in 1 year    No orders of the defined types were placed in this encounter.  The patient has a good understanding of the overall plan. she agrees with it. she will call with any problems that may develop before the next visit here.  Total time spent: 20 mins including face to face time and time  spent for planning, charting and coordination of care  Rulon Eisenmenger, MD, MPH 08/08/2020  I, Molly Dorshimer, am acting as scribe for Dr. Nicholas Lose.  I have reviewed the above documentation for accuracy and completeness, and I agree with the above.

## 2020-08-07 NOTE — Assessment & Plan Note (Signed)
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 Neoadjuvant chemotherapy with Adriamycin and Cytoxan dose dense 4 followed byTaxolweekly 2and carboplatin every 3 weeks ( Discontinued due to toxicities) 2. 10/02/2018:Right Lumpectomy ([facility]): no residual carcinoma s/p neoadjuvant treatment, 0/3 lymph nodes negative for carcinoma.(Path CR) 3. Followed by adjuvant radiation therapy completed 12/22/2018 Geneticsnegative ---------------------------------------------------------------------------------------------------------------------------------- Breast Cancer Surveillance:  1. Mammograms 07/21/20: Benign 2. Breast Exam:08/08/20: Benign Bone density to be done 07/10/2019 as well  Patient continues to have minor adverse effects including Brittle nails Memory issues Mild peripheral neuropathy  Patient plans to drive from [place] to [place] for her 59th birthday. She is expecting her first grandchild in June 2021.  Survivorship: I encouraged her to continue to stay active exercise regularly and eat healthy with less carbohydrates.  She tells me that she stopped eating chips.  RTC in 1 year

## 2020-08-08 ENCOUNTER — Inpatient Hospital Stay: Payer: Managed Care, Other (non HMO) | Attending: Hematology and Oncology | Admitting: Hematology and Oncology

## 2020-08-08 ENCOUNTER — Other Ambulatory Visit: Payer: Self-pay

## 2020-08-08 DIAGNOSIS — Z9221 Personal history of antineoplastic chemotherapy: Secondary | ICD-10-CM | POA: Diagnosis not present

## 2020-08-08 DIAGNOSIS — Z79899 Other long term (current) drug therapy: Secondary | ICD-10-CM | POA: Insufficient documentation

## 2020-08-08 DIAGNOSIS — Z171 Estrogen receptor negative status [ER-]: Secondary | ICD-10-CM | POA: Insufficient documentation

## 2020-08-08 DIAGNOSIS — Z923 Personal history of irradiation: Secondary | ICD-10-CM | POA: Diagnosis not present

## 2020-08-08 DIAGNOSIS — Z7982 Long term (current) use of aspirin: Secondary | ICD-10-CM | POA: Diagnosis not present

## 2020-08-08 DIAGNOSIS — Z7951 Long term (current) use of inhaled steroids: Secondary | ICD-10-CM | POA: Insufficient documentation

## 2020-08-08 DIAGNOSIS — C50411 Malignant neoplasm of upper-outer quadrant of right female breast: Secondary | ICD-10-CM | POA: Diagnosis not present

## 2020-08-16 ENCOUNTER — Other Ambulatory Visit: Payer: Managed Care, Other (non HMO)

## 2020-08-31 ENCOUNTER — Other Ambulatory Visit: Payer: Managed Care, Other (non HMO)

## 2020-10-24 ENCOUNTER — Encounter: Payer: Self-pay | Admitting: Nurse Practitioner

## 2020-10-24 ENCOUNTER — Other Ambulatory Visit: Payer: Self-pay

## 2020-10-24 ENCOUNTER — Ambulatory Visit (INDEPENDENT_AMBULATORY_CARE_PROVIDER_SITE_OTHER): Payer: Managed Care, Other (non HMO) | Admitting: Nurse Practitioner

## 2020-10-24 VITALS — BP 124/70 | HR 67 | Temp 97.8°F | Ht 65.6 in | Wt 138.2 lb

## 2020-10-24 DIAGNOSIS — E7800 Pure hypercholesterolemia, unspecified: Secondary | ICD-10-CM | POA: Diagnosis not present

## 2020-10-24 DIAGNOSIS — G620 Drug-induced polyneuropathy: Secondary | ICD-10-CM | POA: Diagnosis not present

## 2020-10-24 DIAGNOSIS — I10 Essential (primary) hypertension: Secondary | ICD-10-CM | POA: Diagnosis not present

## 2020-10-24 DIAGNOSIS — E042 Nontoxic multinodular goiter: Secondary | ICD-10-CM | POA: Diagnosis not present

## 2020-10-24 DIAGNOSIS — Z7689 Persons encountering health services in other specified circumstances: Secondary | ICD-10-CM

## 2020-10-24 DIAGNOSIS — Z8673 Personal history of transient ischemic attack (TIA), and cerebral infarction without residual deficits: Secondary | ICD-10-CM

## 2020-10-24 DIAGNOSIS — Z8616 Personal history of COVID-19: Secondary | ICD-10-CM

## 2020-10-24 NOTE — Progress Notes (Signed)
I,Yamilka Roman [employer] as a Education administrator for [employer], FNP.,have documented all relevant documentation on the behalf of [name], FNP,as directed by  [name], FNP while in the presence of [name], [place].  This visit occurred during the SARS-CoV-2 public health emergency.  Safety protocols were in place, including screening questions prior to the visit, additional usage of staff PPE, and extensive cleaning of exam room while observing appropriate contact time as indicated for disinfecting solutions.  Subjective:     Patient ID: Katrina Liu , female    DOB: [birth year] , 60 y.o.   MRN: [id number]   Chief Complaint  Patient presents with   Establish Care   bloodwork concerns    Patient  stated she was concerned about her blood work her previous provider told her she needed to start a statin medication due to her cholesterol levels being elevated. She stated her levels have improved and she is unsure why that provider was wanting her to start the medication.    HPI  Patient presents today to establish primary care. She has not switched providers at this time she is going to [employer] at [employer].  She works for the [employer] as a Nurse, learning disability. Divorced. She has one child - daughter who lives in [place] with ond grandson.    She has some concerns about her bloodwork that was done by her previous provider.   She had chemo and radiation 2020 right breast cancer - chemo stopped early had about 7/12 due to side effects.  Completed the radiation. She is followed by Dr. Lindi Adie - last appt in April.   PMH - migraines prior to her hysterectomy. Stroke - 12/21/2013 - she has been released from the neurologist. Continues taking a baby ASA.   She had covid in May 2022 - had inhaler and is doing well.   She is concerned about starting a statin from her primary care. One year ago was increased, most recent was improved.   She reports having tingling to her  feet when walking. She continues to have soreness to her right breast where he had the lumpectemy.        Past Medical History:  Diagnosis Date   Anemia    Breast cancer ([place]) 10/02/2018   right breast lumpectomy   Family history of prostate cancer    GERD (gastroesophageal reflux disease)    h/o - not now   Headache(784.0)    Hypertension    Personal history of chemotherapy    Personal history of radiation therapy    S/P abdominal hysterectomy 04/17/2011   Stroke ([place])    no residual     Family History  Problem Relation Age of Onset   Cancer Father    Stroke Father 1   Lung cancer Father    Prostate cancer Paternal Uncle        dx 25's?   Diabetes Maternal Grandfather    Breast cancer Neg Hx      Current Outpatient Medications:    aspirin EC 81 MG EC tablet, Take 1 tablet (81 mg total) by mouth daily., Disp: 90 tablet, Rfl: 0   chlorthalidone (HYGROTON) 25 MG tablet, Take 1 tablet (25 mg total) by mouth daily., Disp: 90 tablet, Rfl: 2   labetalol (NORMODYNE) 100 MG tablet, Take 1 tablet (100 mg total) by mouth 2 (two) times daily., Disp: 180 tablet, Rfl: 3   mometasone (ELOCON) 0.1 % ointment, Apply 1 application topically as needed (  dry skin). , Disp: , Rfl:    potassium chloride SA (K-DUR,KLOR-CON) 20 MEQ tablet, Take 1 tablet (20 mEq total) by mouth daily., Disp: 30 tablet, Rfl: 2   Allergies  Allergen Reactions   Lisinopril Swelling     Review of Systems  Constitutional: Negative.   Respiratory: Negative.  Negative for cough and shortness of breath.   Cardiovascular: Negative.  Negative for chest pain, palpitations and leg swelling.  Neurological: Negative.  Negative for dizziness and headaches.  Psychiatric/Behavioral: Negative.      Today's Vitals   10/24/20 1010  BP: 124/70  Pulse: 67  Temp: 97.8 F (36.6 C)  SpO2: 98%  Weight: 138 lb 3.2 oz (62.7 kg)  Height: 5' 5.6" (1.666 m)  PainSc: 0-No pain   Body mass index is 22.58 kg/m.   Objective:   Physical Exam Vitals reviewed.  Constitutional:      General: She is not in acute distress.    Appearance: Normal appearance.  Cardiovascular:     Pulses: Normal pulses.     Heart sounds: Normal heart sounds. No murmur heard. Pulmonary:     Effort: Pulmonary effort is normal. No respiratory distress.     Breath sounds: Normal breath sounds. No wheezing.  Neurological:     General: No focal deficit present.     Mental Status: She is alert.  Psychiatric:        Mood and Affect: Mood normal.        Behavior: Behavior normal.        Thought Content: Thought content normal.        Judgment: Judgment normal.        Assessment And Plan:     1. Primary hypertension Comments: Blood pressure is well controlled - CMP14+EGFR  2. Drug-induced polyneuropathy (HCC)  3. Elevated cholesterol Comments: Will check statin No current medications, will check lipid panel to determine if need medication - Lipid panel  4. Multinodular goiter  5. History of COVID-19  6. History of stroke  7. Establishing care with new doctor, encounter for     Patient was given opportunity to ask questions. Patient verbalized understanding of the plan and was able to repeat key elements of the plan. All questions were answered to their satisfaction.  [name], FNP   I, [name], FNP, have reviewed all documentation for this visit. The documentation on 11/23/20 for the exam, diagnosis, procedures, and orders are all accurate and complete.   IF YOU HAVE BEEN REFERRED TO A SPECIALIST, IT MAY TAKE 1-2 WEEKS TO SCHEDULE/PROCESS THE REFERRAL. IF YOU HAVE NOT HEARD FROM US/SPECIALIST IN TWO WEEKS, PLEASE GIVE [place] A CALL AT [phone].   THE PATIENT IS ENCOURAGED TO PRACTICE SOCIAL DISTANCING DUE TO THE COVID-19 PANDEMIC.

## 2020-10-25 LAB — CMP14+EGFR
ALT: 12 IU/L (ref 0–32)
AST: 19 IU/L (ref 0–40)
Albumin/Globulin Ratio: 2.1 (ref 1.2–2.2)
Albumin: 4.8 g/dL (ref 3.8–4.9)
Alkaline Phosphatase: 93 IU/L (ref 44–121)
BUN/Creatinine Ratio: 15 (ref 12–28)
BUN: 14 mg/dL (ref 8–27)
Bilirubin Total: 0.6 mg/dL (ref 0.0–1.2)
CO2: 28 mmol/L (ref 20–29)
Calcium: 10.3 mg/dL (ref 8.7–10.3)
Chloride: 100 mmol/L (ref 96–106)
Creatinine, Ser: 0.94 mg/dL (ref 0.57–1.00)
Globulin, Total: 2.3 g/dL (ref 1.5–4.5)
Glucose: 91 mg/dL (ref 65–99)
Potassium: 3.8 mmol/L (ref 3.5–5.2)
Sodium: 142 mmol/L (ref 134–144)
Total Protein: 7.1 g/dL (ref 6.0–8.5)
eGFR: 69 mL/min/{1.73_m2} (ref >59)

## 2020-10-25 LAB — LIPID PANEL
Chol/HDL Ratio: 2.9 ratio (ref 0.0–4.4)
Cholesterol, Total: 241 mg/dL — ABNORMAL HIGH (ref 100–199)
HDL: 83 mg/dL (ref >39)
LDL Chol Calc (NIH): 147 mg/dL — ABNORMAL HIGH (ref 0–99)
Triglycerides: 68 mg/dL (ref 0–149)
VLDL Cholesterol Cal: 11 mg/dL (ref 5–40)

## 2020-10-26 DIAGNOSIS — E042 Nontoxic multinodular goiter: Secondary | ICD-10-CM | POA: Insufficient documentation

## 2020-11-01 ENCOUNTER — Encounter: Payer: Self-pay | Admitting: Nurse Practitioner

## 2021-01-30 ENCOUNTER — Encounter: Payer: Self-pay | Admitting: Nurse Practitioner

## 2021-01-30 ENCOUNTER — Ambulatory Visit (INDEPENDENT_AMBULATORY_CARE_PROVIDER_SITE_OTHER): Payer: Managed Care, Other (non HMO) | Admitting: Nurse Practitioner

## 2021-01-30 ENCOUNTER — Other Ambulatory Visit: Payer: Self-pay

## 2021-01-30 VITALS — BP 126/88 | HR 78 | Temp 98.5°F | Ht 65.6 in | Wt 137.0 lb

## 2021-01-30 DIAGNOSIS — E7800 Pure hypercholesterolemia, unspecified: Secondary | ICD-10-CM

## 2021-01-30 DIAGNOSIS — Z1159 Encounter for screening for other viral diseases: Secondary | ICD-10-CM

## 2021-01-30 DIAGNOSIS — I10 Essential (primary) hypertension: Secondary | ICD-10-CM

## 2021-01-30 NOTE — Patient Instructions (Signed)
Mediterranean Diet A Mediterranean diet refers to food and lifestyle choices that are based on the traditions of countries located on the [place] Sea. This way of eating has been shown to help prevent certain conditions and improve outcomes for people who have chronic diseases, like kidney disease and heart disease. What are tips for following this plan? Lifestyle  Cook and eat meals together with your family, when possible.  Drink enough fluid to keep your urine clear or pale yellow.  Be physically active every day. This includes: ? Aerobic exercise like running or swimming. ? Leisure activities like gardening, walking, or housework.  Get 7-8 hours of sleep each night.  If recommended by your health care provider, drink red wine in moderation. This means 1 glass a day for nonpregnant women and 2 glasses a day for men. A glass of wine equals 5 oz (150 mL). Reading food labels  Check the serving size of packaged foods. For foods such as rice and pasta, the serving size refers to the amount of cooked product, not dry.  Check the total fat in packaged foods. Avoid foods that have saturated fat or trans fats.  Check the ingredients list for added sugars, such as corn syrup.   Shopping  At the grocery store, buy most of your food from the areas near the walls of the store. This includes: ? Fresh fruits and vegetables (produce). ? Grains, beans, nuts, and seeds. Some of these may be available in unpackaged forms or large amounts (in bulk). ? Fresh seafood. ? Poultry and eggs. ? Low-fat dairy products.  Buy whole ingredients instead of prepackaged foods.  Buy fresh fruits and vegetables in-season from local farmers markets.  Buy frozen fruits and vegetables in resealable bags.  If you do not have access to quality fresh seafood, buy precooked frozen shrimp or canned fish, such as tuna, salmon, or sardines.  Buy small amounts of raw or cooked vegetables, salads, or olives from  the deli or salad bar at your store.  Stock your pantry so you always have certain foods on hand, such as olive oil, canned tuna, canned tomatoes, rice, pasta, and beans. Cooking  Cook foods with extra-virgin olive oil instead of using butter or other vegetable oils.  Have meat as a side dish, and have vegetables or grains as your main dish. This means having meat in small portions or adding small amounts of meat to foods like pasta or stew.  Use beans or vegetables instead of meat in common dishes like chili or lasagna.  Experiment with different cooking methods. Try roasting or broiling vegetables instead of steaming or sauteing them.  Add frozen vegetables to soups, stews, pasta, or rice.  Add nuts or seeds for added healthy fat at each meal. You can add these to yogurt, salads, or vegetable dishes.  Marinate fish or vegetables using olive oil, lemon juice, garlic, and fresh herbs. Meal planning  Plan to eat 1 vegetarian meal one day each week. Try to work up to 2 vegetarian meals, if possible.  Eat seafood 2 or more times a week.  Have healthy snacks readily available, such as: ? Vegetable sticks with hummus. ? Greek yogurt. ? Fruit and nut trail mix.  Eat balanced meals throughout the week. This includes: ? Fruit: 2-3 servings a day ? Vegetables: 4-5 servings a day ? Low-fat dairy: 2 servings a day ? Fish, poultry, or lean meat: 1 serving a day ? Beans and legumes: 2 or more servings a week ?   Nuts and seeds: 1-2 servings a day ? Whole grains: 6-8 servings a day ? Extra-virgin olive oil: 3-4 servings a day  Limit red meat and sweets to only a few servings a month   What are my food choices?  Mediterranean diet ? Recommended  Grains: Whole-grain pasta. Brown rice. Bulgar wheat. Polenta. Couscous. Whole-wheat bread. Oatmeal. Quinoa.  Vegetables: Artichokes. Beets. Broccoli. Cabbage. Carrots. Eggplant. Green beans. Chard. Kale. Spinach. Onions. Leeks. Peas. Squash.  Tomatoes. Peppers. Radishes.  Fruits: Apples. Apricots. Avocado. Berries. Bananas. Cherries. Dates. Figs. Grapes. Lemons. Melon. Oranges. Peaches. Plums. Pomegranate.  Meats and other protein foods: Beans. Almonds. Sunflower seeds. Pine nuts. Peanuts. Cod. Salmon. Scallops. Shrimp. Tuna. Tilapia. Clams. Oysters. Eggs.  Dairy: Low-fat milk. Cheese. Greek yogurt.  Beverages: Water. Red wine. Herbal tea.  Fats and oils: Extra virgin olive oil. Avocado oil. Grape seed oil.  Sweets and desserts: Greek yogurt with honey. Baked apples. Poached pears. Trail mix.  Seasoning and other foods: Basil. Cilantro. Coriander. Cumin. Mint. Parsley. Sage. Rosemary. Tarragon. Garlic. Oregano. Thyme. Pepper. Balsalmic vinegar. Tahini. Hummus. Tomato sauce. Olives. Mushrooms. ? Limit these  Grains: Prepackaged pasta or rice dishes. Prepackaged cereal with added sugar.  Vegetables: Deep fried potatoes (french fries).  Fruits: Fruit canned in syrup.  Meats and other protein foods: Beef. Pork. Lamb. Poultry with skin. Hot dogs. Bacon.  Dairy: Ice cream. Sour cream. Whole milk.  Beverages: Juice. Sugar-sweetened soft drinks. Beer. Liquor and spirits.  Fats and oils: Butter. Canola oil. Vegetable oil. Beef fat (tallow). Lard.  Sweets and desserts: Cookies. Cakes. Pies. Candy.  Seasoning and other foods: Mayonnaise. Premade sauces and marinades. The items listed may not be a complete list. Talk with your dietitian about what dietary choices are right for you. Summary  The Mediterranean diet includes both food and lifestyle choices.  Eat a variety of fresh fruits and vegetables, beans, nuts, seeds, and whole grains.  Limit the amount of red meat and sweets that you eat.  Talk with your health care provider about whether it is safe for you to drink red wine in moderation. This means 1 glass a day for nonpregnant women and 2 glasses a day for men. A glass of wine equals 5 oz (150 mL). This information  is not intended to replace advice given to you by your health care provider. Make sure you discuss any questions you have with your health care provider. Document Revised: 12/15/2015 Document Reviewed: 12/08/2015 Elsevier Patient Education  2020 Elsevier Inc.  

## 2021-01-30 NOTE — Progress Notes (Signed)
I,Katawbba Wiggins,acting as a Education administrator for [employer], FNP.,have documented all relevant documentation on the behalf of [name], FNP,as directed by  [name], FNP while in the presence of [name], [place].   This visit occurred during the SARS-CoV-2 public health emergency.  Safety protocols were in place, including screening questions prior to the visit, additional usage of staff PPE, and extensive cleaning of exam room while observing appropriate contact time as indicated for disinfecting solutions.  Subjective:     Patient ID: Katrina Liu , female    DOB: August 21, 1960 , 60 y.o.   MRN: [id number]   Chief Complaint  Patient presents with   Hypertension   Hyperlipidemia    HPI  The patient is here for a blood pressure and cholesterol f/u. She has not taken any of her blood pressure medications today. She is seeing a chiropractor for her right lower back and sciatic, she is being seen at [facility] for the next 6 weeks at least. She has cut out the potato chips. She is taking benefiber regularly. "I have been working really hard on my diet".   She has been seing the surgeon for the fat necrosis to her left lateral upper chest.   Hypertension This is a chronic problem. The current episode started more than 1 year ago. The problem is controlled. Pertinent negatives include no anxiety, headaches or shortness of breath. Past treatments include diuretics.    Past Medical History:  Diagnosis Date   Anemia    Breast cancer ([place]) 10/02/2018   right breast lumpectomy   Family history of prostate cancer    GERD (gastroesophageal reflux disease)    h/o - not now   Headache(784.0)    Hypertension    Personal history of chemotherapy    Personal history of radiation therapy    S/P abdominal hysterectomy 04/17/2011   Stroke ([place])    no residual     Family History  Problem Relation Age of Onset   Cancer Father    Stroke Father 66   Lung cancer Father    Prostate  cancer Paternal Uncle        dx 61's?   Diabetes Maternal Grandfather    Breast cancer Neg Hx      Current Outpatient Medications:    aspirin EC 81 MG EC tablet, Take 1 tablet (81 mg total) by mouth daily., Disp: 90 tablet, Rfl: 0   chlorthalidone (HYGROTON) 25 MG tablet, Take 1 tablet (25 mg total) by mouth daily., Disp: 90 tablet, Rfl: 2   diclofenac Sodium (VOLTAREN) 1 % GEL, Apply 1 application topically 4 (four) times daily., Disp: , Rfl:    labetalol (NORMODYNE) 100 MG tablet, Take 1 tablet (100 mg total) by mouth 2 (two) times daily., Disp: 180 tablet, Rfl: 3   mometasone (ELOCON) 0.1 % ointment, Apply 1 application topically as needed (dry skin). , Disp: , Rfl:    potassium chloride SA (K-DUR,KLOR-CON) 20 MEQ tablet, Take 1 tablet (20 mEq total) by mouth daily., Disp: 30 tablet, Rfl: 2   Allergies  Allergen Reactions   Lisinopril Swelling     Review of Systems  Constitutional: Negative.   Respiratory: Negative.  Negative for shortness of breath.   Cardiovascular: Negative.   Gastrointestinal: Negative.   Neurological:  Negative for dizziness and headaches.  Psychiatric/Behavioral: Negative.    All other systems reviewed and are negative.   Today's Vitals   01/30/21 1014  BP: 126/88  Pulse: 78  Temp: 98.5  F (36.9 C)  Weight: 137 lb (62.1 kg)  Height: 5' 5.6" (1.666 m)   Body mass index is 22.38 kg/m.  Wt Readings from Last 3 Encounters:  01/30/21 137 lb (62.1 kg)  10/24/20 138 lb 3.2 oz (62.7 kg)  08/08/20 140 lb 3.2 oz (63.6 kg)    BP Readings from Last 3 Encounters:  01/30/21 126/88  10/24/20 124/70  08/08/20 (!) 156/89    Objective:  Physical Exam Vitals reviewed.  Constitutional:      General: She is not in acute distress.    Appearance: Normal appearance.  Cardiovascular:     Pulses: Normal pulses.     Heart sounds: Normal heart sounds. No murmur heard. Pulmonary:     Effort: Pulmonary effort is normal. No respiratory distress.     Breath  sounds: Normal breath sounds. No wheezing.  Skin:    General: Skin is warm and dry.     Capillary Refill: Capillary refill takes less than 2 seconds.  Neurological:     General: No focal deficit present.     Mental Status: She is alert.     Cranial Nerves: No cranial nerve deficit.     Motor: No weakness.  Psychiatric:        Mood and Affect: Mood normal.        Behavior: Behavior normal.        Thought Content: Thought content normal.        Judgment: Judgment normal.        Assessment And Plan:     1. Primary hypertension Comments: Blood pressure is slightly elevated, has not taken medications today No changes - BMP8+EGFR  2. Elevated cholesterol Comments: Cholesterol levels were elevated at last visit, will recheck lipids today Continue with low fat diet - Lipid panel  3. Encounter for hepatitis C screening test for low risk patient Will check Hepatitis C screening due to recent recommendations to screen all adults 18 years and older - Hepatitis C antibody    Patient was given opportunity to ask questions. Patient verbalized understanding of the plan and was able to repeat key elements of the plan. All questions were answered to their satisfaction.  [name], FNP   I, [name], FNP, have reviewed all documentation for this visit. The documentation on 01/30/21 for the exam, diagnosis, procedures, and orders are all accurate and complete.   IF YOU HAVE BEEN REFERRED TO A SPECIALIST, IT MAY TAKE 1-2 WEEKS TO SCHEDULE/PROCESS THE REFERRAL. IF YOU HAVE NOT HEARD FROM US/SPECIALIST IN TWO WEEKS, PLEASE GIVE [place] A CALL AT [phone].   THE PATIENT IS ENCOURAGED TO PRACTICE SOCIAL DISTANCING DUE TO THE COVID-19 PANDEMIC.

## 2021-01-31 LAB — LIPID PANEL
Chol/HDL Ratio: 2.6 ratio (ref 0.0–4.4)
Cholesterol, Total: 219 mg/dL — ABNORMAL HIGH (ref 100–199)
HDL: 83 mg/dL (ref >39)
LDL Chol Calc (NIH): 125 mg/dL — ABNORMAL HIGH (ref 0–99)
Triglycerides: 65 mg/dL (ref 0–149)
VLDL Cholesterol Cal: 11 mg/dL (ref 5–40)

## 2021-01-31 LAB — BMP8+EGFR
BUN/Creatinine Ratio: 15 (ref 12–28)
BUN: 14 mg/dL (ref 8–27)
CO2: 28 mmol/L (ref 20–29)
Calcium: 10.1 mg/dL (ref 8.7–10.3)
Chloride: 99 mmol/L (ref 96–106)
Creatinine, Ser: 0.92 mg/dL (ref 0.57–1.00)
Glucose: 90 mg/dL (ref 70–99)
Potassium: 3.7 mmol/L (ref 3.5–5.2)
Sodium: 142 mmol/L (ref 134–144)
eGFR: 71 mL/min/{1.73_m2} (ref >59)

## 2021-01-31 LAB — HEPATITIS C ANTIBODY: Hep C Virus Ab: <0.1 s/co ratio (ref 0.0–0.9)

## 2021-02-09 ENCOUNTER — Other Ambulatory Visit: Payer: Self-pay

## 2021-02-09 ENCOUNTER — Ambulatory Visit: Payer: Managed Care, Other (non HMO) | Admitting: Podiatry

## 2021-02-09 ENCOUNTER — Ambulatory Visit (INDEPENDENT_AMBULATORY_CARE_PROVIDER_SITE_OTHER): Payer: Managed Care, Other (non HMO)

## 2021-02-09 ENCOUNTER — Encounter: Payer: Self-pay | Admitting: Podiatry

## 2021-02-09 DIAGNOSIS — M722 Plantar fascial fibromatosis: Secondary | ICD-10-CM

## 2021-02-09 NOTE — Progress Notes (Signed)
Subjective:   Patient ID: Katrina Liu, female   DOB: 60 y.o.   MRN: [id number]   HPI Patient presents stating that she has a knot in the bottom of her right arch x2 months.  States is not painful but she is had history of cancer and was just concerned   Review of Systems  All other systems reviewed and are negative.      Objective:  Physical Exam Vitals and nursing note reviewed.  Constitutional:      Appearance: She is well-developed.  Pulmonary:     Effort: Pulmonary effort is normal.  Musculoskeletal:        General: Normal range of motion.  Skin:    General: Skin is warm.  Neurological:     Mental Status: She is alert.    Neurovascular status intact muscle strength found to be adequate range of motion adequate.  In the distal portion of the plantar fascial right there is a nodule that measures approximately 8 mm x 8 mm and appears to be within the plantar fascia itself no indications that there is any spread in any other direction and it appears to be very fibrous.  Good digital perfusion well oriented x3     Assessment:  Probability for a plantar fibroma right given its lack of pain size and where it is within the fascia itself     Plan:  H&P reviewed conditions and did discuss excision in future but since it is nonpainful we will put it on a watch and if it were to grow in size change color or become painful may require excision and patient is comfortable with this at this time understanding that we cannot 100% say what it is  X-rays were negative for calcification or any bony pathology associated with this

## 2021-03-13 ENCOUNTER — Other Ambulatory Visit: Payer: Self-pay | Admitting: Internal Medicine

## 2021-03-13 DIAGNOSIS — E042 Nontoxic multinodular goiter: Secondary | ICD-10-CM

## 2021-03-29 ENCOUNTER — Ambulatory Visit
Admission: RE | Admit: 2021-03-29 | Discharge: 2021-03-29 | Disposition: A | Discharge status: Home / Self Care | Payer: Managed Care, Other (non HMO) | Source: Ambulatory Visit | Attending: Internal Medicine | Admitting: Internal Medicine

## 2021-03-29 DIAGNOSIS — E042 Nontoxic multinodular goiter: Secondary | ICD-10-CM

## 2021-06-13 ENCOUNTER — Other Ambulatory Visit: Payer: Self-pay | Admitting: Hematology and Oncology

## 2021-06-13 DIAGNOSIS — Z853 Personal history of malignant neoplasm of breast: Secondary | ICD-10-CM

## 2021-07-05 ENCOUNTER — Ambulatory Visit (INDEPENDENT_AMBULATORY_CARE_PROVIDER_SITE_OTHER): Payer: Managed Care, Other (non HMO) | Admitting: Nurse Practitioner

## 2021-07-05 ENCOUNTER — Encounter: Payer: Self-pay | Admitting: Nurse Practitioner

## 2021-07-05 ENCOUNTER — Other Ambulatory Visit: Payer: Self-pay

## 2021-07-05 VITALS — BP 128/70 | HR 63 | Temp 97.5°F | Ht 65.6 in | Wt 138.6 lb

## 2021-07-05 DIAGNOSIS — Z4509 Encounter for adjustment and management of other cardiac device: Secondary | ICD-10-CM

## 2021-07-05 DIAGNOSIS — I639 Cerebral infarction, unspecified: Secondary | ICD-10-CM

## 2021-07-05 DIAGNOSIS — E7800 Pure hypercholesterolemia, unspecified: Secondary | ICD-10-CM | POA: Diagnosis not present

## 2021-07-05 DIAGNOSIS — E876 Hypokalemia: Secondary | ICD-10-CM

## 2021-07-05 DIAGNOSIS — Z0000 Encounter for general adult medical examination without abnormal findings: Secondary | ICD-10-CM

## 2021-07-05 DIAGNOSIS — Z853 Personal history of malignant neoplasm of breast: Secondary | ICD-10-CM

## 2021-07-05 DIAGNOSIS — Z171 Estrogen receptor negative status [ER-]: Secondary | ICD-10-CM

## 2021-07-05 DIAGNOSIS — I10 Essential (primary) hypertension: Secondary | ICD-10-CM | POA: Diagnosis not present

## 2021-07-05 DIAGNOSIS — Z79899 Other long term (current) drug therapy: Secondary | ICD-10-CM

## 2021-07-05 DIAGNOSIS — C50411 Malignant neoplasm of upper-outer quadrant of right female breast: Secondary | ICD-10-CM

## 2021-07-05 LAB — POCT URINALYSIS DIPSTICK
Bilirubin, UA: NEGATIVE
Glucose, UA: NEGATIVE
Ketones, UA: NEGATIVE
Leukocytes, UA: NEGATIVE
Nitrite, UA: NEGATIVE
Protein, UA: NEGATIVE
Spec Grav, UA: 1.01 (ref 1.010–1.025)
Urobilinogen, UA: 0.2 E.U./dL
pH, UA: 6 (ref 5.0–8.0)

## 2021-07-05 MED ORDER — LABETALOL HCL 100 MG PO TABS: 100.0000 mg | ORAL_TABLET | Freq: Two times a day (BID) | ORAL | 1 refills | Status: DC

## 2021-07-05 MED ORDER — POTASSIUM CHLORIDE CRYS ER 20 MEQ PO TBCR: 20.0000 meq | EXTENDED_RELEASE_TABLET | Freq: Every day | ORAL | 1 refills | Status: DC

## 2021-07-05 MED ORDER — CHLORTHALIDONE 25 MG PO TABS: 25.0000 mg | ORAL_TABLET | Freq: Every day | ORAL | 1 refills | Status: DC

## 2021-07-05 NOTE — Progress Notes (Unsigned)
I,[name],acting as a Education administrator for [employer], FNP.,have documented all relevant documentation on the behalf of [name], FNP,as directed by  [name], FNP while in the presence of [name], [place].  This visit occurred during the SARS-CoV-2 public health emergency.  Safety protocols were in place, including screening questions prior to the visit, additional usage of staff PPE, and extensive cleaning of exam room while observing appropriate contact time as indicated for disinfecting solutions.  Subjective:     Patient ID: Katrina Liu , female    DOB: 19-Jul-1960 , 61 y.o.   MRN: [id number]   Chief Complaint  Patient presents with   Annual Exam    HPI  The patient is here for physical exam. She has fat necrosis to her right breast. She has had her goiter checked yearly. She is to have her mammogram this month and the Oncologist next month.   Wt Readings from Last 3 Encounters: 07/05/21 : 138 lb 9.6 oz (62.9 kg) 01/30/21 : 137 lb (62.1 kg) 10/24/20 : 138 lb 3.2 oz (62.7 kg)    Hypertension This is a chronic problem. The current episode started more than 1 year ago. The problem is controlled. Pertinent negatives include no anxiety, headaches or shortness of breath. Past treatments include diuretics.    Past Medical History:  Diagnosis Date   Anemia    Breast cancer ([place]) 10/02/2018   right breast lumpectomy   Family history of prostate cancer    GERD (gastroesophageal reflux disease)    h/o - not now   Headache(784.0)    Hypertension    Personal history of chemotherapy    Personal history of radiation therapy    S/P abdominal hysterectomy 04/17/2011   Stroke ([place])    no residual     Family History  Problem Relation Age of Onset   Cancer Father    Stroke Father 72   Lung cancer Father    Prostate cancer Paternal Uncle        dx 100's?   Diabetes Maternal Grandfather    Breast cancer Neg Hx      Current Outpatient Medications:    aspirin EC 81 MG  EC tablet, Take 1 tablet (81 mg total) by mouth daily., Disp: 90 tablet, Rfl: 0   chlorthalidone (HYGROTON) 25 MG tablet, Take 1 tablet (25 mg total) by mouth daily., Disp: 90 tablet, Rfl: 2   diclofenac Sodium (VOLTAREN) 1 % GEL, Apply 1 application topically 4 (four) times daily., Disp: , Rfl:    labetalol (NORMODYNE) 100 MG tablet, Take 1 tablet (100 mg total) by mouth 2 (two) times daily., Disp: 180 tablet, Rfl: 3   mometasone (ELOCON) 0.1 % ointment, Apply 1 application topically as needed (dry skin). , Disp: , Rfl:    potassium chloride SA (K-DUR,KLOR-CON) 20 MEQ tablet, Take 1 tablet (20 mEq total) by mouth daily., Disp: 30 tablet, Rfl: 2   Allergies  Allergen Reactions   Lisinopril Swelling      The patient states she uses {contraceptive methods:5051} for birth control. Last LMP was Patient's last menstrual period was 04/16/2011.. {Dysmenorrhea-menorrhagia:21918}. Negative for: breast discharge, breast lump(s), breast pain and breast self exam. Associated symptoms include abnormal vaginal bleeding. Pertinent negatives include abnormal bleeding (hematology), anxiety, decreased libido, depression, difficulty falling sleep, dyspareunia, history of infertility, nocturia, sexual dysfunction, sleep disturbances, urinary incontinence, urinary urgency, vaginal discharge and vaginal itching. Diet regular. She is trying to eat less chips and fructose. The patient states her exercise level  is moderate - she walks up to 6 miles 3-4 days a week.   . The patient's tobacco use is:  Social History   Tobacco Use  Smoking Status Never  Smokeless Tobacco Never  . She has been exposed to passive smoke. The patient's alcohol use is:  Social History   Substance and Sexual Activity  Alcohol Use No   Alcohol/week: 0.0 standard drinks  . Additional information: Last pap ***, next one scheduled for ***.    Review of Systems  Constitutional: Negative.   HENT: Negative.    Eyes: Negative.   Respiratory:  Negative.  Negative for shortness of breath.   Cardiovascular: Negative.   Gastrointestinal: Negative.   Endocrine: Negative.   Genitourinary: Negative.   Musculoskeletal: Negative.   Skin: Negative.   Allergic/Immunologic: Negative.   Neurological: Negative.  Negative for headaches.  Hematological: Negative.   Psychiatric/Behavioral: Negative.      Today's Vitals   07/05/21 1013  BP: 128/70  Pulse: 63  Temp: (!) 97.5 F (36.4 C)  TempSrc: Oral  Weight: 138 lb 9.6 oz (62.9 kg)  Height: 5' 5.6" (1.666 m)   Body mass index is 22.64 kg/m.  Wt Readings from Last 3 Encounters:  07/05/21 138 lb 9.6 oz (62.9 kg)  01/30/21 137 lb (62.1 kg)  10/24/20 138 lb 3.2 oz (62.7 kg)    Objective:  Physical Exam Vitals reviewed.  Constitutional:      General: She is not in acute distress.    Appearance: Normal appearance. She is well-developed. She is obese.  HENT:     Head: Normocephalic and atraumatic.     Right Ear: Hearing, tympanic membrane, ear canal and external ear normal. There is no impacted cerumen.     Left Ear: Hearing, tympanic membrane, ear canal and external ear normal. There is no impacted cerumen.     Nose:     Comments: Deferred - masked    Mouth/Throat:     Comments: Deferred - masked Eyes:     General: Lids are normal.     Extraocular Movements: Extraocular movements intact.     Conjunctiva/sclera: Conjunctivae normal.     Pupils: Pupils are equal, round, and reactive to light.     Funduscopic exam:    Right eye: No papilledema.        Left eye: No papilledema.  Neck:     Thyroid: No thyroid mass.     Vascular: No carotid bruit.  Cardiovascular:     Rate and Rhythm: Normal rate and regular rhythm.     Pulses: Normal pulses.     Heart sounds: Normal heart sounds. No murmur heard. Pulmonary:     Effort: Pulmonary effort is normal. No respiratory distress.     Breath sounds: Normal breath sounds. No wheezing.  Chest:     Chest wall: No mass.  Breasts:     Tanner Score is 5.     Right: Normal. No mass or tenderness.     Left: Normal. No mass or tenderness.  Abdominal:     General: Abdomen is flat. Bowel sounds are normal. There is no distension.     Palpations: Abdomen is soft.     Tenderness: There is no abdominal tenderness.  Genitourinary:    Rectum: Guaiac result negative.  Musculoskeletal:        General: No swelling. Normal range of motion.     Cervical back: Full passive range of motion without pain, normal range of motion and neck supple.  Right lower leg: No edema.     Left lower leg: No edema.  Lymphadenopathy:     Upper Body:     Right upper body: No supraclavicular, axillary or pectoral adenopathy.     Left upper body: No supraclavicular, axillary or pectoral adenopathy.  Skin:    General: Skin is warm and dry.     Capillary Refill: Capillary refill takes less than 2 seconds.  Neurological:     General: No focal deficit present.     Mental Status: She is alert and oriented to person, place, and time.     Cranial Nerves: No cranial nerve deficit.     Sensory: No sensory deficit.  Psychiatric:        Mood and Affect: Mood normal.        Behavior: Behavior normal.        Thought Content: Thought content normal.        Judgment: Judgment normal.        Assessment And Plan:     1. Encounter for annual physical exam  2. Primary hypertension - POCT Urinalysis Dipstick (81002) - Microalbumin / Creatinine Urine Ratio - EKG 12-Lead  3. Elevated cholesterol     Patient was given opportunity to ask questions. Patient verbalized understanding of the plan and was able to repeat key elements of the plan. All questions were answered to their satisfaction.   [name]   I, [name], have reviewed all documentation for this visit. The documentation on 07/05/21 for the exam, diagnosis, procedures, and orders are all accurate and complete.  THE PATIENT IS ENCOURAGED TO PRACTICE SOCIAL DISTANCING DUE TO THE  COVID-19 PANDEMIC.

## 2021-07-05 NOTE — Patient Instructions (Signed)

## 2021-07-06 LAB — CMP14+EGFR
ALT: 15 IU/L (ref 0–32)
AST: 19 IU/L (ref 0–40)
Albumin/Globulin Ratio: 2.4 — ABNORMAL HIGH (ref 1.2–2.2)
Albumin: 4.8 g/dL (ref 3.8–4.8)
Alkaline Phosphatase: 84 IU/L (ref 44–121)
BUN/Creatinine Ratio: 15 (ref 12–28)
BUN: 14 mg/dL (ref 8–27)
Bilirubin Total: 0.6 mg/dL (ref 0.0–1.2)
CO2: 25 mmol/L (ref 20–29)
Calcium: 10.5 mg/dL — ABNORMAL HIGH (ref 8.7–10.3)
Chloride: 104 mmol/L (ref 96–106)
Creatinine, Ser: 0.91 mg/dL (ref 0.57–1.00)
Globulin, Total: 2 g/dL (ref 1.5–4.5)
Glucose: 90 mg/dL (ref 70–99)
Potassium: 3.7 mmol/L (ref 3.5–5.2)
Sodium: 145 mmol/L — ABNORMAL HIGH (ref 134–144)
Total Protein: 6.8 g/dL (ref 6.0–8.5)
eGFR: 72 mL/min/{1.73_m2} (ref >59)

## 2021-07-06 LAB — CBC
Hematocrit: 41.2 % (ref 34.0–46.6)
Hemoglobin: 14.1 g/dL (ref 11.1–15.9)
MCH: 31.3 pg (ref 26.6–33.0)
MCHC: 34.2 g/dL (ref 31.5–35.7)
MCV: 92 fL (ref 79–97)
Platelets: 224 10*3/uL (ref 150–450)
RBC: 4.5 x10E6/uL (ref 3.77–5.28)
RDW: 12.6 % (ref 11.7–15.4)
WBC: 3.3 10*3/uL — ABNORMAL LOW (ref 3.4–10.8)

## 2021-07-06 LAB — LIPID PANEL
Chol/HDL Ratio: 2.7 ratio (ref 0.0–4.4)
Cholesterol, Total: 205 mg/dL — ABNORMAL HIGH (ref 100–199)
HDL: 75 mg/dL (ref >39)
LDL Chol Calc (NIH): 119 mg/dL — ABNORMAL HIGH (ref 0–99)
Triglycerides: 60 mg/dL (ref 0–149)
VLDL Cholesterol Cal: 11 mg/dL (ref 5–40)

## 2021-07-06 LAB — MICROALBUMIN / CREATININE URINE RATIO
Creatinine, Urine: 32.2 mg/dL
Microalb/Creat Ratio: <9 mg/g creat (ref 0–29)
Microalbumin, Urine: <3 ug/mL

## 2021-07-24 ENCOUNTER — Ambulatory Visit
Admission: RE | Admit: 2021-07-24 | Discharge: 2021-07-24 | Disposition: A | Discharge status: Home / Self Care | Payer: Managed Care, Other (non HMO) | Source: Ambulatory Visit | Attending: Hematology and Oncology | Admitting: Hematology and Oncology

## 2021-07-24 DIAGNOSIS — Z853 Personal history of malignant neoplasm of breast: Secondary | ICD-10-CM

## 2021-10-23 NOTE — Progress Notes (Signed)
Patient Care Team: [name], FNP as PCP - General (General Practice) Nicholas Lose, MD as Consulting Physician (Hematology and Oncology) Kyung Rudd, MD as Consulting Physician (Radiation Oncology) Erroll Luna, MD as Consulting Physician (General Surgery)  DIAGNOSIS:  Encounter Diagnosis  Name Primary?   Malignant neoplasm of upper-outer quadrant of right breast in female, estrogen receptor negative ([place])     SUMMARY OF ONCOLOGIC HISTORY: Oncology History  Malignant neoplasm of upper-outer quadrant of right breast in female, estrogen receptor negative ([place])  05/29/2018 Initial Diagnosis   Right axillary pain which led to a mammogram and ultrasound that revealed a new mass in the superior UOQ tail of the breast 10 o'clock position by ultrasound measured 8 mm, axilla negative, biopsy revealed grade 3 IDC triple negative with a Ki-67 of 20%, T1BN0 stage Ib clinical stage   06/04/2018 Cancer Staging   Staging form: Breast, AJCC 8th Edition - Clinical stage from 06/04/2018: Stage IB (cT1b, cN0, cM0, G3, ER-, PR-, HER2-) - Signed by Nicholas Lose, MD on 06/04/2018   06/17/2018 -  Neo-Adjuvant Chemotherapy   Dose dense Adriamycin and Cytoxan x4 followed by Taxol and carboplatin   07/15/2018 Genetic Testing   No pathogenic variants identified (negative testing) on the [employer] CancerNext+RNAinsight panel. The CancerNext gene panel offered by [employer] includes sequencing and rearrangement analysis for the following 34 genes:   APC, ATM, BARD1, BMPR1A, BRCA1, BRCA2, BRIP1, CDH1, CDK4, CDKN2A, CHEK2, DICER1, EPCAM, GREM1, HOXB13MLH1, MRE11A, MSH2, MSH6, MUTYH, NBN, NF1, PALB2, PMS2, POLD1, POLE, PTEN, RAD50, RAD51D, RAD51C, SMAD4, SMARCA4, STK11, and TP53. The report date is 07/15/2018.   10/02/2018 Surgery   Right Lumpectomy ([facility]): no residual carcinoma s/p neoadjuvant treatment, 0/3 lymph nodes negative for carcinoma.    11/06/2018 - 12/22/2018 Radiation Therapy   The patient initially  received a dose of 50.4 Gy in 28 fractions to the breast using whole-breast tangent fields. This was delivered using a 3-D conformal technique. The patient then received a boost to the seroma. This delivered an additional 10 Gy in 5 fractions using a electron boost with 41mV electrons. The total dose was 60.4 Gy.     CHIEF COMPLIANT:  Surveillance of triple negative right breast cancer    INTERVAL HISTORY: Katrina WAHLQUISTis a 61y.o. with above-mentioned history of triple negative right breast cancer who completed neoadjuvant chemotherapy, lumpectomy, radiation, and is currently on surveillance. She presents to the clinic today for a follow-up. She states she still have a little bit of neuropathy in the fingers but its manageable. She still having some tender pain under arm. She put some topical cream muscle relaxer on it. She still have some aches and pain but nothing she cant tolerate. She also taking antibiotics from a sinus infection. She had some mouth sores that had occurred after the sinus infection. She wants to join a gym so she can exercise a little more.  ALLERGIES:  is allergic to lisinopril.  MEDICATIONS:  Current Outpatient Medications  Medication Sig Dispense Refill   aspirin EC 81 MG EC tablet Take 1 tablet (81 mg total) by mouth daily. 90 tablet 0   chlorthalidone (HYGROTON) 25 MG tablet Take 1 tablet (25 mg total) by mouth daily. 90 tablet 1   diclofenac Sodium (VOLTAREN) 1 % GEL Apply 1 application topically 4 (four) times daily.     labetalol (NORMODYNE) 100 MG tablet Take 1 tablet (100 mg total) by mouth 2 (two) times daily. 180 tablet 1   mometasone (ELOCON) 0.1 %  ointment Apply 1 application topically as needed (dry skin).      Multiple Vitamin (MULTIVITAMIN ADULT PO) Take by mouth.     potassium chloride SA (KLOR-CON M) 20 MEQ tablet Take 1 tablet (20 mEq total) by mouth daily. 90 tablet 1   No current facility-administered medications for this visit.    PHYSICAL  EXAMINATION: ECOG PERFORMANCE STATUS: 1 - Symptomatic but completely ambulatory  Vitals:   11/06/21 0814  BP: 137/81  Pulse: 69  Resp: 18  Temp: 97.7 F (36.5 C)  SpO2: 100%   Filed Weights   11/06/21 0814  Weight: 137 lb 9.6 oz (62.4 kg)    BREAST: No palpable masses or nodules in either right or left breasts.  Tenderness in the right breast and axilla to palpation.  No palpable axillary supraclavicular or infraclavicular adenopathy no left breast tenderness or nipple discharge. (exam performed in the presence of a chaperone)  LABORATORY DATA:  I have reviewed the data as listed    Latest Ref Rng & Units 07/05/2021   11:25 AM 01/30/2021   10:49 AM 10/24/2020   11:26 AM  CMP  Glucose 70 - 99 mg/dL 90  90  91   BUN 8 - 27 mg/dL '14  14  14   ' Creatinine 0.57 - 1.00 mg/dL 0.91  0.92  0.94   Sodium 134 - 144 mmol/L 145  142  142   Potassium 3.5 - 5.2 mmol/L 3.7  3.7  3.8   Chloride 96 - 106 mmol/L 104  99  100   CO2 20 - 29 mmol/L '25  28  28   ' Calcium 8.7 - 10.3 mg/dL 10.5  10.1  10.3   Total Protein 6.0 - 8.5 g/dL 6.8   7.1   Total Bilirubin 0.0 - 1.2 mg/dL 0.6   0.6   Alkaline Phos 44 - 121 IU/L 84   93   AST 0 - 40 IU/L 19   19   ALT 0 - 32 IU/L 15   12     Lab Results  Component Value Date   WBC 3.3 (L) 07/05/2021   HGB 14.1 07/05/2021   HCT 41.2 07/05/2021   MCV 92 07/05/2021   PLT 224 07/05/2021   NEUTROABS 1.4 (L) 09/29/2018    ASSESSMENT & PLAN:  Malignant neoplasm of upper-outer quadrant of right breast in female, estrogen receptor negative ([facility]) 05/29/2018:Right axillary pain which led to a mammogram and ultrasound that revealed a new mass in the superior UOQ tail of the breast 10 o'clock position by ultrasound measured 8 mm, axilla negative, biopsy revealed 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 Neoadjuvant chemotherapy with Adriamycin and Cytoxan dose dense 4 followed by Taxol weekly 2 and  carboplatin every 3 weeks ( Discontinued due to toxicities) 2. 10/02/2018:Right Lumpectomy ([facility]): no residual carcinoma s/p neoadjuvant treatment, 0/3 lymph nodes negative for carcinoma. ([facility]) 3. Followed by adjuvant radiation therapy completed 12/22/2018 Genetics negative ---------------------------------------------------------------------------------------------------------------------------------- Breast Cancer Surveillance:  1. Mammograms 07/24/21: Benign (December 2021: Biopsy of the right breast nodule: Fat necrosis) tenderness in the breast to palpation. 2. 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 Survivorship: She is walking 10,000 steps per day and staying active.   RTC in 1 year  No orders of the defined types were placed in this encounter.  The patient has a good understanding of the overall plan. she agrees with it. she will call with any problems that may develop before the next visit here. Total time spent: 30 mins including face to face time and time spent for planning, charting and co-ordination of care   Katrina Ohara, MD 11/06/21    I Gardiner Coins am scribing for Dr. Lindi Adie  I have reviewed the above documentation for accuracy and completeness, and I agree with the above.

## 2021-11-01 ENCOUNTER — Encounter (HOSPITAL_COMMUNITY): Payer: Self-pay

## 2021-11-01 ENCOUNTER — Ambulatory Visit (HOSPITAL_COMMUNITY)
Admission: RE | Admit: 2021-11-01 | Discharge: 2021-11-01 | Disposition: A | Discharge status: Home / Self Care | Payer: Managed Care, Other (non HMO) | Source: Ambulatory Visit | Attending: Family Medicine | Admitting: Family Medicine

## 2021-11-01 VITALS — BP 133/81 | HR 73 | Temp 98.5°F | Resp 16

## 2021-11-01 DIAGNOSIS — J0101 Acute recurrent maxillary sinusitis: Secondary | ICD-10-CM

## 2021-11-01 MED ORDER — DOXYCYCLINE HYCLATE 100 MG PO CAPS: 100.0000 mg | ORAL_CAPSULE | Freq: Two times a day (BID) | ORAL | 0 refills | Status: DC

## 2021-11-01 NOTE — ED Provider Notes (Signed)
[place]    CSN: [id number] Arrival date & time: 11/01/21  1421      History   Chief Complaint Chief Complaint  Patient presents with   Nasal Congestion   APPT 1430    HPI Katrina Liu is a 61 y.o. female.  She reports being sick for about a month.  Started out as what she thought was a regular cold.  She did test herself for COVID at that time and it was negative.  As her symptoms progressed and became worse and her nasal congestion became thicker and discolored, she had a video virtual visit with a provider who prescribed amoxicillin.  She felt like the amoxicillin helped improve her symptoms but after she completed the antibiotic her symptoms returned worse than before.  She reports significant nasal congestion and sinus pressure along with thick green-brown nasal discharge.  She denies fever or chills.  Has been using Mucinex DM without relief of symptoms.  She did try using a Nettie pot a couple of times but was worried because some saline drained down her throat while using it and she could hear her ears popping and congestion moving in her ears after using it.  She also reports the amoxicillin caused her to develop mouth sores that have since resolved.  HPI  Past Medical History:  Diagnosis Date   Anemia    Breast cancer ([place]) 10/02/2018   right breast lumpectomy   Family history of prostate cancer    GERD (gastroesophageal reflux disease)    h/o - not now   Headache(784.0)    Hypertension    Personal history of chemotherapy    Personal history of radiation therapy    S/P abdominal hysterectomy 04/17/2011   Stroke [facility])    no residual    Patient Active Problem List   Diagnosis Date Noted   Multinodular goiter 10/26/2020   Port-A-Cath in place 07/01/2018   Genetic testing 06/24/2018   Family history of prostate cancer    Malignant neoplasm of upper-outer quadrant of right breast in female, estrogen receptor negative ([place]) 06/02/2018   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   Insomnia 06/03/2014   Migraine, unspecified, without mention of intractable migraine without mention of status migrainosus 12/25/2012   Cerebellar stroke syndrome 12/22/2012   Hypertension 12/22/2012   S/P abdominal hysterectomy 04/17/2011    Past Surgical History:  Procedure Laterality Date   ABDOMINAL HYSTERECTOMY  04/16/2011   Procedure: HYSTERECTOMY ABDOMINAL;  Surgeon: Marvene Staff, MD;  Location: [facility];  Service: Gynecology;  Laterality: N/A;   BREAST BIOPSY Left 2013   BREAST BIOPSY Right 2020   BREAST LUMPECTOMY Right 10/02/2018   BREAST LUMPECTOMY WITH RADIOACTIVE SEED AND SENTINEL LYMPH NODE BIOPSY Right 10/02/2018   Procedure: RIGHT BREAST LUMPECTOMY WITH RADIOACTIVE SEED AND RIGHT SENTINEL LYMPH NODE MAPPING;  Surgeon: Erroll Luna, MD;  Location: [place];  Service: General;  Laterality: Right;   KIDNEY STONE SURGERY     LAPAROTOMY     ectopic pregnancy   LOOP RECORDER IMPLANT N/A 12/24/2012   Procedure: LOOP RECORDER IMPLANT;  Surgeon: Deboraha Sprang, MD;  Location: [facility] CATH LAB;  Service: Cardiovascular;  Laterality: N/A;   LOOP RECORDER REMOVAL N/A 08/15/2016   Procedure: Loop Recorder Removal;  Surgeon: Deboraha Sprang, MD;  Location: [place] CV LAB;  Service: Cardiovascular;  Laterality: N/A;   SHOULDER ARTHROSCOPY     TEE WITHOUT CARDIOVERSION N/A 12/24/2012   Procedure: TRANSESOPHAGEAL ECHOCARDIOGRAM (TEE);  Surgeon:  Jettie Booze, MD;  Location: [facility] ENDOSCOPY;  Service: Cardiovascular;  Laterality: N/A;    OB History   No obstetric history on file.      Home Medications    Prior to Admission medications   Medication Sig Start Date End Date Taking? Authorizing Provider  aspirin EC 81 MG EC tablet Take 1 tablet (81 mg total) by mouth daily. 12/25/12  Yes Allie Bossier, MD  chlorthalidone (HYGROTON) 25 MG tablet Take 1 tablet (25 mg total) by mouth daily. 07/05/21  Yes [name], FNP  doxycycline  (VIBRAMYCIN) 100 MG capsule Take 1 capsule (100 mg total) by mouth 2 (two) times daily. 11/01/21  Yes Carvel Getting, NP  labetalol (NORMODYNE) 100 MG tablet Take 1 tablet (100 mg total) by mouth 2 (two) times daily. 07/05/21  Yes [name], FNP  Multiple Vitamin (MULTIVITAMIN ADULT PO) Take by mouth.   Yes [provider]  potassium chloride SA (KLOR-CON M) 20 MEQ tablet Take 1 tablet (20 mEq total) by mouth daily. 07/05/21  Yes [name], FNP  diclofenac Sodium (VOLTAREN) 1 % GEL Apply 1 application topically 4 (four) times daily. 01/22/21   [provider]  mometasone (ELOCON) 0.1 % ointment Apply 1 application topically as needed (dry skin).     [provider]  prochlorperazine (COMPAZINE) 10 MG tablet Take 1 tablet (10 mg total) by mouth every 6 (six) hours as needed (Nausea or vomiting). 06/04/18 09/03/18  Nicholas Lose, MD    Family History Family History  Problem Relation Age of Onset   Cancer Father    Stroke Father 80   Lung cancer Father    Prostate cancer Paternal Uncle        dx 34's?   Diabetes Maternal Grandfather    Breast cancer Neg Hx     Social History Social History   Tobacco Use   Smoking status: Never   Smokeless tobacco: Never  Vaping Use   Vaping Use: Never used  Substance Use Topics   Alcohol use: No    Alcohol/week: 0.0 standard drinks of alcohol   Drug use: No     Allergies   Lisinopril   Review of Systems Review of Systems   Physical Exam Triage Vital Signs ED Triage Vitals  Enc Vitals Group     BP 11/01/21 1431 133/81     Pulse Rate 11/01/21 1431 73     Resp 11/01/21 1431 16     Temp 11/01/21 1431 98.5 F (36.9 C)     Temp Source 11/01/21 1431 Oral     SpO2 11/01/21 1431 99 %     Weight --      Height --      Head Circumference --      Peak Flow --      Pain Score 11/01/21 1436 0     Pain Loc --      Pain Edu? --      Excl. in [place]? --    No data found.  Updated Vital Signs BP 133/81 (BP Location:  Left Arm)   Pulse 73   Temp 98.5 F (36.9 C) (Oral)   Resp 16   LMP 04/16/2011   SpO2 99%   Visual Acuity Right Eye Distance:   Left Eye Distance:   Bilateral Distance:    Right Eye Near:   Left Eye Near:    Bilateral Near:     Physical Exam Constitutional:      General: She is  in acute distress.     Appearance: Normal appearance. She is not ill-appearing.  HENT:     Right Ear: Tympanic membrane, ear canal and external ear normal.     Left Ear: Tympanic membrane, ear canal and external ear normal.     Nose: Congestion present.     Right Sinus: Maxillary sinus tenderness present.     Left Sinus: Maxillary sinus tenderness present.     Mouth/Throat:     Mouth: Mucous membranes are moist.     Pharynx: Oropharynx is clear.  Cardiovascular:     Rate and Rhythm: Normal rate and regular rhythm.  Pulmonary:     Effort: Pulmonary effort is normal.     Breath sounds: Normal breath sounds.  Lymphadenopathy:     Head:     Right side of head: No submandibular adenopathy.     Left side of head: No submandibular adenopathy.  Neurological:     Mental Status: She is alert.      UC Treatments / Results  Labs (all labs ordered are listed, but only abnormal results are displayed) Labs Reviewed - No data to display  EKG   Radiology No results found.  Procedures Procedures (including critical care time)  Medications Ordered in UC Medications - No data to display  Initial Impression / Assessment and Plan / UC Course  I have reviewed the triage vital signs and the nursing notes.  Pertinent labs & imaging results that were available during my care of the patient were reviewed by me and considered in my medical decision making (see chart for details).    Sounds like patient had a bacterial sinus infection which the amoxicillin prescribed early or partially treated.  We discussed proper use of Nettie pot and saline irrigation.  Prescribed doxycycline.  Final Clinical  Impressions(s) / UC Diagnoses   Final diagnoses:  Acute recurrent maxillary sinusitis     Discharge Instructions      Try using saline irrigation, such as with a neti pot, several times a day while you are sick. Many neti pots come with salt packets premeasured to use to make saline. If you use your own salt, make sure it is kosher salt or sea salt (don't use table salt as it has iodine in it and you don't need that in your nose). Use distilled water to make saline. If you mix your own saline using your own salt, the recipe is 1/4 teaspoon salt in 1 cup warm water. Using saline irrigation can help prevent and treat sinus infections. Blow your nose gently. Adjust the tilt of your head and how much you are bent over to keep it from getting saline in your throat.   Try plain mucinex (generic guaifenesin ok to use or plain robitussin or generic) and drink lots of liquids to help thin out congestion.     ED Prescriptions     Medication Sig Dispense Auth. Provider   doxycycline (VIBRAMYCIN) 100 MG capsule Take 1 capsule (100 mg total) by mouth 2 (two) times daily. 20 capsule Carvel Getting, NP      PDMP not reviewed this encounter.   Carvel Getting, NP 11/01/21 1529

## 2021-11-01 NOTE — Discharge Instructions (Signed)
Try using saline irrigation, such as with a neti pot, several times a day while you are sick. Many neti pots come with salt packets premeasured to use to make saline. If you use your own salt, make sure it is kosher salt or sea salt (don't use table salt as it has iodine in it and you don't need that in your nose). Use distilled water to make saline. If you mix your own saline using your own salt, the recipe is 1/4 teaspoon salt in 1 cup warm water. Using saline irrigation can help prevent and treat sinus infections. Blow your nose gently. Adjust the tilt of your head and how much you are bent over to keep it from getting saline in your throat.   Try plain mucinex (generic guaifenesin ok to use or plain robitussin or generic) and drink lots of liquids to help thin out congestion.

## 2021-11-01 NOTE — ED Triage Notes (Addendum)
Patient c/o nasal congestion x 1 month.   Patient endorses dry mouth.   Patient was seen by another provider for a virtual visit. Patient was prescribed an antibiotic and developed mouth sore but still finished course. Patient states " my symptoms improved a little after the antibiotic but didn't completely clear up".   Patient took an at home COVID test with negative results.   Patient has used Mucinex DM, Humidifier, and Saline Spray with no relief of symptoms.

## 2021-11-06 ENCOUNTER — Other Ambulatory Visit: Payer: Self-pay

## 2021-11-06 ENCOUNTER — Ambulatory Visit: Payer: Managed Care, Other (non HMO) | Admitting: Nurse Practitioner

## 2021-11-06 ENCOUNTER — Inpatient Hospital Stay: Payer: Managed Care, Other (non HMO) | Attending: Hematology and Oncology | Admitting: Hematology and Oncology

## 2021-11-06 DIAGNOSIS — Z923 Personal history of irradiation: Secondary | ICD-10-CM | POA: Diagnosis not present

## 2021-11-06 DIAGNOSIS — C50411 Malignant neoplasm of upper-outer quadrant of right female breast: Secondary | ICD-10-CM | POA: Diagnosis not present

## 2021-11-06 DIAGNOSIS — Z171 Estrogen receptor negative status [ER-]: Secondary | ICD-10-CM

## 2021-11-06 DIAGNOSIS — Z853 Personal history of malignant neoplasm of breast: Secondary | ICD-10-CM | POA: Diagnosis present

## 2021-11-06 DIAGNOSIS — Z9221 Personal history of antineoplastic chemotherapy: Secondary | ICD-10-CM | POA: Diagnosis not present

## 2021-11-06 DIAGNOSIS — G629 Polyneuropathy, unspecified: Secondary | ICD-10-CM | POA: Diagnosis not present

## 2021-11-06 DIAGNOSIS — Z7982 Long term (current) use of aspirin: Secondary | ICD-10-CM | POA: Diagnosis not present

## 2021-11-06 DIAGNOSIS — Z79899 Other long term (current) drug therapy: Secondary | ICD-10-CM | POA: Diagnosis not present

## 2021-11-06 NOTE — Assessment & Plan Note (Addendum)
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 Neoadjuvant chemotherapy with Adriamycin and Cytoxan dose dense 4 followed byTaxolweekly 2and carboplatin every 3 weeks ( Discontinued due to toxicities) 2. 10/02/2018:Right Lumpectomy ([place]): no residual carcinoma s/p neoadjuvant treatment, 0/3 lymph nodes negative for carcinoma.(Path CR) 3. Followed by adjuvant radiation therapycompleted 12/22/2018 Geneticsnegative ---------------------------------------------------------------------------------------------------------------------------------- Breast Cancer Surveillance:  1. Mammograms 07/24/21: Benign (December 2021: Biopsy of the right breast nodule: Fat necrosis) tenderness in the breast to palpation. 2. Breast Exam:11/06/21: Benign Bone density to be done 07/10/2019 as well  Mild peripheral neuropathy: Not significant    Her grandson is now  30 years old and she is expecting another grandchild in January 2024. Survivorship: She is walking 10,000 steps per day and staying active.  RTC in 1 year

## 2021-11-07 ENCOUNTER — Telehealth: Payer: Self-pay | Admitting: Hematology and Oncology

## 2021-11-07 NOTE — Telephone Encounter (Signed)
Scheduled appointment per 7/10 los. Left voicemail. 

## 2021-11-09 ENCOUNTER — Ambulatory Visit: Payer: Managed Care, Other (non HMO) | Admitting: Nurse Practitioner

## 2022-01-06 ENCOUNTER — Other Ambulatory Visit: Payer: Self-pay | Admitting: Nurse Practitioner

## 2022-01-07 NOTE — Progress Notes (Unsigned)
I,Katrina Liu,acting as a Education administrator for [employer], FNP.,have documented all relevant documentation on the behalf of Katrina Brine, FNP,as directed by  Katrina Brine, FNP while in the presence of Katrina Brine, FNP   Subjective:     Patient ID: Katrina Liu , female    DOB: 1961/02/02 , 61 y.o.   MRN: [id number]   No chief complaint on file.   HPI  The patient is here for a blood pressure and cholesterol f/u.   Hypertension This is a chronic problem. The current episode started more than 1 year ago. The problem is controlled. Pertinent negatives include no anxiety, headaches or shortness of breath. Past treatments include diuretics.     Past Medical History:  Diagnosis Date   Anemia    Breast cancer ([place]) 10/02/2018   right breast lumpectomy   Family history of prostate cancer    GERD (gastroesophageal reflux disease)    h/o - not now   Headache(784.0)    Hypertension    Personal history of chemotherapy    Personal history of radiation therapy    S/P abdominal hysterectomy 04/17/2011   Stroke ([place])    no residual     Family History  Problem Relation Age of Onset   Cancer Father    Stroke Father 45   Lung cancer Father    Prostate cancer Paternal Uncle        dx 40's?   Diabetes Maternal Grandfather    Breast cancer Neg Hx      Current Outpatient Medications:    aspirin EC 81 MG EC tablet, Take 1 tablet (81 mg total) by mouth daily., Disp: 90 tablet, Rfl: 0   chlorthalidone (HYGROTON) 25 MG tablet, Take 1 tablet (25 mg total) by mouth daily., Disp: 90 tablet, Rfl: 1   diclofenac Sodium (VOLTAREN) 1 % GEL, Apply 1 application topically 4 (four) times daily., Disp: , Rfl:    labetalol (NORMODYNE) 100 MG tablet, TAKE ONE TABLET BY MOUTH TWICE A DAY, Disp: 180 tablet, Rfl: 1   mometasone (ELOCON) 0.1 % ointment, Apply 1 application topically as needed (dry skin). , Disp: , Rfl:    Multiple Vitamin (MULTIVITAMIN ADULT PO), Take by mouth., Disp: , Rfl:    potassium  chloride SA (KLOR-CON M) 20 MEQ tablet, Take 1 tablet (20 mEq total) by mouth daily., Disp: 90 tablet, Rfl: 1   Allergies  Allergen Reactions   Lisinopril Swelling     Review of Systems  Constitutional: Negative.   Respiratory: Negative.  Negative for shortness of breath.   Cardiovascular: Negative.   Gastrointestinal: Negative.   Neurological: Negative.  Negative for headaches.     There were no vitals filed for this visit. There is no height or weight on file to calculate BMI.   Objective:  Physical Exam      Assessment And Plan:     There are no diagnoses linked to this encounter.    Patient was given opportunity to ask questions. Patient verbalized understanding of the plan and was able to repeat key elements of the plan. All questions were answered to their satisfaction.  [name]   I, [name], have reviewed all documentation for this visit. The documentation on 01/07/22 for the exam, diagnosis, procedures, and orders are all accurate and complete.   IF YOU HAVE BEEN REFERRED TO A SPECIALIST, IT MAY TAKE 1-2 WEEKS TO SCHEDULE/PROCESS THE REFERRAL. IF YOU HAVE NOT HEARD FROM US/SPECIALIST IN TWO WEEKS, PLEASE GIVE [place] A CALL  AT [phone].   THE PATIENT IS ENCOURAGED TO PRACTICE SOCIAL DISTANCING DUE TO THE COVID-19 PANDEMIC.

## 2022-01-08 ENCOUNTER — Encounter: Payer: Self-pay | Admitting: Nurse Practitioner

## 2022-01-08 ENCOUNTER — Ambulatory Visit: Payer: Managed Care, Other (non HMO) | Admitting: Nurse Practitioner

## 2022-01-08 VITALS — BP 100/60 | HR 78 | Temp 98.2°F | Ht 65.6 in | Wt 132.0 lb

## 2022-01-08 DIAGNOSIS — E7800 Pure hypercholesterolemia, unspecified: Secondary | ICD-10-CM

## 2022-01-08 DIAGNOSIS — E782 Mixed hyperlipidemia: Secondary | ICD-10-CM | POA: Diagnosis not present

## 2022-01-08 DIAGNOSIS — I10 Essential (primary) hypertension: Secondary | ICD-10-CM | POA: Diagnosis not present

## 2022-01-08 DIAGNOSIS — Z8679 Personal history of other diseases of the circulatory system: Secondary | ICD-10-CM | POA: Diagnosis not present

## 2022-01-08 DIAGNOSIS — E042 Nontoxic multinodular goiter: Secondary | ICD-10-CM | POA: Diagnosis not present

## 2022-01-08 DIAGNOSIS — I480 Paroxysmal atrial fibrillation: Secondary | ICD-10-CM | POA: Insufficient documentation

## 2022-01-08 MED ORDER — CHLORTHALIDONE 25 MG PO TABS: 25.0000 mg | ORAL_TABLET | Freq: Every day | ORAL | 1 refills | Status: DC

## 2022-01-08 MED ORDER — POTASSIUM CHLORIDE CRYS ER 20 MEQ PO TBCR: 20.0000 meq | EXTENDED_RELEASE_TABLET | Freq: Every day | ORAL | 1 refills | Status: DC

## 2022-01-08 MED ORDER — LABETALOL HCL 100 MG PO TABS: 100.0000 mg | ORAL_TABLET | Freq: Two times a day (BID) | ORAL | 1 refills | Status: DC

## 2022-01-08 NOTE — Patient Instructions (Signed)

## 2022-01-09 LAB — LIPID PANEL
Chol/HDL Ratio: 2.3 ratio (ref 0.0–4.4)
Cholesterol, Total: 163 mg/dL (ref 100–199)
HDL: 70 mg/dL (ref >39)
LDL Chol Calc (NIH): 82 mg/dL (ref 0–99)
Triglycerides: 56 mg/dL (ref 0–149)
VLDL Cholesterol Cal: 11 mg/dL (ref 5–40)

## 2022-01-09 LAB — CMP14+EGFR
ALT: 16 IU/L (ref 0–32)
AST: 23 IU/L (ref 0–40)
Albumin/Globulin Ratio: 2.2 (ref 1.2–2.2)
Albumin: 4.8 g/dL (ref 3.9–4.9)
Alkaline Phosphatase: 73 IU/L (ref 44–121)
BUN/Creatinine Ratio: 10 — ABNORMAL LOW (ref 12–28)
BUN: 11 mg/dL (ref 8–27)
Bilirubin Total: 0.6 mg/dL (ref 0.0–1.2)
CO2: 27 mmol/L (ref 20–29)
Calcium: 9.6 mg/dL (ref 8.7–10.3)
Chloride: 98 mmol/L (ref 96–106)
Creatinine, Ser: 1.05 mg/dL — ABNORMAL HIGH (ref 0.57–1.00)
Globulin, Total: 2.2 g/dL (ref 1.5–4.5)
Glucose: 98 mg/dL (ref 70–99)
Potassium: 3.3 mmol/L — ABNORMAL LOW (ref 3.5–5.2)
Sodium: 139 mmol/L (ref 134–144)
Total Protein: 7 g/dL (ref 6.0–8.5)
eGFR: 60 mL/min/{1.73_m2} (ref >59)

## 2022-01-12 ENCOUNTER — Other Ambulatory Visit: Payer: Self-pay | Admitting: Nurse Practitioner

## 2022-01-12 DIAGNOSIS — I10 Essential (primary) hypertension: Secondary | ICD-10-CM

## 2022-03-04 ENCOUNTER — Ambulatory Visit (HOSPITAL_COMMUNITY)
Admission: RE | Admit: 2022-03-04 | Discharge: 2022-03-04 | Disposition: A | Discharge status: Home / Self Care | Payer: Managed Care, Other (non HMO) | Source: Ambulatory Visit | Attending: Family Medicine | Admitting: Family Medicine

## 2022-03-04 ENCOUNTER — Other Ambulatory Visit: Payer: Self-pay

## 2022-03-04 ENCOUNTER — Encounter (HOSPITAL_COMMUNITY): Payer: Self-pay

## 2022-03-04 VITALS — BP 113/70 | HR 87 | Temp 98.6°F | Resp 18

## 2022-03-04 DIAGNOSIS — R059 Cough, unspecified: Secondary | ICD-10-CM | POA: Insufficient documentation

## 2022-03-04 DIAGNOSIS — Z1152 Encounter for screening for COVID-19: Secondary | ICD-10-CM | POA: Insufficient documentation

## 2022-03-04 DIAGNOSIS — R0602 Shortness of breath: Secondary | ICD-10-CM | POA: Diagnosis not present

## 2022-03-04 DIAGNOSIS — J0100 Acute maxillary sinusitis, unspecified: Secondary | ICD-10-CM | POA: Insufficient documentation

## 2022-03-04 MED ORDER — CHERATUSSIN AC 100-10 MG/5ML PO SOLN: 5.0000 mL | Freq: Four times a day (QID) | ORAL | 0 refills | Status: DC | PRN

## 2022-03-04 MED ORDER — GENTAMICIN SULFATE 0.3 % OP SOLN: 2.0000 [drp] | Freq: Three times a day (TID) | OPHTHALMIC | 0 refills | Status: AC

## 2022-03-04 MED ORDER — DOXYCYCLINE HYCLATE 100 MG PO CAPS: 100.0000 mg | ORAL_CAPSULE | Freq: Two times a day (BID) | ORAL | 0 refills | Status: AC

## 2022-03-04 NOTE — Discharge Instructions (Signed)
Take doxycycline 100 mg --1 capsule 2 times daily for 7 days  Put gentamicin eyedrops in your left eye 3 times daily for 5 days  Robitussin with codeine--take 5 mL or 1 teaspoon every 6 hours as needed for cough

## 2022-03-04 NOTE — ED Provider Notes (Addendum)
[place]    CSN: [id number] Arrival date & time: 03/04/22  1318      History   Chief Complaint Chief Complaint  Patient presents with   Nasal Congestion   Appointment    13:30 pm    HPI Katrina Liu is a 61 y.o. female.   HPI Here for cough and congestion.  Symptoms began October 30 or October 31.  No fever or chills.  She has had a good bit of aches and malaise at times.  Occasionally she is short of breath, more when she is exerted herself.  No vomiting or diarrhea.  On November 1 began having some redness in her eye with some eye drainage.  She had a virtual visit on November 3 and the pills for cough were not helping.  She now has more pressure in her head and face and feels worse since yesterday.  She is having a hard time getting the congestion out.  Past Medical History:  Diagnosis Date   Anemia    Breast cancer ([place]) 10/02/2018   right breast lumpectomy   Family history of prostate cancer    GERD (gastroesophageal reflux disease)    h/o - not now   Headache(784.0)    Hypertension    Personal history of chemotherapy    Personal history of radiation therapy    S/P abdominal hysterectomy 04/17/2011   Stroke [facility])    no residual    Patient Active Problem List   Diagnosis Date Noted   Paroxysmal atrial fibrillation ([place]) 01/08/2022   Mixed hyperlipidemia 01/08/2022   Multinodular goiter 10/26/2020   Port-A-Cath in place 07/01/2018   Genetic testing 06/24/2018   Family history of prostate cancer    Malignant neoplasm of upper-outer quadrant of right breast in female, estrogen receptor negative ([place]) 06/02/2018   Recurrent oral ulcers 06/25/2017   Insomnia 06/03/2014   Migraine, unspecified, without mention of intractable migraine without mention of status migrainosus 12/25/2012   Cerebellar stroke syndrome 12/22/2012   Hypertension 12/22/2012   S/P abdominal hysterectomy 04/17/2011    Past Surgical History:  Procedure Laterality Date    ABDOMINAL HYSTERECTOMY  04/16/2011   Procedure: HYSTERECTOMY ABDOMINAL;  Surgeon: Marvene Staff, MD;  Location: [facility];  Service: Gynecology;  Laterality: N/A;   BREAST BIOPSY Left 2013   BREAST BIOPSY Right 2020   BREAST LUMPECTOMY Right 10/02/2018   BREAST LUMPECTOMY WITH RADIOACTIVE SEED AND SENTINEL LYMPH NODE BIOPSY Right 10/02/2018   Procedure: RIGHT BREAST LUMPECTOMY WITH RADIOACTIVE SEED AND RIGHT SENTINEL LYMPH NODE MAPPING;  Surgeon: Erroll Luna, MD;  Location: [facility];  Service: General;  Laterality: Right;   KIDNEY STONE SURGERY     LAPAROTOMY     ectopic pregnancy   LOOP RECORDER IMPLANT N/A 12/24/2012   Procedure: LOOP RECORDER IMPLANT;  Surgeon: Deboraha Sprang, MD;  Location: [facility] CATH LAB;  Service: Cardiovascular;  Laterality: N/A;   LOOP RECORDER REMOVAL N/A 08/15/2016   Procedure: Loop Recorder Removal;  Surgeon: Deboraha Sprang, MD;  Location: [place] CV LAB;  Service: Cardiovascular;  Laterality: N/A;   SHOULDER ARTHROSCOPY     TEE WITHOUT CARDIOVERSION N/A 12/24/2012   Procedure: TRANSESOPHAGEAL ECHOCARDIOGRAM (TEE);  Surgeon: Jettie Booze, MD;  Location[facility]OSCOPY;  Service: Cardiovascular;  Laterality: N/A;    OB History   No obstetric history on file.      Home Medications    Prior to Admission medications   Medication Sig Start Date End  Date Taking? Authorizing Provider  doxycycline (VIBRAMYCIN) 100 MG capsule Take 1 capsule (100 mg total) by mouth 2 (two) times daily for 7 days. 03/04/22 03/11/22 Yes Shellsea Borunda, Gwenlyn Perking, MD  gentamicin (GARAMYCIN) 0.3 % ophthalmic solution Place 2 drops into the left eye 3 (three) times daily for 5 days. 03/04/22 03/09/22 Yes Sharra Cayabyab, Gwenlyn Perking, MD  guaiFENesin-codeine (CHERATUSSIN AC) 100-10 MG/5ML syrup Take 5 mLs by mouth 4 (four) times daily as needed for cough. 03/04/22  Yes Barrett Henle, MD  aspirin EC 81 MG EC tablet Take 1 tablet (81 mg total) by mouth daily. 12/25/12   Allie Bossier, MD  chlorthalidone (HYGROTON) 25 MG tablet Take 1 tablet (25 mg total) by mouth daily. 01/08/22   [name], FNP  diclofenac Sodium (VOLTAREN) 1 % GEL Apply 1 application topically 4 (four) times daily. 01/22/21   [provider]  gabapentin (NEURONTIN) 100 MG capsule Take 1-3 capsules at night as needed    [provider]  labetalol (NORMODYNE) 100 MG tablet Take 1 tablet (100 mg total) by mouth 2 (two) times daily. 01/08/22   [name], FNP  mometasone (ELOCON) 0.1 % ointment Apply 1 application topically as needed (dry skin).     [provider]  Multiple Vitamin (MULTIVITAMIN ADULT PO) Take by mouth.    [provider]  potassium chloride SA (KLOR-CON M) 20 MEQ tablet Take 1 tablet (20 mEq total) by mouth daily. 01/08/22   [name], FNP  prochlorperazine (COMPAZINE) 10 MG tablet Take 1 tablet (10 mg total) by mouth every 6 (six) hours as needed (Nausea or vomiting). 06/04/18 09/03/18  Nicholas Lose, MD    Family History Family History  Problem Relation Age of Onset   Cancer Father    Stroke Father 53   Lung cancer Father    Prostate cancer Paternal Uncle        dx 57's?   Diabetes Maternal Grandfather    Breast cancer Neg Hx     Social History Social History   Tobacco Use   Smoking status: Never   Smokeless tobacco: Never  Vaping Use   Vaping Use: Never used  Substance Use Topics   Alcohol use: No    Alcohol/week: 0.0 standard drinks of alcohol   Drug use: No     Allergies   Lisinopril   Review of Systems Review of Systems   Physical Exam Triage Vital Signs ED Triage Vitals  Enc Vitals Group     BP 03/04/22 1403 113/70     Pulse Rate 03/04/22 1403 87     Resp 03/04/22 1403 18     Temp 03/04/22 1403 98.6 F (37 C)     Temp Source 03/04/22 1403 Oral     SpO2 03/04/22 1403 97 %     Weight --      Height --      Head Circumference --      Peak Flow --      Pain Score 03/04/22 1400 8     Pain Loc --       Pain Edu? --      Excl. in [place]? --    No data found.  Updated Vital Signs BP 113/70 (BP Location: Left Arm)   Pulse 87   Temp 98.6 F (37 C) (Oral)   Resp 18   LMP 04/16/2011   SpO2 97%   Visual Acuity Right Eye Distance:   Left Eye Distance:   Bilateral Distance:  Right Eye Near:   Left Eye Near:    Bilateral Near:     Physical Exam Vitals reviewed.  Constitutional:      General: She is not in acute distress.    Appearance: She is not toxic-appearing.  HENT:     Right Ear: Tympanic membrane and ear canal normal.     Left Ear: Tympanic membrane and ear canal normal.     Nose: Congestion present.     Mouth/Throat:     Mouth: Mucous membranes are moist.     Comments: There is white drainage in the oropharynx Eyes:     Extraocular Movements: Extraocular movements intact.     Conjunctiva/sclera: Conjunctivae normal.     Pupils: Pupils are equal, round, and reactive to light.  Cardiovascular:     Rate and Rhythm: Normal rate and regular rhythm.     Heart sounds: No murmur heard. Pulmonary:     Effort: Pulmonary effort is normal. No respiratory distress.     Breath sounds: No stridor. No wheezing, rhonchi or rales.  Musculoskeletal:     Cervical back: Neck supple.  Lymphadenopathy:     Cervical: No cervical adenopathy.  Skin:    Capillary Refill: Capillary refill takes less than 2 seconds.     Coloration: Skin is not jaundiced or pale.  Neurological:     General: No focal deficit present.     Mental Status: She is alert and oriented to person, place, and time.  Psychiatric:        Behavior: Behavior normal.      UC Treatments / Results  Labs (all labs ordered are listed, but only abnormal results are displayed) Labs Reviewed  SARS CORONAVIRUS 2 (TAT 6-24 HRS)    EKG   Radiology No results found.  Procedures Procedures (including critical care time)  Medications Ordered in UC Medications - No data to display  Initial Impression / Assessment  and Plan / UC Course  I have reviewed the triage vital signs and the nursing notes.  Pertinent labs & imaging results that were available during my care of the patient were reviewed by me and considered in my medical decision making (see chart for details).        I am going to treat for possible acute sinusitis.  Also gentamycin drops were sent in Robitussin with codeine.  We decided to go ahead and do a COVID swab PCR here; she is on day 5 of symptoms and by the time she gets results, she most likely would not be a candidate for antiviral treatment Final Clinical Impressions(s) / UC Diagnoses   Final diagnoses:  Acute maxillary sinusitis, recurrence not specified     Discharge Instructions      Take doxycycline 100 mg --1 capsule 2 times daily for 7 days  Put gentamicin eyedrops in your left eye 3 times daily for 5 days  Robitussin with codeine--take 5 mL or 1 teaspoon every 6 hours as needed for cough     ED Prescriptions     Medication Sig Dispense Auth. Provider   doxycycline (VIBRAMYCIN) 100 MG capsule Take 1 capsule (100 mg total) by mouth 2 (two) times daily for 7 days. 14 capsule Barrett Henle, MD   gentamicin (GARAMYCIN) 0.3 % ophthalmic solution Place 2 drops into the left eye 3 (three) times daily for 5 days. 5 mL Barrett Henle, MD   guaiFENesin-codeine [facility]) 100-10 MG/5ML syrup Take 5 mLs by mouth 4 (four) times daily as  needed for cough. 120 mL Barrett Henle, MD      I have reviewed the PDMP during this encounter.   Barrett Henle, MD 03/04/22 1428    Barrett Henle, MD 03/04/22 [phone]

## 2022-03-04 NOTE — ED Triage Notes (Addendum)
Onset 10/31 of left eye, chills, body aches, flu like symptoms.  Cough, sore throat and phlegm worsen at night.  Left eye is still red and at night is matted.  Last night left ear started throbbing  Has taken mucinex   Did a virtual visit on Friday.  Was prescribed pills for cough, but no help.  Has been using a neti pot.  Patient reports phlegm gets to the throat, but cannot cough out.    Covid test on Wednesday was negative

## 2022-03-05 LAB — SARS CORONAVIRUS 2 (TAT 6-24 HRS): SARS Coronavirus 2: NEGATIVE

## 2022-03-13 ENCOUNTER — Ambulatory Visit: Payer: Managed Care, Other (non HMO)

## 2022-03-19 ENCOUNTER — Other Ambulatory Visit: Payer: Self-pay | Admitting: Internal Medicine

## 2022-03-19 DIAGNOSIS — E042 Nontoxic multinodular goiter: Secondary | ICD-10-CM

## 2022-03-27 ENCOUNTER — Other Ambulatory Visit: Payer: Managed Care, Other (non HMO)

## 2022-04-04 ENCOUNTER — Other Ambulatory Visit: Payer: Managed Care, Other (non HMO)

## 2022-06-13 ENCOUNTER — Other Ambulatory Visit: Payer: Self-pay | Admitting: Hematology and Oncology

## 2022-06-13 DIAGNOSIS — Z9889 Other specified postprocedural states: Secondary | ICD-10-CM

## 2022-06-13 DIAGNOSIS — Z1231 Encounter for screening mammogram for malignant neoplasm of breast: Secondary | ICD-10-CM

## 2022-07-17 ENCOUNTER — Encounter: Payer: Self-pay | Admitting: Nurse Practitioner

## 2022-07-17 ENCOUNTER — Ambulatory Visit (INDEPENDENT_AMBULATORY_CARE_PROVIDER_SITE_OTHER): Payer: Managed Care, Other (non HMO) | Admitting: Nurse Practitioner

## 2022-07-17 VITALS — BP 102/70 | HR 81 | Temp 98.3°F | Ht 65.5 in | Wt 135.2 lb

## 2022-07-17 DIAGNOSIS — E782 Mixed hyperlipidemia: Secondary | ICD-10-CM | POA: Diagnosis not present

## 2022-07-17 DIAGNOSIS — I10 Essential (primary) hypertension: Secondary | ICD-10-CM | POA: Diagnosis not present

## 2022-07-17 DIAGNOSIS — R0981 Nasal congestion: Secondary | ICD-10-CM | POA: Diagnosis not present

## 2022-07-17 DIAGNOSIS — U071 COVID-19: Secondary | ICD-10-CM

## 2022-07-17 DIAGNOSIS — Z0001 Encounter for general adult medical examination with abnormal findings: Secondary | ICD-10-CM | POA: Diagnosis not present

## 2022-07-17 DIAGNOSIS — Z853 Personal history of malignant neoplasm of breast: Secondary | ICD-10-CM

## 2022-07-17 DIAGNOSIS — Z0000 Encounter for general adult medical examination without abnormal findings: Secondary | ICD-10-CM

## 2022-07-17 DIAGNOSIS — Z131 Encounter for screening for diabetes mellitus: Secondary | ICD-10-CM

## 2022-07-17 LAB — POC SOFIA 2 FLU + SARS ANTIGEN FIA
Influenza A, POC: NEGATIVE
Influenza B, POC: NEGATIVE
SARS Coronavirus 2 Ag: POSITIVE — AB

## 2022-07-17 MED ORDER — LABETALOL HCL 100 MG PO TABS: 100.0000 mg | ORAL_TABLET | Freq: Two times a day (BID) | ORAL | 1 refills | Status: DC

## 2022-07-17 MED ORDER — CHLORTHALIDONE 25 MG PO TABS: 25.0000 mg | ORAL_TABLET | Freq: Every day | ORAL | 1 refills | Status: DC

## 2022-07-17 NOTE — Progress Notes (Signed)
I,Sheena H Holbrook,acting as a Education administrator for [name], FNP.,have documented all relevant documentation on the behalf of [name], FNP,as directed by  [name], FNP while in the presence of [name], [place].   Subjective:     Patient ID: Katrina Liu , female    DOB: 07-25-1960 , 62 y.o.   MRN: [id number]   Chief Complaint  Patient presents with   Annual Exam    HPI  Patient presents today for annual exam.  She has seen her GYN - Dr. Garwin Brothers.  She had her 2nd cortisone injection to her right hand for carpal tunnel. She may have surgery if severe again She has an appt with Oncologist in July.  She has seen a Surgeon for the fat necrosis removal - thought may come back and more pain.       Past Medical History:  Diagnosis Date   Anemia    Breast cancer ([place]) 10/02/2018   right breast lumpectomy   Family history of prostate cancer    GERD (gastroesophageal reflux disease)    h/o - not now   Headache(784.0)    Hypertension    Personal history of chemotherapy    Personal history of radiation therapy    S/P abdominal hysterectomy 04/17/2011   Stroke ([place])    no residual     Family History  Problem Relation Age of Onset   Cancer Father    Stroke Father 59   Lung cancer Father    Prostate cancer Paternal Uncle        dx 34's?   Diabetes Maternal Grandfather    Breast cancer Neg Hx      Current Outpatient Medications:    aspirin EC 81 MG EC tablet, Take 1 tablet (81 mg total) by mouth daily., Disp: 90 tablet, Rfl: 0   calcium carbonate (OSCAL) 1500 (600 Ca) MG TABS tablet, Take by mouth 2 (two) times daily with a meal., Disp: , Rfl:    Cholecalciferol (VITAMIN D3) 50 MCG (2000 UT) TABS, Take by mouth., Disp: , Rfl:    Cyanocobalamin (VITAMIN B-12 CR PO), Take by mouth. 1028mcg, Disp: , Rfl:    gabapentin (NEURONTIN) 100 MG capsule, Take 1-3 capsules at night as needed, Disp: , Rfl:    mometasone (ELOCON) 0.1 % ointment, Apply 1 application topically as  needed (dry skin). , Disp: , Rfl:    Multiple Vitamin (MULTIVITAMIN ADULT PO), Take by mouth., Disp: , Rfl:    potassium chloride SA (KLOR-CON M) 20 MEQ tablet, Take 1 tablet (20 mEq total) by mouth daily., Disp: 90 tablet, Rfl: 1   vitamin E 180 MG (400 UNITS) capsule, Take 400 Units by mouth daily., Disp: , Rfl:    chlorthalidone (HYGROTON) 25 MG tablet, Take 1 tablet (25 mg total) by mouth daily., Disp: 90 tablet, Rfl: 1   diclofenac Sodium (VOLTAREN) 1 % GEL, Apply 1 application topically 4 (four) times daily. (Patient not taking: Reported on 07/17/2022), Disp: , Rfl:    guaiFENesin-codeine (CHERATUSSIN AC) 100-10 MG/5ML syrup, Take 5 mLs by mouth 4 (four) times daily as needed for cough. (Patient not taking: Reported on 07/17/2022), Disp: 120 mL, Rfl: 0   labetalol (NORMODYNE) 100 MG tablet, Take 1 tablet (100 mg total) by mouth 2 (two) times daily., Disp: 180 tablet, Rfl: 1   Allergies  Allergen Reactions   Lisinopril Swelling      The patient states she is status post hysterectomy.  Patient's last menstrual period was 04/16/2011.  Negative for: breast discharge, breast lump(s), breast pain and breast self exam. Associated symptoms include abnormal vaginal bleeding. Pertinent negatives include abnormal bleeding (hematology), anxiety, decreased libido, depression, difficulty falling sleep, dyspareunia, history of infertility, nocturia, sexual dysfunction, sleep disturbances, urinary incontinence, urinary urgency, vaginal discharge and vaginal itching. Diet regular. The patient states her exercise level is moderate 3-5 days a week. She was averaging 12,000 steps a day   The patient's tobacco use is:  Social History   Tobacco Use  Smoking Status Never  Smokeless Tobacco Never   She has been exposed to passive smoke. The patient's alcohol use is:  Social History   Substance and Sexual Activity  Alcohol Use No   Alcohol/week: 0.0 standard drinks of alcohol    Review of Systems   Constitutional: Negative.   HENT: Negative.         She has congestion in her throat area.   Eyes: Negative.   Respiratory: Negative.  Negative for shortness of breath and wheezing.   Cardiovascular: Negative.   Gastrointestinal: Negative.   Endocrine: Negative.   Genitourinary: Negative.   Musculoskeletal: Negative.        Right knee ache.   Skin: Negative.   Allergic/Immunologic: Negative.   Neurological: Negative.  Negative for headaches.  Hematological: Negative.   Psychiatric/Behavioral: Negative.       Today's Vitals   07/17/22 0902  BP: 102/70  Pulse: 81  Temp: 98.3 F (36.8 C)  TempSrc: Oral  SpO2: 98%  Weight: 135 lb 3.2 oz (61.3 kg)  Height: 5' 5.5" (1.664 m)   Body mass index is 22.16 kg/m.   Objective:  Physical Exam Vitals reviewed.  Constitutional:      General: She is not in acute distress.    Appearance: Normal appearance. She is well-developed.  HENT:     Head: Normocephalic and atraumatic.     Right Ear: Hearing, tympanic membrane, ear canal and external ear normal. There is no impacted cerumen.     Left Ear: Hearing, tympanic membrane, ear canal and external ear normal. There is no impacted cerumen.     Nose: Nose normal.     Mouth/Throat:     Mouth: Mucous membranes are moist.  Eyes:     General: Lids are normal.     Extraocular Movements: Extraocular movements intact.     Conjunctiva/sclera: Conjunctivae normal.     Pupils: Pupils are equal, round, and reactive to light.     Funduscopic exam:    Right eye: No papilledema.        Left eye: No papilledema.  Neck:     Thyroid: No thyroid mass.     Vascular: No carotid bruit.  Cardiovascular:     Rate and Rhythm: Normal rate and regular rhythm.     Pulses: Normal pulses.     Heart sounds: Normal heart sounds. No murmur heard. Pulmonary:     Effort: Pulmonary effort is normal. No respiratory distress.     Breath sounds: Normal breath sounds. No wheezing.  Chest:     Chest wall: No  mass.  Breasts:    Tanner Score is 5.     Right: Normal. No mass or tenderness.     Left: Normal. No mass or tenderness.       Comments: Tenderness to right outer lateral breast.  Abdominal:     General: Abdomen is flat. Bowel sounds are normal. There is no distension.     Palpations: Abdomen is soft.  Tenderness: There is no abdominal tenderness.  Musculoskeletal:        General: No swelling. Normal range of motion.     Cervical back: Full passive range of motion without pain, normal range of motion and neck supple.     Right lower leg: No edema.     Left lower leg: No edema.  Lymphadenopathy:     Upper Body:     Right upper body: No supraclavicular, axillary or pectoral adenopathy.     Left upper body: No supraclavicular, axillary or pectoral adenopathy.  Skin:    General: Skin is warm and dry.     Capillary Refill: Capillary refill takes less than 2 seconds.  Neurological:     General: No focal deficit present.     Mental Status: She is alert and oriented to person, place, and time.     Cranial Nerves: No cranial nerve deficit.     Sensory: No sensory deficit.  Psychiatric:        Mood and Affect: Mood normal.        Behavior: Behavior normal.        Thought Content: Thought content normal.        Judgment: Judgment normal.         Assessment And Plan:     1. Encounter for health maintenance examination Behavior modifications discussed and diet history reviewed.   Pt will continue to exercise regularly and modify diet with low GI, plant based foods and decrease intake of processed foods.  Recommend intake of daily multivitamin, Vitamin D, and calcium.  Recommend mammogram and colonoscopy for preventive screenings, as well as recommend immunizations that include influenza, TDAP, and Shingles (hold this visit due to having covid)  2. Encounter for screening for diabetes mellitus - Hemoglobin A1c  3. Primary hypertension Comments: Blood pressure is well  controlled. Continue current medications. - EKG 12-Lead - CBC - CMP14+EGFR - labetalol (NORMODYNE) 100 MG tablet; Take 1 tablet (100 mg total) by mouth 2 (two) times daily.  Dispense: 180 tablet; Refill: 1 - chlorthalidone (HYGROTON) 25 MG tablet; Take 1 tablet (25 mg total) by mouth daily.  Dispense: 90 tablet; Refill: 1  4. Mixed hyperlipidemia Comments: Cholesterol levels are normal. - Lipid panel  5. History of breast cancer Comments: Tenderness to right lateral breast  6. Nasal congestion Comments: She is to use steroid nasal spray as needed. - POC SOFIA 2 FLU + SARS ANTIGEN FIA  7. COVID-19 Comments: Positive rapid Covid. Out of work until Thursday. Advised patient to take Vitamin C, D, Zinc.  Keep yourself hydrated with a lot of water and rest. Take Delsym for cough and Mucinex as need. Take Tylenol or pain reliever every 4-6 hours as needed for pain/fever/body ache. If you have elevated blood pressure, you can take OTC Corcidin. You can also take OTC oscillococcinum to help with your symptoms.  Educated patient if symptoms get worse or if she experiences any SOB, chest pain or pain in her legs to seek immediate emergency care. Continue to monitor your oxygen levels. Call us if you have any questions. Quarantine for 5 days if tested positive and no symptoms or 10 days if tested positive and have symptoms. Wear a mask around other people.     Patient was given opportunity to ask questions. Patient verbalized understanding of the plan and was able to repeat key elements of the plan. All questions were answered to their satisfaction.   [name], FNP   I, Doreene Burke  Laurance Flatten, [place], have reviewed all documentation for this visit. The documentation on 07/17/22 for the exam, diagnosis, procedures, and orders are all accurate and complete.   THE PATIENT IS ENCOURAGED TO PRACTICE SOCIAL DISTANCING DUE TO THE COVID-19 PANDEMIC.

## 2022-07-17 NOTE — Patient Instructions (Signed)
Advised patient to take Vitamin C, D, Zinc.  Keep yourself hydrated with a lot of water and rest. Take Delsym for cough and Mucinex as need. Take Tylenol or pain reliever every 4-6 hours as needed for pain/fever/body ache. If you have elevated blood pressure, you can take OTC Corcidin. You can also take OTC oscillococcinum to help with your symptoms.  Educated patient if symptoms get worse or if she experiences any SOB, chest pain or pain in her legs to seek immediate emergency care. Continue to monitor your oxygen levels. Call us if you have any questions. Quarantine for 5 days if tested positive and no symptoms or 10 days if tested positive and have symptoms. Wear a mask around other people.

## 2022-07-26 ENCOUNTER — Other Ambulatory Visit: Payer: Managed Care, Other (non HMO)

## 2022-07-30 ENCOUNTER — Other Ambulatory Visit: Payer: Self-pay | Admitting: Nurse Practitioner

## 2022-07-30 ENCOUNTER — Encounter: Payer: Self-pay | Admitting: Nurse Practitioner

## 2022-07-30 DIAGNOSIS — E2839 Other primary ovarian failure: Secondary | ICD-10-CM

## 2022-08-02 ENCOUNTER — Ambulatory Visit
Admission: RE | Admit: 2022-08-02 | Discharge: 2022-08-02 | Disposition: A | Discharge status: Home / Self Care | Payer: Managed Care, Other (non HMO) | Source: Ambulatory Visit | Attending: Hematology and Oncology | Admitting: Hematology and Oncology

## 2022-08-02 DIAGNOSIS — Z9889 Other specified postprocedural states: Secondary | ICD-10-CM

## 2022-08-08 ENCOUNTER — Ambulatory Visit
Admission: RE | Admit: 2022-08-08 | Discharge: 2022-08-08 | Disposition: A | Discharge status: Home / Self Care | Payer: Managed Care, Other (non HMO) | Source: Ambulatory Visit | Attending: Internal Medicine | Admitting: Internal Medicine

## 2022-08-08 DIAGNOSIS — E042 Nontoxic multinodular goiter: Secondary | ICD-10-CM

## 2022-08-16 ENCOUNTER — Other Ambulatory Visit: Payer: Self-pay | Admitting: Nurse Practitioner

## 2022-08-16 DIAGNOSIS — E042 Nontoxic multinodular goiter: Secondary | ICD-10-CM

## 2022-08-16 NOTE — Addendum Note (Signed)
Addended by: Arnette Felts F on: 08/16/2022 12:43 PM   Modules accepted: Level of Service

## 2022-09-19 ENCOUNTER — Other Ambulatory Visit: Payer: Self-pay | Admitting: Hematology and Oncology

## 2022-10-14 ENCOUNTER — Ambulatory Visit (HOSPITAL_COMMUNITY): Payer: Managed Care, Other (non HMO)

## 2022-10-15 ENCOUNTER — Other Ambulatory Visit: Payer: Self-pay | Admitting: Nurse Practitioner

## 2022-10-15 DIAGNOSIS — I10 Essential (primary) hypertension: Secondary | ICD-10-CM

## 2022-10-16 ENCOUNTER — Inpatient Hospital Stay: Admission: RE | Admit: 2022-10-16 | Payer: Managed Care, Other (non HMO) | Source: Ambulatory Visit

## 2022-11-05 NOTE — Progress Notes (Signed)
Patient Care Team: Arnette Felts, FNP as PCP - General (General Practice) Serena Croissant, MD as Consulting Physician (Hematology and Oncology) Dorothy Puffer, MD as Consulting Physician (Radiation Oncology) Harriette Bouillon, MD as Consulting Physician (General Surgery)  DIAGNOSIS: No diagnosis found.  SUMMARY OF ONCOLOGIC HISTORY: Oncology History  Malignant neoplasm of upper-outer quadrant of right breast in female, estrogen receptor negative (HCC)  05/29/2018 Initial Diagnosis   Right axillary pain which led to a mammogram and ultrasound that revealed a new mass in the superior UOQ tail of the breast 10 o'clock position by ultrasound measured 8 mm, axilla negative, biopsy revealed grade 3 IDC triple negative with a Ki-67 of 20%, T1BN0 stage Ib clinical stage   06/04/2018 Cancer Staging   Staging form: Breast, AJCC 8th Edition - Clinical stage from 06/04/2018: Stage IB (cT1b, cN0, cM0, G3, ER-, PR-, HER2-) - Signed by Serena Croissant, MD on 06/04/2018   06/17/2018 -  Neo-Adjuvant Chemotherapy   Dose dense Adriamycin and Cytoxan x4 followed by Taxol and carboplatin   07/15/2018 Genetic Testing   No pathogenic variants identified (negative testing) on the [facility] CancerNext+RNAinsight panel. The CancerNext gene panel offered by [employer] includes sequencing and rearrangement analysis for the following 34 genes:   APC, ATM, BARD1, BMPR1A, BRCA1, BRCA2, BRIP1, CDH1, CDK4, CDKN2A, CHEK2, DICER1, EPCAM, GREM1, HOXB13MLH1, MRE11A, MSH2, MSH6, MUTYH, NBN, NF1, PALB2, PMS2, POLD1, POLE, PTEN, RAD50, RAD51D, RAD51C, SMAD4, SMARCA4, STK11, and TP53. The report date is 07/15/2018.   10/02/2018 Surgery   Right Lumpectomy ([facility]): no residual carcinoma s/p neoadjuvant treatment, 0/3 lymph nodes negative for carcinoma.    11/06/2018 - 12/22/2018 Radiation Therapy   The patient initially received a dose of 50.4 Gy in 28 fractions to the breast using whole-breast tangent fields. This was delivered using a 3-D  conformal technique. The patient then received a boost to the seroma. This delivered an additional 10 Gy in 5 fractions using a electron boost with electrons. The total dose was 60.4 Gy.     CHIEF COMPLIANT: Surveillance of triple negative right breast cancer   INTERVAL HISTORY: Katrina Liu is a 62 y.o. with above-mentioned history of triple negative right breast cancer who completed neoadjuvant chemotherapy, lumpectomy, radiation, and is currently on surveillance. She presents to the clinic today for a follow-up.    ALLERGIES:  is allergic to lisinopril.  MEDICATIONS:  Current Outpatient Medications  Medication Sig Dispense Refill   aspirin EC 81 MG EC tablet Take 1 tablet (81 mg total) by mouth daily. 90 tablet 0   calcium carbonate (OSCAL) 1500 (600 Ca) MG TABS tablet Take by mouth 2 (two) times daily with a meal.     chlorthalidone (HYGROTON) 25 MG tablet Take 1 tablet (25 mg total) by mouth daily. 90 tablet 1   Cholecalciferol (VITAMIN D3) 50 MCG (2000 UT) TABS Take by mouth.     Cyanocobalamin (VITAMIN B-12 CR PO) Take by mouth.     diclofenac Sodium (VOLTAREN) 1 % GEL Apply 1 application topically 4 (four) times daily. (Patient not taking: Reported on 07/17/2022)     gabapentin (NEURONTIN) 100 MG capsule Take 1-3 capsules at night as needed     guaiFENesin-codeine (CHERATUSSIN AC) 100-10 MG/5ML syrup Take 5 mLs by mouth 4 (four) times daily as needed for cough. (Patient not taking: Reported on 07/17/2022) 120 mL 0   KLOR-CON M20 20 MEQ tablet TAKE 1 TABLET BY MOUTH DAILY 90 tablet 1   labetalol (NORMODYNE) 100 MG tablet Take  1 tablet (100 mg total) by mouth 2 (two) times daily. 180 tablet 1   mometasone (ELOCON) 0.1 % ointment Apply 1 application topically as needed (dry skin).      Multiple Vitamin (MULTIVITAMIN ADULT PO) Take by mouth.     vitamin E 180 MG (400 UNITS) capsule Take 400 Units by mouth daily.     No current facility-administered medications for this  visit.    PHYSICAL EXAMINATION: ECOG PERFORMANCE STATUS: {CHL ONC ECOG PS:[phone]}  There were no vitals filed for this visit. There were no vitals filed for this visit.  BREAST:*** No palpable masses or nodules in either right or left breasts. No palpable axillary supraclavicular or infraclavicular adenopathy no breast tenderness or nipple discharge. (exam performed in the presence of a chaperone)  LABORATORY DATA:  I have reviewed the data as listed    Latest Ref Rng & Units 01/08/2022    9:13 AM 07/05/2021   11:25 AM 01/30/2021   10:49 AM  CMP  Glucose 70 - 99 mg/dL 98  90  90   BUN 8 - 27 mg/dL 11  14  14    Creatinine 0.57 - 1.00 mg/dL 1.61  0.96  0.45   Sodium 134 - 144 mmol/L 139  145  142   Potassium 3.5 - 5.2 mmol/L 3.3  3.7  3.7   Chloride 96 - 106 mmol/L 98  104  99   CO2 20 - 29 mmol/L 27  25  28    Calcium 8.7 - 10.3 mg/dL 9.6  40.9  81.1   Total Protein 6.0 - 8.5 g/dL 7.0  6.8    Total Bilirubin 0.0 - 1.2 mg/dL 0.6  0.6    Alkaline Phos 44 - 121 IU/L 73  84    AST 0 - 40 IU/L 23  19    ALT 0 - 32 IU/L 16  15      Lab Results  Component Value Date   WBC 3.3 (L) 07/05/2021   HGB 14.1 07/05/2021   HCT 41.2 07/05/2021   MCV 92 07/05/2021   PLT 224 07/05/2021   NEUTROABS 1.4 (L) 09/29/2018    ASSESSMENT & PLAN:  No problem-specific Assessment & Plan notes found for this encounter.    No orders of the defined types were placed in this encounter.  The patient has a good understanding of the overall plan. she agrees with it. she will call with any problems that may develop before the next visit here. Total time spent: 30 mins including face to face time and time spent for planning, charting and co-ordination of care   Sherlyn Lick, CMA 11/05/22    I Janan Ridge am acting as a Neurosurgeon for [employer]  ***

## 2022-11-08 ENCOUNTER — Inpatient Hospital Stay: Payer: Managed Care, Other (non HMO) | Attending: Hematology and Oncology | Admitting: Hematology and Oncology

## 2022-11-08 ENCOUNTER — Other Ambulatory Visit: Payer: Self-pay

## 2022-11-08 VITALS — BP 118/84 | HR 66 | Temp 97.5°F | Resp 18 | Ht 65.5 in | Wt 139.2 lb

## 2022-11-08 DIAGNOSIS — Z79899 Other long term (current) drug therapy: Secondary | ICD-10-CM | POA: Diagnosis not present

## 2022-11-08 DIAGNOSIS — Z7982 Long term (current) use of aspirin: Secondary | ICD-10-CM | POA: Diagnosis not present

## 2022-11-08 DIAGNOSIS — Z853 Personal history of malignant neoplasm of breast: Secondary | ICD-10-CM | POA: Diagnosis present

## 2022-11-08 DIAGNOSIS — Z171 Estrogen receptor negative status [ER-]: Secondary | ICD-10-CM

## 2022-11-08 DIAGNOSIS — C50411 Malignant neoplasm of upper-outer quadrant of right female breast: Secondary | ICD-10-CM | POA: Diagnosis not present

## 2022-11-08 DIAGNOSIS — Z923 Personal history of irradiation: Secondary | ICD-10-CM | POA: Insufficient documentation

## 2022-11-08 DIAGNOSIS — Z9221 Personal history of antineoplastic chemotherapy: Secondary | ICD-10-CM | POA: Diagnosis not present

## 2022-11-08 NOTE — Assessment & Plan Note (Addendum)
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 Neoadjuvant chemotherapy with Adriamycin and Cytoxan dose dense 4 followed by Taxol weekly 2 and carboplatin every 3 weeks ( Discontinued due to toxicities) 2. 10/02/2018:Right Lumpectomy ([facility]): no residual carcinoma s/p neoadjuvant treatment, 0/3 lymph nodes negative for carcinoma. (Path CR) 3. Followed by adjuvant radiation therapy completed 12/22/2018 Genetics negative ---------------------------------------------------------------------------------------------------------------------------------- Breast Cancer Surveillance:  1. Mammograms 08/02/2022: Benign (December 2021: Biopsy of the right breast nodule: Fat necrosis) breast density category B 2. Breast Exam: 11/08/2022: Benign Bone density scheduled for 02/13/2023 Ultrasound thyroid 08/08/2022: Nodule measuring 2 cm right thyroid lobe: The recommend FNA.  Will order this.   Mild peripheral neuropathy: Not significant    Her grandson is now  37 years old and she had another grandchild in January 2024. Survivorship: She is walking 10,000 steps per day and staying active.   Tenderness in the right breast surgical scar: Severe tenderness at that spot which does not appear to be getting better.  She is applying CBD oil which appears to be helping it slightly.  RTC in 1 year

## 2022-11-09 ENCOUNTER — Telehealth: Payer: Self-pay | Admitting: Hematology and Oncology

## 2022-11-09 NOTE — Telephone Encounter (Signed)
Scheduled appointment per 7/11 los. Patient is aware of the made appointment.

## 2022-11-19 ENCOUNTER — Other Ambulatory Visit: Payer: Managed Care, Other (non HMO)

## 2022-11-20 ENCOUNTER — Encounter: Payer: Self-pay | Admitting: Nurse Practitioner

## 2022-11-20 ENCOUNTER — Ambulatory Visit (INDEPENDENT_AMBULATORY_CARE_PROVIDER_SITE_OTHER): Payer: Managed Care, Other (non HMO) | Admitting: Nurse Practitioner

## 2022-11-20 VITALS — BP 120/80 | HR 73 | Temp 98.0°F | Ht 65.5 in | Wt 138.2 lb

## 2022-11-20 DIAGNOSIS — I10 Essential (primary) hypertension: Secondary | ICD-10-CM

## 2022-11-20 DIAGNOSIS — E782 Mixed hyperlipidemia: Secondary | ICD-10-CM | POA: Diagnosis not present

## 2022-11-20 DIAGNOSIS — Z79899 Other long term (current) drug therapy: Secondary | ICD-10-CM | POA: Diagnosis not present

## 2022-11-20 MED ORDER — CHLORTHALIDONE 25 MG PO TABS: 25.0000 mg | ORAL_TABLET | Freq: Every day | ORAL | 1 refills | Status: DC

## 2022-11-20 MED ORDER — POTASSIUM CHLORIDE CRYS ER 20 MEQ PO TBCR: 20.0000 meq | EXTENDED_RELEASE_TABLET | Freq: Every day | ORAL | 1 refills | Status: DC

## 2022-11-20 MED ORDER — LABETALOL HCL 100 MG PO TABS: 100.0000 mg | ORAL_TABLET | Freq: Two times a day (BID) | ORAL | 1 refills | Status: DC

## 2022-11-20 NOTE — Progress Notes (Signed)
I,Jameka J Llittleton, CMA,acting as a Neurosurgeon for [employer], FNP.,have documented all relevant documentation on the behalf of Arnette Felts, FNP,as directed by  Arnette Felts, FNP while in the presence of Arnette Felts, FNP.  Subjective:  Patient ID: Katrina Liu , female    DOB: 07-28-1960 , 62 y.o.   MRN: [id number]  Chief Complaint  Patient presents with   Hypertension   Hyperlipidemia    HPI  The patient is here for a blood pressure and cholesterol f/u. Patient reports compliance with her meds. She had carpal tunnel surgery. Continues to have the fat necrosis and has seen oncology, he is going to check for a nerve provider for injection. Continues to walk when she can 4-6 miles.   She is to have a thyroid biopsy on 11/26/2022.   Wt Readings from Last 3 Encounters: 11/20/22 : 138 lb 3.2 oz (62.7 kg) 11/08/22 : 139 lb 3.2 oz (63.1 kg) 07/17/22 : 135 lb 3.2 oz (61.3 kg)    Hypertension This is a chronic problem. The current episode started more than 1 year ago. The problem is unchanged. The problem is controlled. Pertinent negatives include no anxiety, headaches or shortness of breath. Past treatments include diuretics. There are no compliance problems.  There is no history of angina. There is no history of chronic renal disease.     Past Medical History:  Diagnosis Date   Anemia    Breast cancer (HCC) 10/02/2018   right breast lumpectomy   Family history of prostate cancer    GERD (gastroesophageal reflux disease)    h/o - not now   Headache(784.0)    Hypertension    Personal history of chemotherapy    Personal history of radiation therapy    S/P abdominal hysterectomy 04/17/2011   Stroke (HCC)    no residual     Family History  Problem Relation Age of Onset   Cancer Father    Stroke Father 13   Lung cancer Father    Prostate cancer Paternal Uncle        dx 2's?   Diabetes Maternal Grandfather    Breast cancer Neg Hx      Current Outpatient Medications:     aspirin EC 81 MG EC tablet, Take 1 tablet (81 mg total) by mouth daily., Disp: 90 tablet, Rfl: 0   calcium carbonate (OSCAL) 1500 (600 Ca) MG TABS tablet, Take by mouth 2 (two) times daily with a meal., Disp: , Rfl:    Cholecalciferol (VITAMIN D3) 50 MCG (2000 UT) TABS, Take by mouth., Disp: , Rfl:    Cyanocobalamin (VITAMIN B-12 CR PO), Take by mouth. , Disp: , Rfl:    gabapentin (NEURONTIN) 100 MG capsule, Take 1-3 capsules at night as needed, Disp: , Rfl:    mometasone (ELOCON) 0.1 % ointment, Apply 1 application topically as needed (dry skin). , Disp: , Rfl:    Multiple Vitamin (MULTIVITAMIN ADULT PO), Take by mouth., Disp: , Rfl:    vitamin E 180 MG (400 UNITS) capsule, Take 400 Units by mouth daily., Disp: , Rfl:    chlorthalidone (HYGROTON) 25 MG tablet, Take 1 tablet (25 mg total) by mouth daily., Disp: 90 tablet, Rfl: 1   labetalol (NORMODYNE) 100 MG tablet, Take 1 tablet (100 mg total) by mouth 2 (two) times daily., Disp: 180 tablet, Rfl: 1   potassium chloride SA (KLOR-CON M20) 20 MEQ tablet, Take 1 tablet (20 mEq total) by mouth daily., Disp: 90 tablet, Rfl: 1  Allergies  Allergen Reactions   Lisinopril Swelling     Review of Systems  Constitutional: Negative.   Eyes: Negative.   Respiratory:  Negative for shortness of breath.   Musculoskeletal: Negative.   Skin: Negative.   Neurological:  Negative for headaches.  Psychiatric/Behavioral: Negative.       Today's Vitals   11/20/22 0834 11/20/22 0928  BP: (!) 140/80 120/80  Pulse: 73   Temp: 98 F (36.7 C)   Weight: 138 lb 3.2 oz (62.7 kg)   Height: 5' 5.5" (1.664 m)   PainSc: 0-No pain    Body mass index is 22.65 kg/m.  Wt Readings from Last 3 Encounters:  11/20/22 138 lb 3.2 oz (62.7 kg)  11/08/22 139 lb 3.2 oz (63.1 kg)  07/17/22 135 lb 3.2 oz (61.3 kg)      Objective:  Physical Exam Vitals reviewed.  Constitutional:      General: She is not in acute distress.    Appearance: Normal appearance.   Cardiovascular:     Rate and Rhythm: Normal rate and regular rhythm.     Pulses: Normal pulses.     Heart sounds: Normal heart sounds. No murmur heard. Pulmonary:     Effort: Pulmonary effort is normal. No respiratory distress.     Breath sounds: Normal breath sounds. No wheezing.  Skin:    General: Skin is warm and dry.     Capillary Refill: Capillary refill takes less than 2 seconds.  Neurological:     General: No focal deficit present.     Mental Status: She is alert.     Cranial Nerves: No cranial nerve deficit.     Motor: No weakness.  Psychiatric:        Mood and Affect: Mood normal.        Behavior: Behavior normal.        Thought Content: Thought content normal.        Judgment: Judgment normal.         Assessment And Plan:  Primary hypertension Assessment & Plan: Blood pressure is slightly elevated repeat is improved, continue current medications  Orders: -     Basic metabolic panel -     Chlorthalidone; Take 1 tablet (25 mg total) by mouth daily.  Dispense: 90 tablet; Refill: 1 -     Potassium Chloride Crys ER; Take 1 tablet (20 mEq total) by mouth daily.  Dispense: 90 tablet; Refill: 1 -     Labetalol HCl; Take 1 tablet (100 mg total) by mouth 2 (two) times daily.  Dispense: 180 tablet; Refill: 1  Mixed hyperlipidemia Assessment & Plan: Cholesterol levels were stable, continue low fat diet   Orders: -     Lipid panel  Other long term (current) drug therapy -     CBC   Return in about 4 months (around 03/23/2023) for bp and chol check.  Patient was given opportunity to ask questions. Patient verbalized understanding of the plan and was able to repeat key elements of the plan. All questions were answered to their satisfaction.    Jeanell Sparrow, FNP, have reviewed all documentation for this visit. The documentation on 11/20/22 for the exam, diagnosis, procedures, and orders are all accurate and complete.   IF YOU HAVE BEEN REFERRED TO A SPECIALIST, IT  MAY TAKE 1-2 WEEKS TO SCHEDULE/PROCESS THE REFERRAL. IF YOU HAVE NOT HEARD FROM US/SPECIALIST IN TWO WEEKS, PLEASE GIVE [place] A CALL AT [phone].

## 2022-11-20 NOTE — Assessment & Plan Note (Signed)
Blood pressure is slightly elevated repeat is improved, continue current medications

## 2022-11-20 NOTE — Assessment & Plan Note (Signed)
Cholesterol levels were stable, continue low fat diet

## 2022-11-21 LAB — CBC
Hematocrit: 40.8 % (ref 34.0–46.6)
Hemoglobin: 14 g/dL (ref 11.1–15.9)
MCH: 32 pg (ref 26.6–33.0)
MCHC: 34.3 g/dL (ref 31.5–35.7)
MCV: 93 fL (ref 79–97)
Platelets: 225 10*3/uL (ref 150–450)
RBC: 4.38 x10E6/uL (ref 3.77–5.28)
RDW: 12.2 % (ref 11.7–15.4)
WBC: 3.2 10*3/uL — ABNORMAL LOW (ref 3.4–10.8)

## 2022-11-21 LAB — BASIC METABOLIC PANEL
BUN/Creatinine Ratio: 15 (ref 12–28)
BUN: 13 mg/dL (ref 8–27)
CO2: 30 mmol/L — ABNORMAL HIGH (ref 20–29)
Calcium: 10 mg/dL (ref 8.7–10.3)
Chloride: 99 mmol/L (ref 96–106)
Creatinine, Ser: 0.86 mg/dL (ref 0.57–1.00)
Glucose: 67 mg/dL — ABNORMAL LOW (ref 70–99)
Potassium: 3.9 mmol/L (ref 3.5–5.2)
Sodium: 142 mmol/L (ref 134–144)
eGFR: 76 mL/min/{1.73_m2} (ref >59)

## 2022-11-21 LAB — LIPID PANEL
Chol/HDL Ratio: 2.7 ratio (ref 0.0–4.4)
Cholesterol, Total: 203 mg/dL — ABNORMAL HIGH (ref 100–199)
HDL: 76 mg/dL (ref >39)
LDL Chol Calc (NIH): 111 mg/dL — ABNORMAL HIGH (ref 0–99)
Triglycerides: 89 mg/dL (ref 0–149)
VLDL Cholesterol Cal: 16 mg/dL (ref 5–40)

## 2022-11-26 ENCOUNTER — Ambulatory Visit
Admission: RE | Admit: 2022-11-26 | Discharge: 2022-11-26 | Disposition: A | Discharge status: Home / Self Care | Payer: Managed Care, Other (non HMO) | Source: Ambulatory Visit | Attending: Nurse Practitioner | Admitting: Nurse Practitioner

## 2022-11-26 ENCOUNTER — Other Ambulatory Visit (HOSPITAL_COMMUNITY)
Admission: RE | Admit: 2022-11-26 | Discharge: 2022-11-26 | Disposition: A | Discharge status: Home / Self Care | Payer: Managed Care, Other (non HMO) | Source: Ambulatory Visit | Attending: Interventional Radiology | Admitting: Interventional Radiology

## 2022-11-26 DIAGNOSIS — E042 Nontoxic multinodular goiter: Secondary | ICD-10-CM

## 2022-11-26 DIAGNOSIS — E041 Nontoxic single thyroid nodule: Secondary | ICD-10-CM | POA: Diagnosis present

## 2023-01-22 ENCOUNTER — Other Ambulatory Visit: Payer: Self-pay | Admitting: Nurse Practitioner

## 2023-01-22 DIAGNOSIS — I10 Essential (primary) hypertension: Secondary | ICD-10-CM

## 2023-01-23 ENCOUNTER — Encounter: Payer: Self-pay | Admitting: Hematology and Oncology

## 2023-01-24 ENCOUNTER — Other Ambulatory Visit: Payer: Self-pay | Admitting: *Deleted

## 2023-01-24 DIAGNOSIS — Z171 Estrogen receptor negative status [ER-]: Secondary | ICD-10-CM

## 2023-01-24 NOTE — Progress Notes (Signed)
Per MD request, referral placed to PT for right breast massage and dry needling for treatment and evaluation of right breast pain r/t to hx of right lumpectomy and right breast necrosis.

## 2023-01-30 ENCOUNTER — Other Ambulatory Visit: Payer: Self-pay

## 2023-01-30 ENCOUNTER — Encounter: Payer: Self-pay | Admitting: Physical Therapy

## 2023-01-30 ENCOUNTER — Ambulatory Visit: Payer: Managed Care, Other (non HMO) | Attending: Hematology and Oncology | Admitting: Physical Therapy

## 2023-01-30 DIAGNOSIS — I890 Lymphedema, not elsewhere classified: Secondary | ICD-10-CM | POA: Diagnosis not present

## 2023-01-30 DIAGNOSIS — L599 Disorder of the skin and subcutaneous tissue related to radiation, unspecified: Secondary | ICD-10-CM

## 2023-01-30 DIAGNOSIS — Z08 Encounter for follow-up examination after completed treatment for malignant neoplasm: Secondary | ICD-10-CM | POA: Diagnosis not present

## 2023-01-30 DIAGNOSIS — C50411 Malignant neoplasm of upper-outer quadrant of right female breast: Secondary | ICD-10-CM | POA: Insufficient documentation

## 2023-01-30 DIAGNOSIS — M25611 Stiffness of right shoulder, not elsewhere classified: Secondary | ICD-10-CM

## 2023-01-30 DIAGNOSIS — Z171 Estrogen receptor negative status [ER-]: Secondary | ICD-10-CM | POA: Diagnosis not present

## 2023-01-30 DIAGNOSIS — Z483 Aftercare following surgery for neoplasm: Secondary | ICD-10-CM

## 2023-01-30 NOTE — Therapy (Signed)
OUTPATIENT PHYSICAL THERAPY  UPPER EXTREMITY ONCOLOGY EVALUATION  Patient Name: Katrina Liu MRN: [id number] DOB:[birth year], 62 y.o., female Today's Date: 01/30/2023  END OF SESSION:  PT End of Session - 01/30/23 0953     Visit Number 1    Number of Visits 9    Date for PT Re-Evaluation 02/27/23    PT Start Time 0906    PT Stop Time 0953    PT Time Calculation (min) 47 min    Activity Tolerance Patient tolerated treatment well    Behavior During Therapy [facility] for tasks assessed/performed             Past Medical History:  Diagnosis Date   Anemia    Breast cancer (HCC) 10/02/2018   right breast lumpectomy   Family history of prostate cancer    GERD (gastroesophageal reflux disease)    h/o - not now   Headache(784.0)    Hypertension    Personal history of chemotherapy    Personal history of radiation therapy    S/P abdominal hysterectomy 04/17/2011   Stroke (HCC)    no residual   Past Surgical History:  Procedure Laterality Date   ABDOMINAL HYSTERECTOMY  04/16/2011   Procedure: HYSTERECTOMY ABDOMINAL;  Surgeon: Serita Kyle, MD;  Location: [facility];  Service: Gynecology;  Laterality: N/A;   BREAST BIOPSY Left 2013   BREAST BIOPSY Right 2020   BREAST LUMPECTOMY Right 10/02/2018   BREAST LUMPECTOMY WITH RADIOACTIVE SEED AND SENTINEL LYMPH NODE BIOPSY Right 10/02/2018   Procedure: RIGHT BREAST LUMPECTOMY WITH RADIOACTIVE SEED AND RIGHT SENTINEL LYMPH NODE MAPPING;  Surgeon: Harriette Bouillon, MD;  Location: [facility];  Service: General;  Laterality: Right;   KIDNEY STONE SURGERY     LAPAROTOMY     ectopic pregnancy   LOOP RECORDER IMPLANT N/A 12/24/2012   Procedure: LOOP RECORDER IMPLANT;  Surgeon: Duke Salvia, MD;  Location: [facility] CATH LAB;  Service: Cardiovascular;  Laterality: N/A;   LOOP RECORDER REMOVAL N/A 08/15/2016   Procedure: Loop Recorder Removal;  Surgeon: Duke Salvia, MD;  Location: [facility] INVASIVE CV LAB;  Service: Cardiovascular;   Laterality: N/A;   SHOULDER ARTHROSCOPY     TEE WITHOUT CARDIOVERSION N/A 12/24/2012   Procedure: TRANSESOPHAGEAL ECHOCARDIOGRAM (TEE);  Surgeon: Corky Crafts, MD;  Location: [facility] ENDOSCOPY;  Service: Cardiovascular;  Laterality: N/A;   Patient Active Problem List   Diagnosis Date Noted   Paroxysmal atrial fibrillation (HCC) 01/08/2022   Mixed hyperlipidemia 01/08/2022   Multinodular goiter 10/26/2020   Port-A-Cath in place 07/01/2018   Genetic testing 06/24/2018   Family history of prostate cancer    Malignant neoplasm of upper-outer quadrant of right breast in female, estrogen receptor negative (HCC) 06/02/2018   Recurrent oral ulcers 06/25/2017   Insomnia 06/03/2014   Migraine headache 12/25/2012   Cerebellar stroke syndrome 12/22/2012   Hypertension 12/22/2012   S/P abdominal hysterectomy 04/17/2011    PCP: Amada Kingfisher, FNP  REFERRING PROVIDER: Serena Croissant, MD  REFERRING DIAG: C50.411,Z17.1 (ICD-10-CM) - Malignant neoplasm of upper-outer quadrant of right breast in female, estrogen receptor negative (HCC)  THERAPY DIAG:  Malignant neoplasm of upper-outer quadrant of right breast in female, estrogen receptor negative (HCC)  Disorder of the skin and subcutaneous tissue related to radiation, unspecified  Aftercare following surgery for neoplasm  Stiffness of right shoulder, not elsewhere classified  ONSET DATE: May 2023  Rationale for Evaluation and Treatment: Rehabilitation  SUBJECTIVE:  SUBJECTIVE STATEMENT: It started out as a small area of fat necrosis. It has progressed to an area of pain with shooting pain in to the axilla and breast. I could be just sitting and a sharp pain can just shoot through. It is hard to sleep at night.  PERTINENT HISTORY: 05/29/2018 R breast cancer  grade 3 IDC triple negative with a Ki-67 of 20%, T1BN0 stage Ib clinical stage. Completed neoadjuvant chemo. 10/02/2018- Right Lumpectomy ([facility]): no residual carcinoma s/p neoadjuvant treatment, 0/3 lymph nodes negative for carcinoma. 11/06/2018 - 12/22/2018 completed radiation. Pt reports in early 2023 she felt a lump that turned out to be fat necrosis.   PAIN:  Are you having pain? Yes NPRS scale: 3/10 Pain location: R upper outer breast Pain orientation: Right  PAIN TYPE: aching and dull Pain description: constant  Aggravating factors: nothing Relieving factors: nothing  PRECAUTIONS: Other: R UE lymphedema risk  RED FLAGS: None   WEIGHT BEARING RESTRICTIONS: No  FALLS:  Has patient fallen in last 6 months? 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  COGNITION: Overall cognitive status: Within functional limits for tasks assessed   PALPATION: Area of fat necrosis palpable at R lateral outer breast, no cording palpable but could be deep  OBSERVATIONS / OTHER ASSESSMENTS: visible area of fat necrosis   POSTURE: forward head, rounded shoulders  UPPER EXTREMITY AROM/PROM:  A/PROM RIGHT   eval   Shoulder extension 70  Shoulder flexion 158  Shoulder abduction 159  Shoulder internal rotation 49  Shoulder external rotation 74    (Blank rows = not tested)  A/PROM LEFT   eval  Shoulder extension 70  Shoulder flexion 163  Shoulder abduction 165  Shoulder internal rotation 50  Shoulder external rotation  90    (Blank rows = not tested)  CERVICAL AROM: All within normal limits:    Percent limited  Flexion   Extension   Right lateral flexion   Left lateral flexion   Right rotation   Left rotation       LYMPHEDEMA ASSESSMENTS:   LANDMARK RIGHT  eval  At axilla  28  15 cm proximal to olecranon process   10 cm proximal to olecranon process 24  Olecranon process 22.4  15 cm proximal to ulnar styloid process   10 cm proximal to ulnar styloid process 17  Just proximal to ulnar styloid process 14.5  Across hand at thumb web space 18.2  At base of 2nd digit 6  (Blank rows = not tested)  LANDMARK LEFT  eval  At axilla  30  15 cm proximal to olecranon process   10 cm proximal to olecranon process 24.9  Olecranon process 23.5  15 cm proximal to ulnar styloid process   10 cm proximal to ulnar styloid process 19.6  Just proximal to ulnar styloid process 14.7  Across hand at thumb web space 19.2  At base of 2nd digit 6.2  (Blank rows = not tested)    QUICK DASH SURVEY:   Neldon Mc - 01/30/23 0001     Open  a tight or new jar No difficulty    Do heavy household chores (wash walls, wash floors) Mild difficulty    Carry a shopping bag or briefcase Mild difficulty    Wash your back Moderate difficulty    Use a knife to cut food No difficulty    Recreational activities in which you take some force or impact through your arm, shoulder, or hand (golf, hammering, tennis) Mild difficulty    During the past week, to what extent has your arm, shoulder or hand problem interfered with your normal social activities with family, friends, neighbors, or groups? Slightly    During the past week, to what extent has your arm, shoulder or hand problem limited your work or other regular daily activities Slightly    Arm, shoulder, or hand pain. Mild    Tingling (pins and needles) in your arm, shoulder, or hand Mild    Difficulty Sleeping Moderate difficulty    DASH Score 25 %                TODAY'S TREATMENT:                                                                                                                                          DATE:  01/30/23- cut 1/2 grey foam and placed in thick stockinette to wear over area of fat necrosis to see if this improves comfort    PATIENT EDUCATION:  Education details: cording, fat necrosis Person educated: Patient Education method: Explanation Education comprehension: verbalized understanding  HOME EXERCISE PROGRAM: Try wearing 1/2 grey foam over area of fat necrosis to see if this helps decrease pain  ASSESSMENT:  CLINICAL IMPRESSION: Patient is a 62 y.o. female who was seen today for physical therapy evaluation and treatment for pain and discomfort in R breast and axilla. She has an area of fat necrosis that worsened following a biopsy of the area in 2023. She reports since then her pain has been worsening and some days she yells out because it catches her off guard. It affects her ability to sleep and get comfortable. She would benefit from skilled PT services to decrease pain in R breast and decrease end range R shoulder tightness.     OBJECTIVE IMPAIRMENTS: increased fascial restrictions, postural dysfunction, and pain.   ACTIVITY LIMITATIONS: carrying, lifting, and reach over head  PARTICIPATION LIMITATIONS: cleaning, community activity, and yard work  PERSONAL FACTORS: Time since onset of injury/illness/exacerbation are also affecting patient's functional outcome.   REHAB POTENTIAL: Good  CLINICAL DECISION MAKING: Stable/uncomplicated  EVALUATION COMPLEXITY: Low  GOALS: Goals reviewed with patient? Yes  SHORT TERM GOALS=LONG TERM GOALS Target date: 02/27/23  Pt will report a 50% decrease in pain in R breast to allow improved comfort.  Baseline: Goal status: INITIAL  2.  Pt will report no tightness at end range R shoulder ROM to decrease discomfort. Baseline:  Goal status: INITIAL  3.  Pt  will be independent in a home exercise program for continued stretching and strengthening.  Baseline:  Goal status: INITIAL  PLAN:  PT FREQUENCY: 2x/week  PT DURATION: 4 weeks  PLANNED INTERVENTIONS: Therapeutic exercises, Therapeutic activity, Patient/Family education, Self Care, Joint mobilization, Orthotic/Fit training, Manual lymph drainage, Compression bandaging, scar mobilization, Taping, Vasopneumatic device, and Manual therapy  PLAN FOR NEXT SESSION: MLD and gentle manual to area of necrosis, how was foam?  [employer], PT 01/30/2023, 11:19 AM

## 2023-01-31 ENCOUNTER — Ambulatory Visit: Payer: Managed Care, Other (non HMO)

## 2023-01-31 DIAGNOSIS — C50411 Malignant neoplasm of upper-outer quadrant of right female breast: Secondary | ICD-10-CM | POA: Diagnosis not present

## 2023-01-31 DIAGNOSIS — Z171 Estrogen receptor negative status [ER-]: Secondary | ICD-10-CM

## 2023-01-31 DIAGNOSIS — M25611 Stiffness of right shoulder, not elsewhere classified: Secondary | ICD-10-CM

## 2023-01-31 DIAGNOSIS — L599 Disorder of the skin and subcutaneous tissue related to radiation, unspecified: Secondary | ICD-10-CM

## 2023-01-31 DIAGNOSIS — Z483 Aftercare following surgery for neoplasm: Secondary | ICD-10-CM

## 2023-01-31 NOTE — Therapy (Signed)
OUTPATIENT PHYSICAL THERAPY  UPPER EXTREMITY ONCOLOGY TREATMENT  Patient Name: Katrina Liu MRN: [id number] DOB:[birth year], 62 y.o., female Today's Date: 01/31/2023  END OF SESSION:  PT End of Session - 01/31/23 0808     Visit Number 2    Number of Visits 9    Date for PT Re-Evaluation 02/27/23    PT Start Time 0810   late   PT Stop Time 0856    PT Time Calculation (min) 46 min    Activity Tolerance Patient tolerated treatment well    Behavior During Therapy [facility] for tasks assessed/performed             Past Medical History:  Diagnosis Date   Anemia    Breast cancer (HCC) 10/02/2018   right breast lumpectomy   Family history of prostate cancer    GERD (gastroesophageal reflux disease)    h/o - not now   Headache(784.0)    Hypertension    Personal history of chemotherapy    Personal history of radiation therapy    S/P abdominal hysterectomy 04/17/2011   Stroke (HCC)    no residual   Past Surgical History:  Procedure Laterality Date   ABDOMINAL HYSTERECTOMY  04/16/2011   Procedure: HYSTERECTOMY ABDOMINAL;  Surgeon: Serita Kyle, MD;  Location: [facility];  Service: Gynecology;  Laterality: N/A;   BREAST BIOPSY Left 2013   BREAST BIOPSY Right 2020   BREAST LUMPECTOMY Right 10/02/2018   BREAST LUMPECTOMY WITH RADIOACTIVE SEED AND SENTINEL LYMPH NODE BIOPSY Right 10/02/2018   Procedure: RIGHT BREAST LUMPECTOMY WITH RADIOACTIVE SEED AND RIGHT SENTINEL LYMPH NODE MAPPING;  Surgeon: Harriette Bouillon, MD;  Location: [facility];  Service: General;  Laterality: Right;   KIDNEY STONE SURGERY     LAPAROTOMY     ectopic pregnancy   LOOP RECORDER IMPLANT N/A 12/24/2012   Procedure: LOOP RECORDER IMPLANT;  Surgeon: Duke Salvia, MD;  Location: [facility] CATH LAB;  Service: Cardiovascular;  Laterality: N/A;   LOOP RECORDER REMOVAL N/A 08/15/2016   Procedure: Loop Recorder Removal;  Surgeon: Duke Salvia, MD;  Location: [facility] INVASIVE CV LAB;  Service: Cardiovascular;   Laterality: N/A;   SHOULDER ARTHROSCOPY     TEE WITHOUT CARDIOVERSION N/A 12/24/2012   Procedure: TRANSESOPHAGEAL ECHOCARDIOGRAM (TEE);  Surgeon: Corky Crafts, MD;  Location: [facility] ENDOSCOPY;  Service: Cardiovascular;  Laterality: N/A;   Patient Active Problem List   Diagnosis Date Noted   Paroxysmal atrial fibrillation (HCC) 01/08/2022   Mixed hyperlipidemia 01/08/2022   Multinodular goiter 10/26/2020   Port-A-Cath in place 07/01/2018   Genetic testing 06/24/2018   Family history of prostate cancer    Malignant neoplasm of upper-outer quadrant of right breast in female, estrogen receptor negative (HCC) 06/02/2018   Recurrent oral ulcers 06/25/2017   Insomnia 06/03/2014   Migraine headache 12/25/2012   Cerebellar stroke syndrome 12/22/2012   Hypertension 12/22/2012   S/P abdominal hysterectomy 04/17/2011    PCP: Amada Kingfisher, FNP  REFERRING PROVIDER: Serena Croissant, MD  REFERRING DIAG: C50.411,Z17.1 (ICD-10-CM) - Malignant neoplasm of upper-outer quadrant of right breast in female, estrogen receptor negative (HCC)  THERAPY DIAG:  Disorder of the skin and subcutaneous tissue related to radiation, unspecified  Aftercare following surgery for neoplasm  Stiffness of right shoulder, not elsewhere classified  Malignant neoplasm of upper-outer quadrant of right breast in female, estrogen receptor negative [facility])  ONSET DATE: May 2023  Rationale for Evaluation and Treatment: Rehabilitation  SUBJECTIVE:  SUBJECTIVE STATEMENT: 01/31/2023 I tried the foam pad but I had to pull it out. It was very uncomfortable. Very tender and painful   EVAL  It started out as a small area of fat necrosis. It has progressed to an area of pain with shooting pain in to the axilla and breast. I could be just sitting  and a sharp pain can just shoot through. It is hard to sleep at night. Random shooting pains  PERTINENT HISTORY: 05/29/2018 R breast cancer grade 3 IDC triple negative with a Ki-67 of 20%, T1BN0 stage Ib clinical stage. Completed neoadjuvant chemo. 10/02/2018- Right Lumpectomy ([facility]): no residual carcinoma s/p neoadjuvant treatment, 0/3 lymph nodes negative for carcinoma. 11/06/2018 - 12/22/2018 completed radiation. Pt reports in early 2023 she felt a lump that turned out to be fat necrosis.   PAIN:  Are you having pain? Yes NPRS scale: 4/10 Pain location: R upper outer breast Pain orientation: Right  PAIN TYPE: aching and dull Pain description: constant  Aggravating factors: nothing Relieving factors: nothing  PRECAUTIONS: Other: R UE lymphedema risk  RED FLAGS: None   WEIGHT BEARING RESTRICTIONS: No  FALLS:  Has patient fallen in last 6 months? 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  COGNITION: Overall cognitive status: Within functional limits for tasks assessed   PALPATION: Area of fat necrosis palpable at R lateral outer breast, no cording palpable but could be deep  OBSERVATIONS / OTHER ASSESSMENTS: visible area of fat necrosis   POSTURE: forward head, rounded shoulders  UPPER EXTREMITY AROM/PROM:  A/PROM RIGHT   eval   Shoulder extension 70  Shoulder flexion 158  Shoulder abduction 159  Shoulder internal rotation 49  Shoulder external rotation 74    (Blank rows = not tested)  A/PROM LEFT    eval  Shoulder extension 70  Shoulder flexion 163  Shoulder abduction 165  Shoulder internal rotation 50  Shoulder external rotation 90    (Blank rows = not tested)  CERVICAL AROM: All within normal limits:       LYMPHEDEMA ASSESSMENTS:   LANDMARK RIGHT  eval  At axilla  28  15 cm proximal to olecranon process   10 cm proximal to olecranon process 24  Olecranon process 22.4  15 cm proximal to ulnar styloid process   10 cm proximal to ulnar styloid process 17  Just proximal to ulnar styloid process 14.5  Across hand at thumb web space 18.2  At base of 2nd digit 6  (Blank rows = not tested)  LANDMARK LEFT  eval  At axilla  30  15 cm proximal to olecranon process   10 cm proximal to olecranon process 24.9  Olecranon process 23.5  15 cm proximal to ulnar styloid process   10 cm proximal to ulnar styloid process 19.6  Just proximal to ulnar styloid process 14.7  Across hand at thumb web space 19.2  At base of 2nd digit 6.2  (Blank rows = not tested)    QUICK DASH SURVEY:       TODAY'S TREATMENT:  DATE:   01/31/2023 Soft tissue mobilization to right UT, Pectorals, lateral trunk with cocoa butter PROM Right shoulder flexion, abduction, D2 flexion, and ER Lower trunk rotation x 4 knees left  Wall slide abd x 4, Wall arc to left x 4 Pectoral corner stretch 3x 20 sec, single arm pec stretch at doorway Updated HEP    01/30/23- cut 1/2 grey foam and placed in thick stockinette to wear over area of fat necrosis to see if this improves comfort    PATIENT EDUCATION:  Access Code: [email or web address] URL: [email or web address] Date: 01/31/2023 Prepared by: Alvira Monday  Exercises Wall slide abd - Doorway Pec Stretch at 90 Degrees Abduction  - 1 x daily - 7 x weekly - 1 sets - 3 reps - Corner Pec Major Stretch  - 1 x  daily - 7 x weekly - 1 sets - 3 reps - Single Arm Doorway Pec Stretch at 90 Degrees Abduction  - 1 x daily - 7 x weekly - 1 sets - 3 reps - 20 hold Education details: cording, fat necrosis Person educated: Patient Education method: Explanation Education comprehension: verbalized understanding  HOME EXERCISE PROGRAM: Try wearing 1/2 grey foam over area of fat necrosis to see if this helps decrease pain  ASSESSMENT:  CLINICAL IMPRESSION:  Pt unable to tolerate foam pad in bra due to discomfort. Initiated soft tissue mobilization, PROM and gentle stretching and updated HEP. Pt did well with stretches and felt most in area of fat necrosis. Advised not to take to painful stretch.    OBJECTIVE IMPAIRMENTS: increased fascial restrictions, postural dysfunction, and pain.   ACTIVITY LIMITATIONS: carrying, lifting, and reach over head  PARTICIPATION LIMITATIONS: cleaning, community activity, and yard work  PERSONAL FACTORS: Time since onset of injury/illness/exacerbation are also affecting patient's functional outcome.   REHAB POTENTIAL: Good  CLINICAL DECISION MAKING: Stable/uncomplicated  EVALUATION COMPLEXITY: Low  GOALS: Goals reviewed with patient? Yes  SHORT TERM GOALS=LONG TERM GOALS Target date: 02/27/23  Pt will report a 50% decrease in pain in R breast to allow improved comfort.  Baseline: Goal status: INITIAL  2.  Pt will report no tightness at end range R shoulder ROM to decrease discomfort. Baseline:  Goal status: INITIAL  3.  Pt will be independent in a home exercise program for continued stretching and strengthening.  Baseline:  Goal status: INITIAL  PLAN:  PT FREQUENCY: 2x/week  PT DURATION: 4 weeks  PLANNED INTERVENTIONS: Therapeutic exercises, Therapeutic activity, Patient/Family education, Self Care, Joint mobilization, Orthotic/Fit training, Manual lymph drainage, Compression bandaging, scar mobilization, Taping, Vasopneumatic device, and Manual  therapy  PLAN FOR NEXT SESSION: MLD and gentle manual to area of necrosis, how was foam?, STM prn  Waynette Buttery, PT 01/31/2023, 8:58 AM

## 2023-02-04 ENCOUNTER — Ambulatory Visit: Payer: Managed Care, Other (non HMO)

## 2023-02-04 DIAGNOSIS — L599 Disorder of the skin and subcutaneous tissue related to radiation, unspecified: Secondary | ICD-10-CM

## 2023-02-04 DIAGNOSIS — Z171 Estrogen receptor negative status [ER-]: Secondary | ICD-10-CM

## 2023-02-04 DIAGNOSIS — Z483 Aftercare following surgery for neoplasm: Secondary | ICD-10-CM

## 2023-02-04 DIAGNOSIS — M25611 Stiffness of right shoulder, not elsewhere classified: Secondary | ICD-10-CM

## 2023-02-04 DIAGNOSIS — C50411 Malignant neoplasm of upper-outer quadrant of right female breast: Secondary | ICD-10-CM | POA: Diagnosis not present

## 2023-02-04 NOTE — Therapy (Signed)
OUTPATIENT PHYSICAL THERAPY  UPPER EXTREMITY ONCOLOGY TREATMENT  Patient Name: Katrina Liu MRN: [id number] DOB:10/22/1960, 62 y.o., female Today's Date: 02/04/2023  END OF SESSION:  PT End of Session - 02/04/23 1202     Visit Number 3    Number of Visits 9    Date for PT Re-Evaluation 02/27/23    PT Start Time 1203    PT Stop Time 1258    PT Time Calculation (min) 55 min    Activity Tolerance Patient tolerated treatment well    Behavior During Therapy [facility] for tasks assessed/performed             Past Medical History:  Diagnosis Date   Anemia    Breast cancer (HCC) 10/02/2018   right breast lumpectomy   Family history of prostate cancer    GERD (gastroesophageal reflux disease)    h/o - not now   Headache(784.0)    Hypertension    Personal history of chemotherapy    Personal history of radiation therapy    S/P abdominal hysterectomy 04/17/2011   Stroke (HCC)    no residual   Past Surgical History:  Procedure Laterality Date   ABDOMINAL HYSTERECTOMY  04/16/2011   Procedure: HYSTERECTOMY ABDOMINAL;  Surgeon: Serita Kyle, MD;  Location: [facility];  Service: Gynecology;  Laterality: N/A;   BREAST BIOPSY Left 2013   BREAST BIOPSY Right 2020   BREAST LUMPECTOMY Right 10/02/2018   BREAST LUMPECTOMY WITH RADIOACTIVE SEED AND SENTINEL LYMPH NODE BIOPSY Right 10/02/2018   Procedure: RIGHT BREAST LUMPECTOMY WITH RADIOACTIVE SEED AND RIGHT SENTINEL LYMPH NODE MAPPING;  Surgeon: Harriette Bouillon, MD;  Location: [facility];  Service: General;  Laterality: Right;   KIDNEY STONE SURGERY     LAPAROTOMY     ectopic pregnancy   LOOP RECORDER IMPLANT N/A 12/24/2012   Procedure: LOOP RECORDER IMPLANT;  Surgeon: Duke Salvia, MD;  Location: [facility] CATH LAB;  Service: Cardiovascular;  Laterality: N/A;   LOOP RECORDER REMOVAL N/A 08/15/2016   Procedure: Loop Recorder Removal;  Surgeon: Duke Salvia, MD;  Location: [facility] INVASIVE CV LAB;  Service: Cardiovascular;   Laterality: N/A;   SHOULDER ARTHROSCOPY     TEE WITHOUT CARDIOVERSION N/A 12/24/2012   Procedure: TRANSESOPHAGEAL ECHOCARDIOGRAM (TEE);  Surgeon: Corky Crafts, MD;  Location: [facility] ENDOSCOPY;  Service: Cardiovascular;  Laterality: N/A;   Patient Active Problem List   Diagnosis Date Noted   Paroxysmal atrial fibrillation (HCC) 01/08/2022   Mixed hyperlipidemia 01/08/2022   Multinodular goiter 10/26/2020   Port-A-Cath in place 07/01/2018   Genetic testing 06/24/2018   Family history of prostate cancer    Malignant neoplasm of upper-outer quadrant of right breast in female, estrogen receptor negative (HCC) 06/02/2018   Recurrent oral ulcers 06/25/2017   Insomnia 06/03/2014   Migraine headache 12/25/2012   Cerebellar stroke syndrome 12/22/2012   Hypertension 12/22/2012   S/P abdominal hysterectomy 04/17/2011    PCP: Amada Kingfisher, FNP  REFERRING PROVIDER: Serena Croissant, MD  REFERRING DIAG: C50.411,Z17.1 (ICD-10-CM) - Malignant neoplasm of upper-outer quadrant of right breast in female, estrogen receptor negative (HCC)  THERAPY DIAG:  Disorder of the skin and subcutaneous tissue related to radiation, unspecified  Aftercare following surgery for neoplasm  Stiffness of right shoulder, not elsewhere classified  Malignant neoplasm of upper-outer quadrant of right breast in female, estrogen receptor negative [facility])  ONSET DATE: May 2023  Rationale for Evaluation and Treatment: Rehabilitation  SUBJECTIVE:  SUBJECTIVE STATEMENT: 02/04/2023 Only 1 shooting pain yesterday in church. I did ok the day I was here. After I stretch the throbbing eases off. Fri and Saturday I did OK. I slept on an airmattress with a foam pad and I slept better.  I was pretty good this am and then as soon as I got dressed my  breast started throbbing and some sharp pains. I can feel the cords still, but it doesn't seem to bother me much.    EVAL  It started out as a small area of fat necrosis. It has progressed to an area of pain with shooting pain in to the axilla and breast. I could be just sitting and a sharp pain can just shoot through. It is hard to sleep at night. Random shooting pains  PERTINENT HISTORY: 05/29/2018 R breast cancer grade 3 IDC triple negative with a Ki-67 of 20%, T1BN0 stage Ib clinical stage. Completed neoadjuvant chemo. 10/02/2018- Right Lumpectomy ([facility]): no residual carcinoma s/p neoadjuvant treatment, 0/3 lymph nodes negative for carcinoma. 11/06/2018 - 12/22/2018 completed radiation. Pt reports in early 2023 she felt a lump that turned out to be fat necrosis.   PAIN:  Are you having pain? Yes NPRS scale: 5/10 with movement Pain location: R upper outer breast Pain orientation: Right  PAIN TYPE: aching and dull Pain description: constant  Aggravating factors: nothing Relieving factors: nothing  PRECAUTIONS: Other: R UE lymphedema risk  RED FLAGS: None   WEIGHT BEARING RESTRICTIONS: No  FALLS:  Has patient fallen in last 6 months? 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  COGNITION: Overall cognitive status: Within functional limits for tasks assessed   PALPATION: Area of fat necrosis palpable at R lateral outer breast, no cording palpable but could be deep  OBSERVATIONS / OTHER ASSESSMENTS: visible area  of fat necrosis   POSTURE: forward head, rounded shoulders  UPPER EXTREMITY AROM/PROM:  A/PROM RIGHT   eval   Shoulder extension 70  Shoulder flexion 158  Shoulder abduction 159  Shoulder internal rotation 49  Shoulder external rotation 74    (Blank rows = not tested)  A/PROM LEFT   eval  Shoulder extension 70  Shoulder flexion 163  Shoulder abduction 165  Shoulder internal rotation 50  Shoulder external rotation 90    (Blank rows = not tested)  CERVICAL AROM: All within normal limits:       LYMPHEDEMA ASSESSMENTS:   LANDMARK RIGHT  eval  At axilla  28  15 cm proximal to olecranon process   10 cm proximal to olecranon process 24  Olecranon process 22.4  15 cm proximal to ulnar styloid process   10 cm proximal to ulnar styloid process 17  Just proximal to ulnar styloid process 14.5  Across hand at thumb web space 18.2  At base of 2nd digit 6  (Blank rows = not tested)  LANDMARK LEFT  eval  At axilla  30  15 cm proximal to olecranon process   10 cm proximal to olecranon process 24.9  Olecranon process 23.5  15 cm proximal  to ulnar styloid process   10 cm proximal to ulnar styloid process 19.6  Just proximal to ulnar styloid process 14.7  Across hand at thumb web space 19.2  At base of 2nd digit 6.2  (Blank rows = not tested)    QUICK DASH SURVEY:       TODAY'S TREATMENT:                                                                                                                                          DATE:  02/04/2023  Wall arc to left x 3 Lat stretch at laundry cart x 3 MFR to axilla and upper arm cording present but not creating stretch PROM right shoulder flexion, scaption, abduction , ER, Discussed desensitization techniques using polar fleece, sherpa, flannel etc to try and desensitize area of fat necrosis Educated pt in MLD and gentleness of stretch using diagrams while explaining. Used gentle massage prior to MLD at area of fat  necrosis In supine: Short neck, Right axillary LN's, 5 diaphragmatic breaths,  R inguinal nodes and establishment of  right axilloi-nguinal pathway, then R axillo-inguinal pathway repeated multiple times in supine and in SL and ending with inguinal LN's. Pt practiced all parts in supine and in SL and used very good pressure and stretch   01/31/2023 Soft tissue mobilization to right UT, Pectorals, lateral trunk with cocoa butter PROM Right shoulder flexion, abduction, D2 flexion, and ER Lower trunk rotation x 4 knees left  Wall slide abd x 4, Wall arc to left x 4 Pectoral corner stretch 3x 20 sec, single arm pec stretch at doorway Updated HEP    01/30/23- cut 1/2 grey foam and placed in thick stockinette to wear over area of fat necrosis to see if this improves comfort    PATIENT EDUCATION:  Access Code: [email or web address] URL: [email or web address] Date: 01/31/2023 Prepared by: Alvira Monday  Exercises Wall slide abd - Doorway Pec Stretch at 90 Degrees Abduction  - 1 x daily - 7 x weekly - 1 sets - 3 reps - Corner Pec Major Stretch  - 1 x daily - 7 x weekly - 1 sets - 3 reps - Single Arm Doorway Pec Stretch at 90 Degrees Abduction  - 1 x daily - 7 x weekly - 1 sets - 3 reps - 20 hold Education details: cording, fat necrosis Person educated: Patient Education method: Explanation Education comprehension: verbalized understanding  HOME EXERCISE PROGRAM: Try wearing 1/2 grey foam over area of fat necrosis to see if this helps decrease pain  ASSESSMENT:  CLINICAL IMPRESSION:  Cording is still present in axilla but was not limiting pt today. Educated pt in desensitization techniques to area of fat necrosis and MLD to the lateral breast. She will continue to stretch at home, but will also try MLD. She felt  good at completion of therapy and used good technique with MLD  OBJECTIVE IMPAIRMENTS: increased fascial restrictions, postural dysfunction, and pain.   ACTIVITY  LIMITATIONS: carrying, lifting, and reach over head  PARTICIPATION LIMITATIONS: cleaning, community activity, and yard work  PERSONAL FACTORS: Time since onset of injury/illness/exacerbation are also affecting patient's functional outcome.   REHAB POTENTIAL: Good  CLINICAL DECISION MAKING: Stable/uncomplicated  EVALUATION COMPLEXITY: Low  GOALS: Goals reviewed with patient? Yes  SHORT TERM GOALS=LONG TERM GOALS Target date: 02/27/23  Pt will report a 50% decrease in pain in R breast to allow improved comfort.  Baseline: Goal status: INITIAL  2.  Pt will report no tightness at end range R shoulder ROM to decrease discomfort. Baseline:  Goal status: INITIAL  3.  Pt will be independent in a home exercise program for continued stretching and strengthening.  Baseline:  Goal status: INITIAL  PLAN:  PT FREQUENCY: 2x/week  PT DURATION: 4 weeks  PLANNED INTERVENTIONS: Therapeutic exercises, Therapeutic activity, Patient/Family education, Self Care, Joint mobilization, Orthotic/Fit training, Manual lymph drainage, Compression bandaging, scar mobilization, Taping, Vasopneumatic device, and Manual therapy  PLAN FOR NEXT SESSION: MLD and gentle manual to area of necrosis, how did desensitization do?, STM prn  Waynette Buttery, PT 02/04/2023, 1:02 PM

## 2023-02-12 ENCOUNTER — Ambulatory Visit: Payer: Managed Care, Other (non HMO) | Admitting: Physical Therapy

## 2023-02-13 ENCOUNTER — Other Ambulatory Visit: Payer: Managed Care, Other (non HMO)

## 2023-02-13 ENCOUNTER — Inpatient Hospital Stay: Admission: RE | Admit: 2023-02-13 | Payer: Managed Care, Other (non HMO) | Source: Ambulatory Visit

## 2023-02-13 ENCOUNTER — Other Ambulatory Visit: Payer: Self-pay | Admitting: Nurse Practitioner

## 2023-02-13 DIAGNOSIS — E2839 Other primary ovarian failure: Secondary | ICD-10-CM

## 2023-02-14 ENCOUNTER — Ambulatory Visit: Payer: Managed Care, Other (non HMO)

## 2023-02-18 ENCOUNTER — Encounter: Payer: Self-pay | Admitting: Nurse Practitioner

## 2023-02-18 ENCOUNTER — Encounter: Payer: Self-pay | Admitting: Physical Therapy

## 2023-02-18 ENCOUNTER — Ambulatory Visit: Payer: Managed Care, Other (non HMO) | Admitting: Physical Therapy

## 2023-02-18 DIAGNOSIS — C50411 Malignant neoplasm of upper-outer quadrant of right female breast: Secondary | ICD-10-CM

## 2023-02-18 DIAGNOSIS — L599 Disorder of the skin and subcutaneous tissue related to radiation, unspecified: Secondary | ICD-10-CM

## 2023-02-18 DIAGNOSIS — M25611 Stiffness of right shoulder, not elsewhere classified: Secondary | ICD-10-CM

## 2023-02-18 DIAGNOSIS — Z483 Aftercare following surgery for neoplasm: Secondary | ICD-10-CM

## 2023-02-18 NOTE — Therapy (Signed)
OUTPATIENT PHYSICAL THERAPY  UPPER EXTREMITY ONCOLOGY TREATMENT  Patient Name: Katrina Liu MRN: [id number] DOB:1960-09-22, 62 y.o., female Today's Date: 02/18/2023  END OF SESSION:  PT End of Session - 02/18/23 1556     Visit Number 4    Number of Visits 9    Date for PT Re-Evaluation 02/27/23    PT Start Time 1505    PT Stop Time 1555    PT Time Calculation (min) 50 min    Activity Tolerance Patient tolerated treatment well    Behavior During Therapy [facility] for tasks assessed/performed              Past Medical History:  Diagnosis Date   Anemia    Breast cancer (HCC) 10/02/2018   right breast lumpectomy   Family history of prostate cancer    GERD (gastroesophageal reflux disease)    h/o - not now   Headache(784.0)    Hypertension    Personal history of chemotherapy    Personal history of radiation therapy    S/P abdominal hysterectomy 04/17/2011   Stroke (HCC)    no residual   Past Surgical History:  Procedure Laterality Date   ABDOMINAL HYSTERECTOMY  04/16/2011   Procedure: HYSTERECTOMY ABDOMINAL;  Surgeon: Serita Kyle, MD;  Location: [facility];  Service: Gynecology;  Laterality: N/A;   BREAST BIOPSY Left 2013   BREAST BIOPSY Right 2020   BREAST LUMPECTOMY Right 10/02/2018   BREAST LUMPECTOMY WITH RADIOACTIVE SEED AND SENTINEL LYMPH NODE BIOPSY Right 10/02/2018   Procedure: RIGHT BREAST LUMPECTOMY WITH RADIOACTIVE SEED AND RIGHT SENTINEL LYMPH NODE MAPPING;  Surgeon: Harriette Bouillon, MD;  Location: [facility];  Service: General;  Laterality: Right;   KIDNEY STONE SURGERY     LAPAROTOMY     ectopic pregnancy   LOOP RECORDER IMPLANT N/A 12/24/2012   Procedure: LOOP RECORDER IMPLANT;  Surgeon: Duke Salvia, MD;  Location: [facility] CATH LAB;  Service: Cardiovascular;  Laterality: N/A;   LOOP RECORDER REMOVAL N/A 08/15/2016   Procedure: Loop Recorder Removal;  Surgeon: Duke Salvia, MD;  Location: [facility] INVASIVE CV LAB;  Service: Cardiovascular;   Laterality: N/A;   SHOULDER ARTHROSCOPY     TEE WITHOUT CARDIOVERSION N/A 12/24/2012   Procedure: TRANSESOPHAGEAL ECHOCARDIOGRAM (TEE);  Surgeon: Corky Crafts, MD;  Location: [facility] ENDOSCOPY;  Service: Cardiovascular;  Laterality: N/A;   Patient Active Problem List   Diagnosis Date Noted   Paroxysmal atrial fibrillation (HCC) 01/08/2022   Mixed hyperlipidemia 01/08/2022   Multinodular goiter 10/26/2020   Port-A-Cath in place 07/01/2018   Genetic testing 06/24/2018   Family history of prostate cancer    Malignant neoplasm of upper-outer quadrant of right breast in female, estrogen receptor negative (HCC) 06/02/2018   Recurrent oral ulcers 06/25/2017   Insomnia 06/03/2014   Migraine headache 12/25/2012   Cerebellar stroke syndrome 12/22/2012   Hypertension 12/22/2012   S/P abdominal hysterectomy 04/17/2011    PCP: Amada Kingfisher, FNP  REFERRING PROVIDER: Serena Croissant, MD  REFERRING DIAG: C50.411,Z17.1 (ICD-10-CM) - Malignant neoplasm of upper-outer quadrant of right breast in female, estrogen receptor negative (HCC)  THERAPY DIAG:  Disorder of the skin and subcutaneous tissue related to radiation, unspecified  Aftercare following surgery for neoplasm  Stiffness of right shoulder, not elsewhere classified  Malignant neoplasm of upper-outer quadrant of right breast in female, estrogen receptor negative [facility])  ONSET DATE: May 2023  Rationale for Evaluation and Treatment: Rehabilitation  SUBJECTIVE:  SUBJECTIVE STATEMENT: I noticed some deep dimpling near the scar. I am not sure if I just had not noticed it. I have not had the shooting pains from my arm in to my breast. There is pain when I touch that area. I tried all of the nerve desensitization but it all hurts.    EVAL  It started out as  a small area of fat necrosis. It has progressed to an area of pain with shooting pain in to the axilla and breast. I could be just sitting and a sharp pain can just shoot through. It is hard to sleep at night. Random shooting pains  PERTINENT HISTORY: 05/29/2018 R breast cancer grade 3 IDC triple negative with a Ki-67 of 20%, T1BN0 stage Ib clinical stage. Completed neoadjuvant chemo. 10/02/2018- Right Lumpectomy ([facility]): no residual carcinoma s/p neoadjuvant treatment, 0/3 lymph nodes negative for carcinoma. 11/06/2018 - 12/22/2018 completed radiation. Pt reports in early 2023 she felt a lump that turned out to be fat necrosis.   PAIN:  Are you having pain? Yes NPRS scale: 6/10 with movement Pain location: R upper outer breast Pain orientation: Right  PAIN TYPE: aching and dull Pain description: constant  Aggravating factors: nothing Relieving factors: nothing  PRECAUTIONS: Other: R UE lymphedema risk  RED FLAGS: None   WEIGHT BEARING RESTRICTIONS: No  FALLS:  Has patient fallen in last 6 months? 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  COGNITION: Overall cognitive status: Within functional limits for tasks assessed   PALPATION: Area of fat necrosis palpable at R lateral outer breast, no cording palpable but could be deep  OBSERVATIONS / OTHER ASSESSMENTS: visible area of fat necrosis   POSTURE: forward head, rounded shoulders  UPPER EXTREMITY AROM/PROM:  A/PROM RIGHT   eval   Shoulder extension 70   Shoulder flexion 158  Shoulder abduction 159  Shoulder internal rotation 49  Shoulder external rotation 74    (Blank rows = not tested)  A/PROM LEFT   eval  Shoulder extension 70  Shoulder flexion 163  Shoulder abduction 165  Shoulder internal rotation 50  Shoulder external rotation 90    (Blank rows = not tested)  CERVICAL AROM: All within normal limits:       LYMPHEDEMA ASSESSMENTS:   LANDMARK RIGHT  eval  At axilla  28  15 cm proximal to olecranon process   10 cm proximal to olecranon process 24  Olecranon process 22.4  15 cm proximal to ulnar styloid process   10 cm proximal to ulnar styloid process 17  Just proximal to ulnar styloid process 14.5  Across hand at thumb web space 18.2  At base of 2nd digit 6  (Blank rows = not tested)  LANDMARK LEFT  eval  At axilla  30  15 cm proximal to olecranon process   10 cm proximal to olecranon process 24.9  Olecranon process 23.5  15 cm proximal to ulnar styloid process   10 cm proximal to ulnar styloid process 19.6  Just proximal to ulnar styloid process 14.7  Across hand at thumb  web space 19.2  At base of 2nd digit 6.2  (Blank rows = not tested)    QUICK DASH SURVEY:       TODAY'S TREATMENT:                                                                                                                                          DATE:  02/18/23: Issued handout and instructed pt in nerve desensitization technique  Instructed pt in childs pose walking fingers to L side to stretch R trunk with pt feeling good stretch at area of fat necrosis and down side Manual therapy in supine: STM initially with extremely light touch using 1 finger to area of fat necrosis then as it became more desensitized began to use more pressure and two fingers - doing circles then lightly pinning skin and stretching in all directions across area of necrosis - eventually was able to use more pressure Suction cupping with cocoa butter  to entire area - light pulling upwards and moving in all directions - significant improvement noted in texture and sensitivity by end of session  02/04/2023  Wall arc to left x 3 Lat stretch at laundry cart x 3 MFR to axilla and upper arm cording present but not creating stretch PROM right shoulder flexion, scaption, abduction , ER, Discussed desensitization techniques using polar fleece, sherpa, flannel etc to try and desensitize area of fat necrosis Educated pt in MLD and gentleness of stretch using diagrams while explaining. Used gentle massage prior to MLD at area of fat necrosis In supine: Short neck, Right axillary LN's, 5 diaphragmatic breaths,  R inguinal nodes and establishment of  right axilloi-nguinal pathway, then R axillo-inguinal pathway repeated multiple times in supine and in SL and ending with inguinal LN's. Pt practiced all parts in supine and in SL and used very good pressure and stretch   01/31/2023 Soft tissue mobilization to right UT, Pectorals, lateral trunk with cocoa butter PROM Right shoulder flexion, abduction, D2 flexion, and ER Lower trunk rotation x 4 knees left  Wall slide abd x 4, Wall arc to left x 4 Pectoral corner stretch 3x 20 sec, single arm pec stretch at doorway Updated HEP    01/30/23- cut 1/2 grey foam and placed in thick stockinette to wear over area of fat necrosis to see if this improves comfort    PATIENT EDUCATION:  Access Code: [email or web address] URL: [email or web address] Date: 01/31/2023 Prepared by: Alvira Monday  Exercises Wall slide abd - Doorway Pec Stretch at 90 Degrees Abduction  - 1 x daily - 7 x weekly - 1 sets - 3 reps - Corner Pec Major Stretch  - 1 x daily - 7 x weekly - 1 sets - 3 reps - Single Arm Doorway Pec Stretch at 90 Degrees Abduction  - 1 x daily - 7 x weekly - 1 sets - 3 reps - 20 hold Education  details: cording, fat necrosis Person educated: Patient Education method: Explanation Education comprehension:  verbalized understanding  HOME EXERCISE PROGRAM: Try wearing 1/2 grey foam over area of fat necrosis to see if this helps decrease pain Self massage Desensitization  ASSESSMENT:  CLINICAL IMPRESSION:  Began manual therapy today to area of fat necrosis doing gentle STM and then stretching skin across area in all directions. Pt became desensitized and was able to tolerate more pressure. Began using suction cups to help mobilize skin and decrease adhesions. Pt demonstrates significant decreases in pain and improved texture by end of session.   OBJECTIVE IMPAIRMENTS: increased fascial restrictions, postural dysfunction, and pain.   ACTIVITY LIMITATIONS: carrying, lifting, and reach over head  PARTICIPATION LIMITATIONS: cleaning, community activity, and yard work  PERSONAL FACTORS: Time since onset of injury/illness/exacerbation are also affecting patient's functional outcome.   REHAB POTENTIAL: Good  CLINICAL DECISION MAKING: Stable/uncomplicated  EVALUATION COMPLEXITY: Low  GOALS: Goals reviewed with patient? Yes  SHORT TERM GOALS=LONG TERM GOALS Target date: 02/27/23  Pt will report a 50% decrease in pain in R breast to allow improved comfort.  Baseline: Goal status: INITIAL  2.  Pt will report no tightness at end range R shoulder ROM to decrease discomfort. Baseline:  Goal status: INITIAL  3.  Pt will be independent in a home exercise program for continued stretching and strengthening.  Baseline:  Goal status: INITIAL  PLAN:  PT FREQUENCY: 2x/week  PT DURATION: 4 weeks  PLANNED INTERVENTIONS: Therapeutic exercises, Therapeutic activity, Patient/Family education, Self Care, Joint mobilization, Orthotic/Fit training, Manual lymph drainage, Compression bandaging, scar mobilization, Taping, Vasopneumatic device, and Manual therapy  PLAN FOR NEXT SESSION: MLD and gentle manual to area of necrosis, how did desensitization do?, STM prn  [employer],  PT 02/18/2023, 4:02 PM

## 2023-02-19 ENCOUNTER — Other Ambulatory Visit: Payer: Self-pay | Admitting: Nurse Practitioner

## 2023-02-19 DIAGNOSIS — E2839 Other primary ovarian failure: Secondary | ICD-10-CM

## 2023-02-21 ENCOUNTER — Ambulatory Visit: Payer: Managed Care, Other (non HMO)

## 2023-02-21 DIAGNOSIS — M25611 Stiffness of right shoulder, not elsewhere classified: Secondary | ICD-10-CM

## 2023-02-21 DIAGNOSIS — L599 Disorder of the skin and subcutaneous tissue related to radiation, unspecified: Secondary | ICD-10-CM

## 2023-02-21 DIAGNOSIS — Z483 Aftercare following surgery for neoplasm: Secondary | ICD-10-CM

## 2023-02-21 DIAGNOSIS — C50411 Malignant neoplasm of upper-outer quadrant of right female breast: Secondary | ICD-10-CM | POA: Diagnosis not present

## 2023-02-21 DIAGNOSIS — Z171 Estrogen receptor negative status [ER-]: Secondary | ICD-10-CM

## 2023-02-21 NOTE — Therapy (Signed)
OUTPATIENT PHYSICAL THERAPY  UPPER EXTREMITY ONCOLOGY TREATMENT  Patient Name: Katrina Liu MRN: [id number] DOB:April 19, 1961, 62 y.o., female Today's Date: 02/21/2023  END OF SESSION:  PT End of Session - 02/21/23 0757     Visit Number 5    Number of Visits 9    Date for PT Re-Evaluation 02/27/23    PT Start Time 0800    PT Stop Time 0850    PT Time Calculation (min) 50 min    Activity Tolerance Patient tolerated treatment well    Behavior During Therapy [facility] for tasks assessed/performed              Past Medical History:  Diagnosis Date   Anemia    Breast cancer (HCC) 10/02/2018   right breast lumpectomy   Family history of prostate cancer    GERD (gastroesophageal reflux disease)    h/o - not now   Headache(784.0)    Hypertension    Personal history of chemotherapy    Personal history of radiation therapy    S/P abdominal hysterectomy 04/17/2011   Stroke (HCC)    no residual   Past Surgical History:  Procedure Laterality Date   ABDOMINAL HYSTERECTOMY  04/16/2011   Procedure: HYSTERECTOMY ABDOMINAL;  Surgeon: Serita Kyle, MD;  Location: [facility];  Service: Gynecology;  Laterality: N/A;   BREAST BIOPSY Left 2013   BREAST BIOPSY Right 2020   BREAST LUMPECTOMY Right 10/02/2018   BREAST LUMPECTOMY WITH RADIOACTIVE SEED AND SENTINEL LYMPH NODE BIOPSY Right 10/02/2018   Procedure: RIGHT BREAST LUMPECTOMY WITH RADIOACTIVE SEED AND RIGHT SENTINEL LYMPH NODE MAPPING;  Surgeon: Harriette Bouillon, MD;  Location: [facility];  Service: General;  Laterality: Right;   KIDNEY STONE SURGERY     LAPAROTOMY     ectopic pregnancy   LOOP RECORDER IMPLANT N/A 12/24/2012   Procedure: LOOP RECORDER IMPLANT;  Surgeon: Duke Salvia, MD;  Location: [facility] CATH LAB;  Service: Cardiovascular;  Laterality: N/A;   LOOP RECORDER REMOVAL N/A 08/15/2016   Procedure: Loop Recorder Removal;  Surgeon: Duke Salvia, MD;  Location: [facility] INVASIVE CV LAB;  Service: Cardiovascular;   Laterality: N/A;   SHOULDER ARTHROSCOPY     TEE WITHOUT CARDIOVERSION N/A 12/24/2012   Procedure: TRANSESOPHAGEAL ECHOCARDIOGRAM (TEE);  Surgeon: Corky Crafts, MD;  Location: [facility] ENDOSCOPY;  Service: Cardiovascular;  Laterality: N/A;   Patient Active Problem List   Diagnosis Date Noted   Paroxysmal atrial fibrillation (HCC) 01/08/2022   Mixed hyperlipidemia 01/08/2022   Multinodular goiter 10/26/2020   Port-A-Cath in place 07/01/2018   Genetic testing 06/24/2018   Family history of prostate cancer    Malignant neoplasm of upper-outer quadrant of right breast in female, estrogen receptor negative (HCC) 06/02/2018   Recurrent oral ulcers 06/25/2017   Insomnia 06/03/2014   Migraine headache 12/25/2012   Cerebellar stroke syndrome 12/22/2012   Hypertension 12/22/2012   S/P abdominal hysterectomy 04/17/2011    PCP: Amada Kingfisher, FNP  REFERRING PROVIDER: Serena Croissant, MD  REFERRING DIAG: C50.411,Z17.1 (ICD-10-CM) - Malignant neoplasm of upper-outer quadrant of right breast in female, estrogen receptor negative (HCC)  THERAPY DIAG:  Disorder of the skin and subcutaneous tissue related to radiation, unspecified  Aftercare following surgery for neoplasm  Stiffness of right shoulder, not elsewhere classified  Malignant neoplasm of upper-outer quadrant of right breast in female, estrogen receptor negative [facility])  ONSET DATE: May 2023  Rationale for Evaluation and Treatment: Rehabilitation  SUBJECTIVE:  SUBJECTIVE STATEMENT: I did well while I was here. I ordered some cups in the mail and it came yesterday. I have been massaging myself and it does feel smaller. I rarely get the shooting pains from my arm to the breast.  Every now and then I get a shooting pain but not often. I tried the  desensitization with silk and cotton balls and that was fine. Shoulder ROM is fine. I haven't been doing the MLD;I was really sore after you showed me last time.   EVAL  It started out as a small area of fat necrosis. It has progressed to an area of pain with shooting pain in to the axilla and breast. I could be just sitting and a sharp pain can just shoot through. It is hard to sleep at night. Random shooting pains  PERTINENT HISTORY: 05/29/2018 R breast cancer grade 3 IDC triple negative with a Ki-67 of 20%, T1BN0 stage Ib clinical stage. Completed neoadjuvant chemo. 10/02/2018- Right Lumpectomy ([facility]): no residual carcinoma s/p neoadjuvant treatment, 0/3 lymph nodes negative for carcinoma. 11/06/2018 - 12/22/2018 completed radiation. Pt reports in early 2023 she felt a lump that turned out to be fat necrosis.   PAIN:  Are you having pain? Yes NPRS scale: 3/10 with touch, and movement Pain location: R upper outer breast Pain orientation: Right  PAIN TYPE: aching and dull Pain description: constant  Aggravating factors: nothing Relieving factors: nothing  PRECAUTIONS: Other: R UE lymphedema risk  RED FLAGS: None   WEIGHT BEARING RESTRICTIONS: No  FALLS:  Has patient fallen in last 6 months? 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  COGNITION: Overall cognitive status: Within functional limits for tasks assessed   PALPATION: Area of fat necrosis palpable at R lateral outer breast, no cording palpable but could  be deep  OBSERVATIONS / OTHER ASSESSMENTS: visible area of fat necrosis   POSTURE: forward head, rounded shoulders  UPPER EXTREMITY AROM/PROM:  A/PROM RIGHT   eval   Shoulder extension 70  Shoulder flexion 158  Shoulder abduction 159  Shoulder internal rotation 49  Shoulder external rotation 74    (Blank rows = not tested)  A/PROM LEFT   eval  Shoulder extension 70  Shoulder flexion 163  Shoulder abduction 165  Shoulder internal rotation 50  Shoulder external rotation 90    (Blank rows = not tested)  CERVICAL AROM: All within normal limits:       LYMPHEDEMA ASSESSMENTS:   LANDMARK RIGHT  eval  At axilla  28  15 cm proximal to olecranon process   10 cm proximal to olecranon process 24  Olecranon process 22.4  15 cm proximal to ulnar styloid process   10 cm proximal to ulnar styloid process 17  Just proximal to ulnar styloid process 14.5  Across hand at thumb web space 18.2  At base of 2nd digit 6  (Blank rows = not tested)  LANDMARK LEFT  eval  At axilla  30  15 cm proximal to olecranon process   10 cm proximal to olecranon process 24.9  Olecranon process 23.5  15 cm proximal to ulnar styloid process   10 cm proximal to ulnar styloid process 19.6  Just proximal to ulnar styloid process 14.7  Across hand at thumb web space 19.2  At base of 2nd digit 6.2  (Blank rows = not tested)    QUICK DASH SURVEY:       TODAY'S TREATMENT:                                                                                                                                          DATE:  02/21/2023 Wall arc to the left x 3 prayer stretch x 4 on table Manual therapy in supine: STM initially with extremely light touch using 1 finger to area of fat necrosis then as it became more desensitized began to use more pressure and two fingers - doing circles then lightly pinning skin and stretching in all directions across area of necrosis - eventually was able to use more  pressure Suction cupping with cocoa butter to entire area - light traction, tilt, and moving in all directions  02/18/23: Issued handout and instructed pt in nerve desensitization technique  Instructed pt in childs pose walking fingers to L side to stretch R trunk with pt feeling good stretch at area of fat necrosis and down side Manual therapy in supine: STM initially with extremely light touch using 1 finger to area of fat necrosis then as it became more desensitized began to use more pressure and two fingers - doing circles then lightly pinning skin and stretching in all directions across area of necrosis - eventually was able to use more pressure Suction cupping with cocoa butter to entire area - light pulling upwards and moving in all directions - significant improvement noted in texture and sensitivity by end of session  02/04/2023  Wall arc to left x 3 Lat stretch at laundry cart x 3 MFR to axilla and upper arm cording present but not creating stretch PROM right shoulder flexion, scaption, abduction , ER, Discussed desensitization techniques using polar fleece, sherpa, flannel etc to try and desensitize area of fat necrosis Educated pt in MLD and gentleness of stretch using diagrams while explaining. Used gentle massage prior to MLD at area of fat necrosis In supine: Short neck, Right axillary LN's, 5 diaphragmatic breaths,  R inguinal nodes and establishment of  right axilloi-nguinal pathway, then R axillo-inguinal pathway repeated multiple times in supine and in SL and ending with inguinal LN's. Pt practiced all parts in supine and in SL and used very good pressure and stretch   01/31/2023 Soft tissue mobilization to right UT, Pectorals, lateral trunk with cocoa butter PROM Right shoulder flexion, abduction, D2 flexion, and ER Lower trunk rotation x 4 knees left  Wall slide abd x 4, Wall arc to left x 4 Pectoral corner stretch 3x 20 sec, single arm pec stretch at doorway  Updated  HEP    01/30/23- cut 1/2 grey foam and placed in thick stockinette to wear over area of fat necrosis to see if this improves comfort    PATIENT EDUCATION:  Access Code: [email or web address] URL: [email or web address] Date: 01/31/2023 Prepared by: Alvira Monday  Exercises Wall slide abd - Doorway Pec Stretch at 90 Degrees Abduction  - 1 x daily - 7 x weekly - 1 sets - 3 reps - Corner Pec Major Stretch  - 1 x daily - 7 x weekly - 1 sets - 3 reps - Single Arm Doorway Pec Stretch at 90 Degrees Abduction  - 1 x daily - 7 x weekly - 1 sets - 3 reps - 20 hold Education details: cording, fat necrosis Person educated: Patient Education method: Explanation Education comprehension: verbalized understanding  HOME EXERCISE PROGRAM: Try wearing 1/2 grey foam over area of fat necrosis to see if this helps decrease pain Self massage Desensitization  ASSESSMENT:  CLINICAL IMPRESSION:  Continued manual work with pt to desensitize area of fat necrosis and continued cupping with traction, tilt, movement. Pt felt good afterwards and area of necrosis was again reduced.  OBJECTIVE IMPAIRMENTS: increased fascial restrictions, postural dysfunction, and pain.   ACTIVITY LIMITATIONS: carrying, lifting, and reach over head  PARTICIPATION LIMITATIONS: cleaning, community activity, and yard work  PERSONAL FACTORS: Time since onset of injury/illness/exacerbation are also affecting patient's functional outcome.   REHAB POTENTIAL: Good  CLINICAL DECISION MAKING: Stable/uncomplicated  EVALUATION COMPLEXITY: Low  GOALS: Goals reviewed with patient? Yes  SHORT TERM GOALS=LONG TERM GOALS Target date: 02/27/23  Pt will report a 50% decrease in pain in R breast to allow improved comfort.  Baseline: Goal status: INITIAL  2.  Pt will report no tightness at end range R shoulder ROM to decrease discomfort. Baseline:  Goal status: INITIAL  3.  Pt will be independent in a home exercise program for  continued stretching and strengthening.  Baseline:  Goal status: INITIAL  PLAN:  PT FREQUENCY: 2x/week  PT DURATION: 4 weeks  PLANNED INTERVENTIONS: Therapeutic exercises, Therapeutic activity, Patient/Family education, Self Care, Joint mobilization, Orthotic/Fit training, Manual lymph drainage, Compression bandaging, scar mobilization, Taping, Vasopneumatic device, and Manual therapy  PLAN FOR NEXT SESSION: MLD and gentle manual to area of necrosis, how did desensitization do?, STM prn  Waynette Buttery, PT 02/21/2023, 8:53 AM

## 2023-02-26 ENCOUNTER — Ambulatory Visit: Payer: Managed Care, Other (non HMO)

## 2023-02-26 DIAGNOSIS — Z483 Aftercare following surgery for neoplasm: Secondary | ICD-10-CM

## 2023-02-26 DIAGNOSIS — L599 Disorder of the skin and subcutaneous tissue related to radiation, unspecified: Secondary | ICD-10-CM

## 2023-02-26 DIAGNOSIS — M25611 Stiffness of right shoulder, not elsewhere classified: Secondary | ICD-10-CM

## 2023-02-26 DIAGNOSIS — C50411 Malignant neoplasm of upper-outer quadrant of right female breast: Secondary | ICD-10-CM

## 2023-02-26 NOTE — Therapy (Signed)
OUTPATIENT PHYSICAL THERAPY  UPPER EXTREMITY ONCOLOGY TREATMENT  Patient Name: Katrina Liu MRN: [id number] DOB:May 27, 1960, 62 y.o., female Today's Date: 02/26/2023  END OF SESSION:  PT End of Session - 02/26/23 0802     Visit Number 6    Number of Visits 9    Date for PT Re-Evaluation 02/27/23    PT Start Time 0803    PT Stop Time 0851    PT Time Calculation (min) 48 min    Activity Tolerance Patient tolerated treatment well    Behavior During Therapy [facility] for tasks assessed/performed              Past Medical History:  Diagnosis Date   Anemia    Breast cancer (HCC) 10/02/2018   right breast lumpectomy   Family history of prostate cancer    GERD (gastroesophageal reflux disease)    h/o - not now   Headache(784.0)    Hypertension    Personal history of chemotherapy    Personal history of radiation therapy    S/P abdominal hysterectomy 04/17/2011   Stroke (HCC)    no residual   Past Surgical History:  Procedure Laterality Date   ABDOMINAL HYSTERECTOMY  04/16/2011   Procedure: HYSTERECTOMY ABDOMINAL;  Surgeon: Serita Kyle, MD;  Location: [facility];  Service: Gynecology;  Laterality: N/A;   BREAST BIOPSY Left 2013   BREAST BIOPSY Right 2020   BREAST LUMPECTOMY Right 10/02/2018   BREAST LUMPECTOMY WITH RADIOACTIVE SEED AND SENTINEL LYMPH NODE BIOPSY Right 10/02/2018   Procedure: RIGHT BREAST LUMPECTOMY WITH RADIOACTIVE SEED AND RIGHT SENTINEL LYMPH NODE MAPPING;  Surgeon: Harriette Bouillon, MD;  Location:  SURGERY CENTER;  Service: General;  Laterality: Right;   KIDNEY STONE SURGERY     LAPAROTOMY     ectopic pregnancy   LOOP RECORDER IMPLANT N/A 12/24/2012   Procedure: LOOP RECORDER IMPLANT;  Surgeon: Duke Salvia, MD;  Location: [facility] CATH LAB;  Service: Cardiovascular;  Laterality: N/A;   LOOP RECORDER REMOVAL N/A 08/15/2016   Procedure: Loop Recorder Removal;  Surgeon: Duke Salvia, MD;  Location: [facility] INVASIVE CV LAB;  Service: Cardiovascular;   Laterality: N/A;   SHOULDER ARTHROSCOPY     TEE WITHOUT CARDIOVERSION N/A 12/24/2012   Procedure: TRANSESOPHAGEAL ECHOCARDIOGRAM (TEE);  Surgeon: Corky Crafts, MD;  Location: [facility] ENDOSCOPY;  Service: Cardiovascular;  Laterality: N/A;   Patient Active Problem List   Diagnosis Date Noted   Paroxysmal atrial fibrillation (HCC) 01/08/2022   Mixed hyperlipidemia 01/08/2022   Multinodular goiter 10/26/2020   Port-A-Cath in place 07/01/2018   Genetic testing 06/24/2018   Family history of prostate cancer    Malignant neoplasm of upper-outer quadrant of right breast in female, estrogen receptor negative (HCC) 06/02/2018   Recurrent oral ulcers 06/25/2017   Insomnia 06/03/2014   Migraine headache 12/25/2012   Cerebellar stroke syndrome 12/22/2012   Hypertension 12/22/2012   S/P abdominal hysterectomy 04/17/2011    PCP: Amada Kingfisher, FNP  REFERRING PROVIDER: Serena Croissant, MD  REFERRING DIAG: C50.411,Z17.1 (ICD-10-CM) - Malignant neoplasm of upper-outer quadrant of right breast in female, estrogen receptor negative (HCC)  THERAPY DIAG:  Disorder of the skin and subcutaneous tissue related to radiation, unspecified  Aftercare following surgery for neoplasm  Stiffness of right shoulder, not elsewhere classified  Malignant neoplasm of upper-outer quadrant of right breast in female, estrogen receptor negative [facility])  ONSET DATE: May 2023  Rationale for Evaluation and Treatment:  I called [facility] but didn't have a call back.  SUBJECTIVE:                                                                                                                                                                                           SUBJECTIVE STATEMENT: I did well after last visit. I tried the cupping at home. Night time in the bed seems to be the worst for me. I do the massaging. The shooting pains are not nearly like they were. I can sleep on my left side now but I have to position  just right. The pillow really didn't work. It was too big  EVAL  It started out as a small area of fat necrosis. It has progressed to an area of pain with shooting pain in to the axilla and breast. I could be just sitting and a sharp pain can just shoot through. It is hard to sleep at night. Random shooting pains  PERTINENT HISTORY: 05/29/2018 R breast cancer grade 3 IDC triple negative with a Ki-67 of 20%, T1BN0 stage Ib clinical stage. Completed neoadjuvant chemo. 10/02/2018- Right Lumpectomy ([facility]): no residual carcinoma s/p neoadjuvant treatment, 0/3 lymph nodes negative for carcinoma. 11/06/2018 - 12/22/2018 completed radiation. Pt reports in early 2023 she felt a lump that turned out to be fat necrosis.   PAIN:  Are you having pain? Yes NPRS scale: 3/10 with touch, and movement Pain location: R upper outer breast Pain orientation: Right  PAIN TYPE: aching and dull Pain description: constant  Aggravating factors: nothing Relieving factors: nothing  PRECAUTIONS: Other: R UE lymphedema risk  RED FLAGS: None   WEIGHT BEARING RESTRICTIONS: No  FALLS:  Has patient fallen in last 6 months? 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  COGNITION: Overall cognitive status: Within functional limits for tasks assessed   PALPATION: Area of fat necrosis palpable at R lateral outer breast, no cording palpable but could be deep  OBSERVATIONS / OTHER ASSESSMENTS: visible area of fat necrosis   POSTURE: forward head,  rounded shoulders  UPPER EXTREMITY AROM/PROM:  A/PROM RIGHT   eval   Shoulder extension 70  Shoulder flexion 158  Shoulder abduction 159  Shoulder internal rotation 49  Shoulder external rotation 74    (  Blank rows = not tested)  A/PROM LEFT   eval  Shoulder extension 70  Shoulder flexion 163  Shoulder abduction 165  Shoulder internal rotation 50  Shoulder external rotation 90    (Blank rows = not tested)  CERVICAL AROM: All within normal limits:       LYMPHEDEMA ASSESSMENTS:   LANDMARK RIGHT  eval  At axilla  28  15 cm proximal to olecranon process   10 cm proximal to olecranon process 24  Olecranon process 22.4  15 cm proximal to ulnar styloid process   10 cm proximal to ulnar styloid process 17  Just proximal to ulnar styloid process 14.5  Across hand at thumb web space 18.2  At base of 2nd digit 6  (Blank rows = not tested)  LANDMARK LEFT  eval  At axilla  30  15 cm proximal to olecranon process   10 cm proximal to olecranon process 24.9  Olecranon process 23.5  15 cm proximal to ulnar styloid process   10 cm proximal to ulnar styloid process 19.6  Just proximal to ulnar styloid process 14.7  Across hand at thumb web space 19.2  At base of 2nd digit 6.2  (Blank rows = not tested)    QUICK DASH SURVEY:       TODAY'S TREATMENT:                                                                                                                                          DATE:  02/27/2023  Several pieces of half inch foam for pt to make a donut shape out of for sleeping, and some large TG soft to use prn Pt did wall arcs and prayer stretch to loosen up x 3 ea Manual therapy in supine: STM initially with extremely light touch using 1 finger to area of fat necrosis then as it became more desensitized began to use more pressure and two fingers - doing circles then lightly pinning skin and stretching in all directions across area of necrosis - eventually  was able to use more pressure Suction cupping with cocoa butter to entire area - light traction, tilt, and moving in all directions    02/21/2023 Wall arc to the left x 3 prayer stretch x 4 on table Manual therapy in supine: STM initially with extremely light touch using 1 finger to area of fat necrosis then as it became more desensitized began to use more pressure and two fingers - doing circles then lightly pinning skin and stretching in all directions across area of necrosis - eventually was able to use more pressure Suction cupping with cocoa butter to entire area - light traction, tilt, and moving in all directions  02/18/23: Issued handout and instructed pt in nerve desensitization technique  Instructed pt in childs pose walking fingers to L side to stretch R trunk with pt feeling good stretch at  area of fat necrosis and down side Manual therapy in supine: STM initially with extremely light touch using 1 finger to area of fat necrosis then as it became more desensitized began to use more pressure and two fingers - doing circles then lightly pinning skin and stretching in all directions across area of necrosis - eventually was able to use more pressure Suction cupping with cocoa butter to entire area - light pulling upwards and moving in all directions - significant improvement noted in texture and sensitivity by end of session  02/04/2023  Wall arc to left x 3 Lat stretch at laundry cart x 3 MFR to axilla and upper arm cording present but not creating stretch PROM right shoulder flexion, scaption, abduction , ER, Discussed desensitization techniques using polar fleece, sherpa, flannel etc to try and desensitize area of fat necrosis Educated pt in MLD and gentleness of stretch using diagrams while explaining. Used gentle massage prior to MLD at area of fat necrosis In supine: Short neck, Right axillary LN's, 5 diaphragmatic breaths,  R inguinal nodes and establishment of  right  axilloi-nguinal pathway, then R axillo-inguinal pathway repeated multiple times in supine and in SL and ending with inguinal LN's. Pt practiced all parts in supine and in SL and used very good pressure and stretch   01/31/2023 Soft tissue mobilization to right UT, Pectorals, lateral trunk with cocoa butter PROM Right shoulder flexion, abduction, D2 flexion, and ER Lower trunk rotation x 4 knees left  Wall slide abd x 4, Wall arc to left x 4 Pectoral corner stretch 3x 20 sec, single arm pec stretch at doorway Updated HEP    01/30/23- cut 1/2 grey foam and placed in thick stockinette to wear over area of fat necrosis to see if this improves comfort    PATIENT EDUCATION:  Access Code: [email or web address] URL: [email or web address] Date: 01/31/2023 Prepared by: Alvira Monday  Exercises Wall slide abd - Doorway Pec Stretch at 90 Degrees Abduction  - 1 x daily - 7 x weekly - 1 sets - 3 reps - Corner Pec Major Stretch  - 1 x daily - 7 x weekly - 1 sets - 3 reps - Single Arm Doorway Pec Stretch at 90 Degrees Abduction  - 1 x daily - 7 x weekly - 1 sets - 3 reps - 20 hold Education details: cording, fat necrosis Person educated: Patient Education method: Explanation Education comprehension: verbalized understanding  HOME EXERCISE PROGRAM: Try wearing 1/2 grey foam over area of fat necrosis to see if this helps decrease pain Self massage Desensitization  ASSESSMENT:  CLINICAL IMPRESSION:  Continued manual work with pt to desensitize area of fat necrosis and continued cupping with traction, tilt, movement. Pt felt good afterwards and area of necrosis was again reduced. Pt was given some half inch foam to try and make a donut shaped cushion to wear with sleeping since night time is the worst for her  OBJECTIVE IMPAIRMENTS: increased fascial restrictions, postural dysfunction, and pain.   ACTIVITY LIMITATIONS: carrying, lifting, and reach over head  PARTICIPATION LIMITATIONS:  cleaning, community activity, and yard work  PERSONAL FACTORS: Time since onset of injury/illness/exacerbation are also affecting patient's functional outcome.   REHAB POTENTIAL: Good  CLINICAL DECISION MAKING: Stable/uncomplicated  EVALUATION COMPLEXITY: Low  GOALS: Goals reviewed with patient? Yes  SHORT TERM GOALS=LONG TERM GOALS Target date: 02/27/23  Pt will report a 50% decrease in pain in R breast to allow improved comfort.  Baseline: Goal status: INITIAL  2.  Pt will  report no tightness at end range R shoulder ROM to decrease discomfort. Baseline:  Goal status: INITIAL  3.  Pt will be independent in a home exercise program for continued stretching and strengthening.  Baseline:  Goal status: INITIAL  PLAN:  PT FREQUENCY: 2x/week  PT DURATION: 4 weeks  PLANNED INTERVENTIONS: Therapeutic exercises, Therapeutic activity, Patient/Family education, Self Care, Joint mobilization, Orthotic/Fit training, Manual lymph drainage, Compression bandaging, scar mobilization, Taping, Vasopneumatic device, and Manual therapy  PLAN FOR NEXT SESSION: Was pt able to make a donut for night time out of foam she was given?MLD and gentle manual to area of necrosis, how did desensitization do?, STM prn  Waynette Buttery, PT 02/26/2023, 8:57 AM

## 2023-02-28 ENCOUNTER — Ambulatory Visit: Payer: Managed Care, Other (non HMO) | Admitting: Physical Therapy

## 2023-02-28 ENCOUNTER — Encounter: Payer: Self-pay | Admitting: Physical Therapy

## 2023-02-28 DIAGNOSIS — C50411 Malignant neoplasm of upper-outer quadrant of right female breast: Secondary | ICD-10-CM | POA: Diagnosis not present

## 2023-02-28 DIAGNOSIS — M25611 Stiffness of right shoulder, not elsewhere classified: Secondary | ICD-10-CM

## 2023-02-28 DIAGNOSIS — L599 Disorder of the skin and subcutaneous tissue related to radiation, unspecified: Secondary | ICD-10-CM

## 2023-02-28 DIAGNOSIS — Z483 Aftercare following surgery for neoplasm: Secondary | ICD-10-CM

## 2023-02-28 NOTE — Therapy (Signed)
OUTPATIENT PHYSICAL THERAPY  UPPER EXTREMITY ONCOLOGY TREATMENT  Patient Name: Katrina Liu MRN: [id number] DOB:27-Oct-1960, 62 y.o., female Today's Date: 02/28/2023  END OF SESSION:  PT End of Session - 02/28/23 0903     Visit Number 7    Number of Visits 9    Date for PT Re-Evaluation 02/27/23    PT Start Time 0902    PT Stop Time 0951    PT Time Calculation (min) 49 min    Activity Tolerance Patient tolerated treatment well    Behavior During Therapy [facility] for tasks assessed/performed              Past Medical History:  Diagnosis Date   Anemia    Breast cancer (HCC) 10/02/2018   right breast lumpectomy   Family history of prostate cancer    GERD (gastroesophageal reflux disease)    h/o - not now   Headache(784.0)    Hypertension    Personal history of chemotherapy    Personal history of radiation therapy    S/P abdominal hysterectomy 04/17/2011   Stroke (HCC)    no residual   Past Surgical History:  Procedure Laterality Date   ABDOMINAL HYSTERECTOMY  04/16/2011   Procedure: HYSTERECTOMY ABDOMINAL;  Surgeon: Serita Kyle, MD;  Location: [facility];  Service: Gynecology;  Laterality: N/A;   BREAST BIOPSY Left 2013   BREAST BIOPSY Right 2020   BREAST LUMPECTOMY Right 10/02/2018   BREAST LUMPECTOMY WITH RADIOACTIVE SEED AND SENTINEL LYMPH NODE BIOPSY Right 10/02/2018   Procedure: RIGHT BREAST LUMPECTOMY WITH RADIOACTIVE SEED AND RIGHT SENTINEL LYMPH NODE MAPPING;  Surgeon: Harriette Bouillon, MD;  Location: [facility];  Service: General;  Laterality: Right;   KIDNEY STONE SURGERY     LAPAROTOMY     ectopic pregnancy   LOOP RECORDER IMPLANT N/A 12/24/2012   Procedure: LOOP RECORDER IMPLANT;  Surgeon: Duke Salvia, MD;  Location: [facility] CATH LAB;  Service: Cardiovascular;  Laterality: N/A;   LOOP RECORDER REMOVAL N/A 08/15/2016   Procedure: Loop Recorder Removal;  Surgeon: Duke Salvia, MD;  Location: [facility] INVASIVE CV LAB;  Service: Cardiovascular;   Laterality: N/A;   SHOULDER ARTHROSCOPY     TEE WITHOUT CARDIOVERSION N/A 12/24/2012   Procedure: TRANSESOPHAGEAL ECHOCARDIOGRAM (TEE);  Surgeon: Corky Crafts, MD;  Location: [facility] ENDOSCOPY;  Service: Cardiovascular;  Laterality: N/A;   Patient Active Problem List   Diagnosis Date Noted   Paroxysmal atrial fibrillation (HCC) 01/08/2022   Mixed hyperlipidemia 01/08/2022   Multinodular goiter 10/26/2020   Port-A-Cath in place 07/01/2018   Genetic testing 06/24/2018   Family history of prostate cancer    Malignant neoplasm of upper-outer quadrant of right breast in female, estrogen receptor negative (HCC) 06/02/2018   Recurrent oral ulcers 06/25/2017   Insomnia 06/03/2014   Migraine headache 12/25/2012   Cerebellar stroke syndrome 12/22/2012   Hypertension 12/22/2012   S/P abdominal hysterectomy 04/17/2011    PCP: Amada Kingfisher, FNP  REFERRING PROVIDER: Serena Croissant, MD  REFERRING DIAG: C50.411,Z17.1 (ICD-10-CM) - Malignant neoplasm of upper-outer quadrant of right breast in female, estrogen receptor negative (HCC)  THERAPY DIAG:  Disorder of the skin and subcutaneous tissue related to radiation, unspecified  Aftercare following surgery for neoplasm  Stiffness of right shoulder, not elsewhere classified  Malignant neoplasm of upper-outer quadrant of right breast in female, estrogen receptor negative [facility])  ONSET DATE: May 2023  Rationale for Evaluation and Treatment:  I called [facility] but didn't have a call back.  SUBJECTIVE:                                                                                                                                                                                           SUBJECTIVE STATEMENT: I am now able to sleep on my R side. The first night I layed on the piece of foam and it felt better. Last night I used heat and it wasn't too bad. The shooting pains have tremendously improved.    EVAL  It started out as a small area  of fat necrosis. It has progressed to an area of pain with shooting pain in to the axilla and breast. I could be just sitting and a sharp pain can just shoot through. It is hard to sleep at night. Random shooting pains  PERTINENT HISTORY: 05/29/2018 R breast cancer grade 3 IDC triple negative with a Ki-67 of 20%, T1BN0 stage Ib clinical stage. Completed neoadjuvant chemo. 10/02/2018- Right Lumpectomy ([facility]): no residual carcinoma s/p neoadjuvant treatment, 0/3 lymph nodes negative for carcinoma. 11/06/2018 - 12/22/2018 completed radiation. Pt reports in early 2023 she felt a lump that turned out to be fat necrosis.   PAIN:  Are you having pain? Yes NPRS scale: 3/10 with touch, and movement Pain location: R upper outer breast Pain orientation: Right  PAIN TYPE: aching and dull Pain description: constant  Aggravating factors: nothing Relieving factors: nothing  PRECAUTIONS: Other: R UE lymphedema risk  RED FLAGS: None   WEIGHT BEARING RESTRICTIONS: No  FALLS:  Has patient fallen in last 6 months? 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  COGNITION: Overall cognitive status: Within functional limits for tasks assessed   PALPATION: Area of fat necrosis palpable at R lateral outer breast, no cording palpable but could be deep  OBSERVATIONS / OTHER ASSESSMENTS: visible area of fat necrosis   POSTURE: forward head, rounded shoulders  UPPER EXTREMITY AROM/PROM:  A/PROM RIGHT   eval   Shoulder extension 70   Shoulder flexion 158  Shoulder abduction 159  Shoulder internal rotation 49  Shoulder external rotation 74    (Blank rows = not tested)  A/PROM LEFT   eval  Shoulder extension 70  Shoulder flexion  163  Shoulder abduction 165  Shoulder internal rotation 50  Shoulder external rotation 90    (Blank rows = not tested)  CERVICAL AROM: All within normal limits:       LYMPHEDEMA ASSESSMENTS:   LANDMARK RIGHT  eval  At axilla  28  15 cm proximal to olecranon process   10 cm proximal to olecranon process 24  Olecranon process 22.4  15 cm proximal to ulnar styloid process   10 cm proximal to ulnar styloid process 17  Just proximal to ulnar styloid process 14.5  Across hand at thumb web space 18.2  At base of 2nd digit 6  (Blank rows = not tested)  LANDMARK LEFT  eval  At axilla  30  15 cm proximal to olecranon process   10 cm proximal to olecranon process 24.9  Olecranon process 23.5  15 cm proximal to ulnar styloid process   10 cm proximal to ulnar styloid process 19.6  Just proximal to ulnar styloid process 14.7  Across hand at thumb web space 19.2  At base of 2nd digit 6.2  (Blank rows = not tested)    QUICK DASH SURVEY:       TODAY'S TREATMENT:                                                                                                                                          DATE:  02/28/2023 Educated pt in lymphedema risk reduction practices including not using heat over area of fat necrosis Educated pt about ABC class and issued handout and signed pt up to attend in November Educated pt in proper way to progress exercises by initially beginning with 1 set of 10 reps at 1 lb then working up to 2 sets of 10 and eventually 3 sets of 10 and once at 3 sets of 10 can go up 1 lbs STM initially with extremely light touch using 1 finger to area of fat necrosis then as it became more desensitized began to use more pressure and two fingers - doing circles then  lightly pinning skin and stretching in all directions across area of necrosis - eventually was able to use more pressure Suction cupping with cocoa butter to entire area - light traction, tilt, and moving in all directions   02/27/2023  Several pieces of half inch foam for pt to make a donut shape out of for sleeping, and some large TG soft to use prn Pt did wall arcs and prayer stretch to loosen up x 3 ea Manual therapy in supine: STM initially with extremely light touch using 1 finger to area of fat necrosis then as it became more desensitized began to use more pressure and two fingers - doing circles then lightly pinning skin and stretching in all directions across area of necrosis - eventually was able to use more pressure Suction cupping with cocoa butter to entire  area - light traction, tilt, and moving in all directions    02/21/2023 Wall arc to the left x 3 prayer stretch x 4 on table Manual therapy in supine: STM initially with extremely light touch using 1 finger to area of fat necrosis then as it became more desensitized began to use more pressure and two fingers - doing circles then lightly pinning skin and stretching in all directions across area of necrosis - eventually was able to use more pressure Suction cupping with cocoa butter to entire area - light traction, tilt, and moving in all directions  02/18/23: Issued handout and instructed pt in nerve desensitization technique  Instructed pt in childs pose walking fingers to L side to stretch R trunk with pt feeling good stretch at area of fat necrosis and down side Manual therapy in supine: STM initially with extremely light touch using 1 finger to area of fat necrosis then as it became more desensitized began to use more pressure and two fingers - doing circles then lightly pinning skin and stretching in all directions across area of necrosis - eventually was able to use more pressure Suction cupping with cocoa butter to entire  area - light pulling upwards and moving in all directions - significant improvement noted in texture and sensitivity by end of session  02/04/2023  Wall arc to left x 3 Lat stretch at laundry cart x 3 MFR to axilla and upper arm cording present but not creating stretch PROM right shoulder flexion, scaption, abduction , ER, Discussed desensitization techniques using polar fleece, sherpa, flannel etc to try and desensitize area of fat necrosis Educated pt in MLD and gentleness of stretch using diagrams while explaining. Used gentle massage prior to MLD at area of fat necrosis In supine: Short neck, Right axillary LN's, 5 diaphragmatic breaths,  R inguinal nodes and establishment of  right axilloi-nguinal pathway, then R axillo-inguinal pathway repeated multiple times in supine and in SL and ending with inguinal LN's. Pt practiced all parts in supine and in SL and used very good pressure and stretch   01/31/2023 Soft tissue mobilization to right UT, Pectorals, lateral trunk with cocoa butter PROM Right shoulder flexion, abduction, D2 flexion, and ER Lower trunk rotation x 4 knees left  Wall slide abd x 4, Wall arc to left x 4 Pectoral corner stretch 3x 20 sec, single arm pec stretch at doorway Updated HEP    01/30/23- cut 1/2 grey foam and placed in thick stockinette to wear over area of fat necrosis to see if this improves comfort    PATIENT EDUCATION:  Access Code: [email or web address] URL: [email or web address] Date: 01/31/2023 Prepared by: Alvira Monday  Exercises Wall slide abd - Doorway Pec Stretch at 90 Degrees Abduction  - 1 x daily - 7 x weekly - 1 sets - 3 reps - Corner Pec Major Stretch  - 1 x daily - 7 x weekly - 1 sets - 3 reps - Single Arm Doorway Pec Stretch at 90 Degrees Abduction  - 1 x daily - 7 x weekly - 1 sets - 3 reps - 20 hold Education details: cording, fat necrosis Person educated: Patient Education method: Explanation Education comprehension: verbalized  understanding  HOME EXERCISE PROGRAM: Try wearing 1/2 grey foam over area of fat necrosis to see if this helps decrease pain Self massage Desensitization  ASSESSMENT:  CLINICAL IMPRESSION: Pt reports the sharp, shooting pains have resolved. She still has discomfort to touch initially but quickly desensitizes. She has some end range tightness  especially after waking in the morning but is independent in her stretches. She is pleased with her progress in therapy and has met 2 of her 3 goals and has partially met the other goal. She is now ready for discharge from skilled PT services.   OBJECTIVE IMPAIRMENTS: increased fascial restrictions, postural dysfunction, and pain.   ACTIVITY LIMITATIONS: carrying, lifting, and reach over head  PARTICIPATION LIMITATIONS: cleaning, community activity, and yard work  PERSONAL FACTORS: Time since onset of injury/illness/exacerbation are also affecting patient's functional outcome.   REHAB POTENTIAL: Good  CLINICAL DECISION MAKING: Stable/uncomplicated  EVALUATION COMPLEXITY: Low  GOALS: Goals reviewed with patient? Yes  SHORT TERM GOALS=LONG TERM GOALS Target date: 02/27/23  Pt will report a 50% decrease in pain in R breast to allow improved comfort.  Baseline: Goal status: 02/28/24: 80% decrease  2.  Pt will report no tightness at end range R shoulder ROM to decrease discomfort. Baseline:  Goal status: PARTIALLY MET : improved but not completely gone  3.  Pt will be independent in a home exercise program for continued stretching and strengthening.  Baseline:  Goal status: 02/28/23: MET  PLAN:  PT FREQUENCY: 2x/week  PT DURATION: 4 weeks  PLANNED INTERVENTIONS: Therapeutic exercises, Therapeutic activity, Patient/Family education, Self Care, Joint mobilization, Orthotic/Fit training, Manual lymph drainage, Compression bandaging, scar mobilization, Taping, Vasopneumatic device, and Manual therapy  PLAN FOR NEXT SESSION: d/c this  visit  Leonette Most, PT 02/28/2023, 10:42 AM  PHYSICAL THERAPY DISCHARGE SUMMARY  Visits from Start of Care: 7  Current functional level related to goals / functional outcomes: See above, goals met and 1 partially met   Remaining deficits: Some remaining end range tightness    Education / Equipment: HEP, desensitization techniques, suction cupping   Patient agrees to discharge. Patient goals were partially met. Patient is being discharged due to being pleased with the current functional level.  Milagros Loll [place], [place] 02/28/23 10:42 AM

## 2023-03-11 ENCOUNTER — Encounter: Payer: Self-pay | Admitting: Hematology and Oncology

## 2023-03-25 ENCOUNTER — Ambulatory Visit: Payer: Managed Care, Other (non HMO) | Admitting: Nurse Practitioner

## 2023-03-26 NOTE — Progress Notes (Unsigned)
Madelaine Bhat, CMA,acting as a Neurosurgeon for Arnette Felts, FNP.,have documented all relevant documentation on the behalf of Arnette Felts, FNP,as directed by  Arnette Felts, FNP while in the presence of Arnette Felts, FNP.  Subjective:  Patient ID: Katrina Liu , female    DOB: 1960/11/21 , 62 y.o.   MRN: [id number]  No chief complaint on file.   HPI  Patient presents today for a bp and dm follow up, Patient reports compliance with medication. Patient denies any chest pain, SOB, or headaches. Patient has no concerns today.     Past Medical History:  Diagnosis Date  . Anemia   . Breast cancer (HCC) 10/02/2018   right breast lumpectomy  . Family history of prostate cancer   . GERD (gastroesophageal reflux disease)    h/o - not now  . Headache(784.0)   . Hypertension   . Personal history of chemotherapy   . Personal history of radiation therapy   . S/P abdominal hysterectomy 04/17/2011  . Stroke [facility])    no residual     Family History  Problem Relation Age of Onset  . Cancer Father   . Stroke Father 73  . Lung cancer Father   . Prostate cancer Paternal Uncle        dx 34's?  . Diabetes Maternal Grandfather   . Breast cancer Neg Hx      Current Outpatient Medications:  .  aspirin EC 81 MG EC tablet, Take 1 tablet (81 mg total) by mouth daily., Disp: 90 tablet, Rfl: 0 .  calcium carbonate (OSCAL) 1500 (600 Ca) MG TABS tablet, Take by mouth 2 (two) times daily with a meal., Disp: , Rfl:  .  chlorthalidone (HYGROTON) 25 MG tablet, Take 1 tablet (25 mg total) by mouth daily., Disp: 90 tablet, Rfl: 1 .  Cholecalciferol (VITAMIN D3) 50 MCG (2000 UT) TABS, Take by mouth., Disp: , Rfl:  .  Cyanocobalamin (VITAMIN B-12 CR PO), Take by mouth. , Disp: , Rfl:  .  gabapentin (NEURONTIN) 100 MG capsule, Take 1-3 capsules at night as needed, Disp: , Rfl:  .  labetalol (NORMODYNE) 100 MG tablet, TAKE 1 TABLET BY MOUTH 2 TIMES A DAY, Disp: 180 tablet, Rfl: 1 .  mometasone (ELOCON)  0.1 % ointment, Apply 1 application topically as needed (dry skin). , Disp: , Rfl:  .  Multiple Vitamin (MULTIVITAMIN ADULT PO), Take by mouth., Disp: , Rfl:  .  potassium chloride SA (KLOR-CON M20) 20 MEQ tablet, Take 1 tablet (20 mEq total) by mouth daily., Disp: 90 tablet, Rfl: 1 .  vitamin E 180 MG (400 UNITS) capsule, Take 400 Units by mouth daily., Disp: , Rfl:    Allergies  Allergen Reactions  . Lisinopril Swelling     Review of Systems   There were no vitals filed for this visit. There is no height or weight on file to calculate BMI.  Wt Readings from Last 3 Encounters:  11/20/22 138 lb 3.2 oz (62.7 kg)  11/08/22 139 lb 3.2 oz (63.1 kg)  07/17/22 135 lb 3.2 oz (61.3 kg)    The ASCVD Risk score (Arnett DK, et al., 2019) failed to calculate for the following reasons:   The patient has a prior MI or stroke diagnosis  Objective:  Physical Exam      Assessment And Plan:  Primary hypertension  Mixed hyperlipidemia    No follow-ups on file.  Patient was given opportunity to ask questions. Patient verbalized understanding of the plan  and was able to repeat key elements of the plan. All questions were answered to their satisfaction.    Jeanell Sparrow, FNP, have reviewed all documentation for this visit. The documentation on 03/26/23 for the exam, diagnosis, procedures, and orders are all accurate and complete.   IF YOU HAVE BEEN REFERRED TO A SPECIALIST, IT MAY TAKE 1-2 WEEKS TO SCHEDULE/PROCESS THE REFERRAL. IF YOU HAVE NOT HEARD FROM US/SPECIALIST IN TWO WEEKS, PLEASE GIVE [place] A CALL AT [phone].

## 2023-03-27 ENCOUNTER — Ambulatory Visit: Payer: Managed Care, Other (non HMO) | Admitting: Nurse Practitioner

## 2023-03-27 VITALS — BP 124/60 | HR 75 | Temp 98.7°F | Ht 65.0 in | Wt 140.8 lb

## 2023-03-27 DIAGNOSIS — I10 Essential (primary) hypertension: Secondary | ICD-10-CM

## 2023-03-27 DIAGNOSIS — Z2821 Immunization not carried out because of patient refusal: Secondary | ICD-10-CM | POA: Insufficient documentation

## 2023-03-27 DIAGNOSIS — E782 Mixed hyperlipidemia: Secondary | ICD-10-CM | POA: Diagnosis not present

## 2023-03-27 NOTE — Assessment & Plan Note (Signed)
Declines shingrix, educated on disease process and is aware if he changes his mind to notify office  

## 2023-03-27 NOTE — Assessment & Plan Note (Signed)

## 2023-03-27 NOTE — Assessment & Plan Note (Signed)

## 2023-03-27 NOTE — Assessment & Plan Note (Signed)
Cholesterol levels were stable, continue low fat diet. She has not been walking regularly.

## 2023-03-27 NOTE — Assessment & Plan Note (Signed)
Blood pressure is well controlled, continue current medications.

## 2023-03-28 LAB — BMP8+EGFR
BUN/Creatinine Ratio: 16 (ref 12–28)
BUN: 12 mg/dL (ref 8–27)
CO2: 27 mmol/L (ref 20–29)
Calcium: 9.8 mg/dL (ref 8.7–10.3)
Chloride: 102 mmol/L (ref 96–106)
Creatinine, Ser: 0.76 mg/dL (ref 0.57–1.00)
Glucose: 55 mg/dL — ABNORMAL LOW (ref 70–99)
Potassium: 3.7 mmol/L (ref 3.5–5.2)
Sodium: 144 mmol/L (ref 134–144)
eGFR: 88 mL/min/{1.73_m2} (ref >59)

## 2023-03-28 LAB — LIPID PANEL
Chol/HDL Ratio: 3.4 {ratio} (ref 0.0–4.4)
Cholesterol, Total: 170 mg/dL (ref 100–199)
HDL: 50 mg/dL (ref >39)
LDL Chol Calc (NIH): 103 mg/dL — ABNORMAL HIGH (ref 0–99)
Triglycerides: 90 mg/dL (ref 0–149)
VLDL Cholesterol Cal: 17 mg/dL (ref 5–40)

## 2023-04-01 ENCOUNTER — Other Ambulatory Visit: Payer: Self-pay | Admitting: Nurse Practitioner

## 2023-04-01 DIAGNOSIS — I10 Essential (primary) hypertension: Secondary | ICD-10-CM

## 2023-04-05 ENCOUNTER — Other Ambulatory Visit: Payer: Self-pay

## 2023-04-05 DIAGNOSIS — I10 Essential (primary) hypertension: Secondary | ICD-10-CM

## 2023-04-05 MED ORDER — CHLORTHALIDONE 25 MG PO TABS: 25.0000 mg | ORAL_TABLET | Freq: Every day | ORAL | 1 refills | Status: DC

## 2023-04-09 ENCOUNTER — Ambulatory Visit (INDEPENDENT_AMBULATORY_CARE_PROVIDER_SITE_OTHER): Payer: Managed Care, Other (non HMO)

## 2023-04-09 VITALS — BP 130/90 | HR 86 | Temp 98.2°F | Ht 65.0 in | Wt 140.0 lb

## 2023-04-09 DIAGNOSIS — I10 Essential (primary) hypertension: Secondary | ICD-10-CM

## 2023-04-09 DIAGNOSIS — Z23 Encounter for immunization: Secondary | ICD-10-CM

## 2023-04-09 NOTE — Addendum Note (Signed)
Addended by: Argentina Ponder on: 04/09/2023 10:09 AM   Modules accepted: Orders

## 2023-04-09 NOTE — Progress Notes (Signed)
Patient presents today for covid vaccine. I checked patient's blood pressure and it was 130/90 P86. I had patient wait 10 minutes and her bp was 130/80 P66. She reports she takes chlorthalidone 25mg  and labetalol 100mg  in the mornings and labetalol 100mg  in the evenings. Patient is to continue with her current medications keep her follow up as scheduled. YL,RMA

## 2023-05-28 ENCOUNTER — Ambulatory Visit
Admission: RE | Admit: 2023-05-28 | Discharge: 2023-05-28 | Disposition: A | Discharge status: Home / Self Care | Payer: Managed Care, Other (non HMO) | Source: Ambulatory Visit | Attending: Family Medicine | Admitting: Family Medicine

## 2023-05-28 ENCOUNTER — Ambulatory Visit: Payer: Managed Care, Other (non HMO) | Admitting: Nurse Practitioner

## 2023-05-28 ENCOUNTER — Ambulatory Visit: Payer: Managed Care, Other (non HMO)

## 2023-05-28 VITALS — BP 117/73 | HR 76 | Temp 99.0°F | Resp 20

## 2023-05-28 DIAGNOSIS — B349 Viral infection, unspecified: Secondary | ICD-10-CM | POA: Diagnosis present

## 2023-05-28 DIAGNOSIS — R109 Unspecified abdominal pain: Secondary | ICD-10-CM | POA: Insufficient documentation

## 2023-05-28 LAB — POCT URINALYSIS DIP (MANUAL ENTRY)
Bilirubin, UA: NEGATIVE
Blood, UA: NEGATIVE
Glucose, UA: NEGATIVE mg/dL
Ketones, POC UA: NEGATIVE mg/dL
Leukocytes, UA: NEGATIVE
Nitrite, UA: NEGATIVE
Protein Ur, POC: NEGATIVE mg/dL
Spec Grav, UA: 1.015
Urobilinogen, UA: 0.2 U/dL
pH, UA: 6

## 2023-05-28 MED ORDER — IBUPROFEN 600 MG PO TABS: 600.0000 mg | ORAL_TABLET | Freq: Four times a day (QID) | ORAL | 0 refills | Status: DC | PRN

## 2023-05-28 MED ORDER — CETIRIZINE HCL 10 MG PO TABS: 10.0000 mg | ORAL_TABLET | Freq: Every day | ORAL | 0 refills | Status: DC

## 2023-05-28 MED ORDER — CYCLOBENZAPRINE HCL 5 MG PO TABS: 5.0000 mg | ORAL_TABLET | Freq: Every evening | ORAL | 0 refills | Status: DC | PRN

## 2023-05-28 MED ORDER — PSEUDOEPHEDRINE HCL 30 MG PO TABS: 30.0000 mg | ORAL_TABLET | Freq: Three times a day (TID) | ORAL | 0 refills | Status: DC | PRN

## 2023-05-28 MED ORDER — PROMETHAZINE-DM 6.25-15 MG/5ML PO SYRP: 5.0000 mL | ORAL_SOLUTION | Freq: Three times a day (TID) | ORAL | 0 refills | Status: DC | PRN

## 2023-05-28 NOTE — ED Triage Notes (Signed)
Pt c/o cough, head congestion, hoarse-sx started yesterday-taking mucinex-also c/o left flank pain x 2 weeks-no recent pain meds-NAD-steady gait

## 2023-05-28 NOTE — Discharge Instructions (Signed)
We will notify you of your test results as they arrive and may take between about 24 hours.  I encourage you to sign up for MyChart if you have not already done so as this can be the easiest way for [place] to communicate results to you online or through a phone app.  Generally, we only contact you if it is a positive test result.  In the meantime, if you develop worsening symptoms including fever, chest pain, shortness of breath despite our current treatment plan then please report to the emergency room as this may be a sign of worsening status from possible viral infection.  Otherwise, we will manage this as a viral syndrome. For sore throat or cough try using a honey-based tea. Use 3 teaspoons of honey with juice squeezed from half lemon. Place shaved pieces of ginger into 1/2-1 cup of water and warm over stove top. Then mix the ingredients and repeat every 4 hours as needed. Please take Tylenol 500mg -650mg  every 6 hours for aches and pains, fevers. Hydrate very well with at least 2 liters of water. Eat light meals such as soups to replenish electrolytes and soft fruits, veggies. Start an antihistamine like Zyrtec (10mg  daily) for postnasal drainage, sinus congestion.  You can take this together with pseudoephedrine (Sudafed) at a dose of 30 mg 2-3 times a day as needed for the same kind of congestion.  Use the cough medications as needed.

## 2023-05-28 NOTE — ED Provider Notes (Signed)
[facility]  Note:  This document was prepared using Conservation officer, historic buildings and may include unintentional dictation errors.  MRN: [id number] DOB: [birth year]  Subjective:   Katrina Liu is a 63 y.o. female presenting for 1 day history of sinus congestion, chest congestion, hoarseness, coughing.  Also has a history of kidney stones and has had intermittent left flank pain, left-sided low back pain.  No fall, trauma, numbness or tingling, saddle paresthesia, changes to bowel or urinary habits, radicular symptoms.   No current facility-administered medications for this encounter.  Current Outpatient Medications:    amoxicillin-clavulanate (AUGMENTIN) 875-125 MG tablet, Take 1 tablet by mouth 2 (two) times daily., Disp: , Rfl:    aspirin EC 81 MG EC tablet, Take 1 tablet (81 mg total) by mouth daily., Disp: 90 tablet, Rfl: 0   calcium carbonate (OSCAL) 1500 (600 Ca) MG TABS tablet, Take by mouth 2 (two) times daily with a meal., Disp: , Rfl:    chlorthalidone (HYGROTON) 25 MG tablet, Take 1 tablet (25 mg total) by mouth daily., Disp: 90 tablet, Rfl: 1   Cholecalciferol (VITAMIN D3) 50 MCG (2000 UT) TABS, Take by mouth., Disp: , Rfl:    Cyanocobalamin (VITAMIN B-12 CR PO), Take by mouth. , Disp: , Rfl:    gabapentin (NEURONTIN) 100 MG capsule, Take 1-3 capsules at night as needed, Disp: , Rfl:    labetalol (NORMODYNE) 100 MG tablet, TAKE 1 TABLET BY MOUTH 2 TIMES A DAY, Disp: 180 tablet, Rfl: 1   mometasone (ELOCON) 0.1 % ointment, Apply 1 application topically as needed (dry skin). , Disp: , Rfl:    Multiple Vitamin (MULTIVITAMIN ADULT PO), Take by mouth., Disp: , Rfl:    potassium chloride SA (KLOR-CON M20) 20 MEQ tablet, Take 1 tablet (20 mEq total) by mouth daily., Disp: 90 tablet, Rfl: 1   vitamin E 180 MG (400 UNITS) capsule, Take 400 Units by mouth daily., Disp: , Rfl:    Allergies  Allergen Reactions   Lisinopril Swelling    Past Medical  History:  Diagnosis Date   Anemia    Breast cancer (HCC) 10/02/2018   right breast lumpectomy   Family history of prostate cancer    GERD (gastroesophageal reflux disease)    h/o - not now   Headache(784.0)    Hypertension    Personal history of chemotherapy    Personal history of radiation therapy    S/P abdominal hysterectomy 04/17/2011   Stroke [facility])    no residual     Past Surgical History:  Procedure Laterality Date   ABDOMINAL HYSTERECTOMY  04/16/2011   Procedure: HYSTERECTOMY ABDOMINAL;  Surgeon: Serita Kyle, MD;  Location: [facility];  Service: Gynecology;  Laterality: N/A;   BREAST BIOPSY Left 2013   BREAST BIOPSY Right 2020   BREAST LUMPECTOMY Right 10/02/2018   BREAST LUMPECTOMY WITH RADIOACTIVE SEED AND SENTINEL LYMPH NODE BIOPSY Right 10/02/2018   Procedure: RIGHT BREAST LUMPECTOMY WITH RADIOACTIVE SEED AND RIGHT SENTINEL LYMPH NODE MAPPING;  Surgeon: Harriette Bouillon, MD;  Location: [facility];  Service: General;  Laterality: Right;   KIDNEY STONE SURGERY     LAPAROTOMY     ectopic pregnancy   LOOP RECORDER IMPLANT N/A 12/24/2012   Procedure: LOOP RECORDER IMPLANT;  Surgeon: Duke Salvia, MD;  Location: [facility] CATH LAB;  Service: Cardiovascular;  Laterality: N/A;   LOOP RECORDER REMOVAL N/A 08/15/2016   Procedure: Loop Recorder Removal;  Surgeon: Duke Salvia, MD;  Location: [facility] INVASIVE CV LAB;  Service: Cardiovascular;  Laterality: N/A;   SHOULDER ARTHROSCOPY     TEE WITHOUT CARDIOVERSION N/A 12/24/2012   Procedure: TRANSESOPHAGEAL ECHOCARDIOGRAM (TEE);  Surgeon: Corky Crafts, MD;  Location: [facility] ENDOSCOPY;  Service: Cardiovascular;  Laterality: N/A;    Family History  Problem Relation Age of Onset   Cancer Father    Stroke Father 56   Lung cancer Father    Prostate cancer Paternal Uncle        dx 53's?   Diabetes Maternal Grandfather    Breast cancer Neg Hx     Social History   Tobacco Use   Smoking status: Never   Smokeless  tobacco: Never  Vaping Use   Vaping status: Never Used  Substance Use Topics   Alcohol use: No    Alcohol/week: 0.0 standard drinks of alcohol   Drug use: No    ROS   Objective:   Vitals: BP 117/73 (BP Location: Right Arm)   Pulse 76   Temp 99 F (37.2 C) (Oral)   Resp 20   LMP 04/16/2011   SpO2 97%   Physical Exam Constitutional:      General: She is not in acute distress.    Appearance: Normal appearance. She is well-developed and normal weight. She is not ill-appearing, toxic-appearing or diaphoretic.  HENT:     Head: Normocephalic and atraumatic.     Right Ear: Tympanic membrane, ear canal and external ear normal. No drainage or tenderness. No middle ear effusion. There is no impacted cerumen. Tympanic membrane is not erythematous or bulging.     Left Ear: Tympanic membrane, ear canal and external ear normal. No drainage or tenderness.  No middle ear effusion. There is no impacted cerumen. Tympanic membrane is not erythematous or bulging.     Nose: Nose normal. No congestion or rhinorrhea.     Mouth/Throat:     Mouth: Mucous membranes are moist. No oral lesions.     Pharynx: Oropharynx is clear. No pharyngeal swelling, oropharyngeal exudate, posterior oropharyngeal erythema or uvula swelling.     Tonsils: No tonsillar exudate or tonsillar abscesses.  Eyes:     General: No scleral icterus.       Right eye: No discharge.        Left eye: No discharge.     Extraocular Movements: Extraocular movements intact.     Right eye: Normal extraocular motion.     Left eye: Normal extraocular motion.     Conjunctiva/sclera: Conjunctivae normal.  Cardiovascular:     Rate and Rhythm: Normal rate and regular rhythm.     Heart sounds: Normal heart sounds. No murmur heard.    No friction rub. No gallop.  Pulmonary:     Effort: Pulmonary effort is normal. No respiratory distress.     Breath sounds: No stridor. No wheezing, rhonchi or rales.  Chest:     Chest wall: No tenderness.   Abdominal:     General: Bowel sounds are normal. There is no distension.     Palpations: Abdomen is soft. There is no mass.     Tenderness: There is no abdominal tenderness. There is no right CVA tenderness, left CVA tenderness, guarding or rebound.  Musculoskeletal:     Cervical back: Normal range of motion and neck supple.     Thoracic back: No swelling, edema, deformity, signs of trauma, lacerations, spasms, tenderness or bony tenderness. Normal range of motion. No scoliosis.     Lumbar back: Tenderness present.  No swelling, edema, deformity, signs of trauma, lacerations, spasms or bony tenderness. Normal range of motion. Negative right straight leg raise test and negative left straight leg raise test. No scoliosis.       Back:  Lymphadenopathy:     Cervical: No cervical adenopathy.  Skin:    General: Skin is warm and dry.  Neurological:     General: No focal deficit present.     Mental Status: She is alert and oriented to person, place, and time.  Psychiatric:        Mood and Affect: Mood normal.        Behavior: Behavior normal.        Thought Content: Thought content normal.        Judgment: Judgment normal.     Results for orders placed or performed during the hospital encounter of 05/28/23 (from the past 24 hours)  POCT urinalysis dipstick     Status: Normal   Collection Time: 05/28/23  2:01 PM  Result Value Ref Range   Color, UA yellow    Clarity, UA clear    Glucose, UA negative mg/dL   Bilirubin, UA negative    Ketones, POC UA negative mg/dL   Spec Grav, UA 3.086    Blood, UA negative    pH, UA 6.0    Protein Ur, POC negative mg/dL   Urobilinogen, UA 0.2 E.U./dL   Nitrite, UA Negative    Leukocytes, UA Negative     Assessment and Plan :   PDMP not reviewed this encounter.  1. Acute viral syndrome   2. Left flank pain    Urinalysis reassuring.  Will defer urine culture.  Hydrate well monitor for symptoms.  Will manage for viral illness such as viral URI,  viral syndrome, viral rhinitis, COVID-19. Recommended supportive care. Offered scripts for symptomatic relief. Testing is pending. Counseled patient on potential for adverse effects with medications prescribed/recommended today, ER and return-to-clinic precautions discussed, patient verbalized understanding.     Wallis Bamberg, PA-C 05/28/23 2014

## 2023-05-29 ENCOUNTER — Ambulatory Visit: Payer: Managed Care, Other (non HMO)

## 2023-05-29 LAB — SARS CORONAVIRUS 2 (TAT 6-24 HRS): SARS Coronavirus 2: NEGATIVE

## 2023-06-21 ENCOUNTER — Encounter: Payer: Self-pay | Admitting: Nurse Practitioner

## 2023-06-26 ENCOUNTER — Ambulatory Visit: Payer: Self-pay | Admitting: Nurse Practitioner

## 2023-07-01 ENCOUNTER — Other Ambulatory Visit: Payer: Self-pay | Admitting: Obstetrics and Gynecology

## 2023-07-01 DIAGNOSIS — Z1231 Encounter for screening mammogram for malignant neoplasm of breast: Secondary | ICD-10-CM

## 2023-07-01 DIAGNOSIS — Z9889 Other specified postprocedural states: Secondary | ICD-10-CM

## 2023-07-01 DIAGNOSIS — Z853 Personal history of malignant neoplasm of breast: Secondary | ICD-10-CM

## 2023-07-22 ENCOUNTER — Ambulatory Visit (HOSPITAL_COMMUNITY)
Admission: RE | Admit: 2023-07-22 | Discharge: 2023-07-22 | Disposition: A | Discharge status: Home / Self Care | Source: Ambulatory Visit | Attending: Family Medicine | Admitting: Family Medicine

## 2023-07-22 ENCOUNTER — Encounter (HOSPITAL_COMMUNITY): Payer: Self-pay

## 2023-07-22 VITALS — BP 115/76 | HR 73 | Temp 98.3°F | Resp 16

## 2023-07-22 DIAGNOSIS — J0100 Acute maxillary sinusitis, unspecified: Secondary | ICD-10-CM | POA: Diagnosis not present

## 2023-07-22 MED ORDER — PREDNISONE 20 MG PO TABS: 40.0000 mg | ORAL_TABLET | Freq: Every day | ORAL | 0 refills | Status: AC

## 2023-07-22 MED ORDER — AMOXICILLIN-POT CLAVULANATE 875-125 MG PO TABS: 1.0000 | ORAL_TABLET | Freq: Two times a day (BID) | ORAL | 0 refills | Status: AC

## 2023-07-22 NOTE — ED Provider Notes (Signed)
 [facility]    CSN: [id number] Arrival date & time: 07/22/23  1227      History   Chief Complaint Chief Complaint  Patient presents with   Nasal Congestion    I was diagnosed with the flu last Wednesday. I have been fever free since Saturday. I also have a cough. At night is the worse as breathing happens only through my mouth which causes dry mouth. My right eye is more red than usual. - Entered by patient    HPI Katrina Liu is a 63 y.o. female.   Patient is here for URI symptoms.  She was dx with the flu last week Wednesday.  Given tamiflu and tessalon.  She was fever free for the last 3 days, but she continues with cough and congestion.  She started with cough and congestion about 2 weeks ago.  Having sinus/pressure with headache.  No wheezing or sob noted.  No n/v.  She has used delsym, zyrtec-d, and afrin.  She has since stopped those.         Past Medical History:  Diagnosis Date   Anemia    Breast cancer (HCC) 10/02/2018   right breast lumpectomy   Family history of prostate cancer    GERD (gastroesophageal reflux disease)    h/o - not now   Headache(784.0)    Hypertension    Personal history of chemotherapy    Personal history of radiation therapy    S/P abdominal hysterectomy 04/17/2011   Stroke [facility])    no residual    Patient Active Problem List   Diagnosis Date Noted   Herpes zoster vaccination declined 03/27/2023   COVID-19 vaccination declined 03/27/2023   Influenza vaccination declined 03/27/2023   Paroxysmal atrial fibrillation (HCC) 01/08/2022   Mixed hyperlipidemia 01/08/2022   Multinodular goiter 10/26/2020   Port-A-Cath in place 07/01/2018   Genetic testing 06/24/2018   Family history of prostate cancer    Malignant neoplasm of upper-outer quadrant of right breast in female, estrogen receptor negative (HCC) 06/02/2018   Recurrent oral ulcers 06/25/2017   Insomnia 06/03/2014   Migraine headache 12/25/2012   Cerebellar  stroke syndrome 12/22/2012   Hypertension 12/22/2012   S/P abdominal hysterectomy 04/17/2011    Past Surgical History:  Procedure Laterality Date   ABDOMINAL HYSTERECTOMY  04/16/2011   Procedure: HYSTERECTOMY ABDOMINAL;  Surgeon: Serita Kyle, MD;  Location: [facility];  Service: Gynecology;  Laterality: N/A;   BREAST BIOPSY Left 2013   BREAST BIOPSY Right 2020   BREAST LUMPECTOMY Right 10/02/2018   BREAST LUMPECTOMY WITH RADIOACTIVE SEED AND SENTINEL LYMPH NODE BIOPSY Right 10/02/2018   Procedure: RIGHT BREAST LUMPECTOMY WITH RADIOACTIVE SEED AND RIGHT SENTINEL LYMPH NODE MAPPING;  Surgeon: Harriette Bouillon, MD;  Location: [facility];  Service: General;  Laterality: Right;   KIDNEY STONE SURGERY     LAPAROTOMY     ectopic pregnancy   LOOP RECORDER IMPLANT N/A 12/24/2012   Procedure: LOOP RECORDER IMPLANT;  Surgeon: Duke Salvia, MD;  Location: [facility] CATH LAB;  Service: Cardiovascular;  Laterality: N/A;   LOOP RECORDER REMOVAL N/A 08/15/2016   Procedure: Loop Recorder Removal;  Surgeon: Duke Salvia, MD;  Location: [facility] INVASIVE CV LAB;  Service: Cardiovascular;  Laterality: N/A;   SHOULDER ARTHROSCOPY     TEE WITHOUT CARDIOVERSION N/A 12/24/2012   Procedure: TRANSESOPHAGEAL ECHOCARDIOGRAM (TEE);  Surgeon: Corky Crafts, MD;  Location: [facility] ENDOSCOPY;  Service: Cardiovascular;  Laterality: N/A;    OB History  No obstetric history on file.      Home Medications    Prior to Admission medications   Medication Sig Start Date End Date Taking? Authorizing Provider  aspirin EC 81 MG EC tablet Take 1 tablet (81 mg total) by mouth daily. 12/25/12  Yes Drema Dallas, MD  calcium carbonate (OSCAL) 1500 (600 Ca) MG TABS tablet Take by mouth 2 (two) times daily with a meal.   Yes [provider]  cetirizine (ZYRTEC ALLERGY) 10 MG tablet Take 1 tablet (10 mg total) by mouth daily. 05/28/23  Yes Wallis Bamberg, PA-C  Cholecalciferol (VITAMIN D3) 50 MCG (2000 UT) TABS  Take by mouth.   Yes [provider]  Cyanocobalamin (VITAMIN B-12 CR PO) Take by mouth.   Yes [provider]  gabapentin (NEURONTIN) 100 MG capsule Take 1-3 capsules at night as needed   Yes [provider]  ibuprofen (ADVIL) 600 MG tablet Take 1 tablet (600 mg total) by mouth every 6 (six) hours as needed. 05/28/23  Yes Wallis Bamberg, PA-C  labetalol (NORMODYNE) 100 MG tablet TAKE 1 TABLET BY MOUTH 2 TIMES A DAY 01/22/23  Yes Arnette Felts, FNP  Multiple Vitamin (MULTIVITAMIN ADULT PO) Take by mouth.   Yes [provider]  vitamin E 180 MG (400 UNITS) capsule Take 400 Units by mouth daily.   Yes [provider]  amoxicillin-clavulanate (AUGMENTIN) 875-125 MG tablet Take 1 tablet by mouth 2 (two) times daily. 03/21/23   [provider]  chlorthalidone (HYGROTON) 25 MG tablet Take 1 tablet (25 mg total) by mouth daily. 04/05/23   Arnette Felts, FNP  cyclobenzaprine (FLEXERIL) 5 MG tablet Take 1 tablet (5 mg total) by mouth at bedtime as needed. 05/28/23   Wallis Bamberg, PA-C  mometasone (ELOCON) 0.1 % ointment Apply 1 application topically as needed (dry skin).     [provider]  potassium chloride SA (KLOR-CON M20) 20 MEQ tablet Take 1 tablet (20 mEq total) by mouth daily. 11/20/22   Arnette Felts, FNP  promethazine-dextromethorphan (PROMETHAZINE-DM) 6.25-15 MG/5ML syrup Take 5 mLs by mouth 3 (three) times daily as needed for cough. 05/28/23   Wallis Bamberg, PA-C  pseudoephedrine (SUDAFED) 30 MG tablet Take 1 tablet (30 mg total) by mouth every 8 (eight) hours as needed for congestion. 05/28/23   Wallis Bamberg, PA-C  prochlorperazine (COMPAZINE) 10 MG tablet Take 1 tablet (10 mg total) by mouth every 6 (six) hours as needed (Nausea or vomiting). 06/04/18 09/03/18  Serena Croissant, MD    Family History Family History  Problem Relation Age of Onset   Cancer Father    Stroke Father 83   Lung cancer Father    Prostate cancer Paternal Uncle         dx 78's?   Diabetes Maternal Grandfather    Breast cancer Neg Hx     Social History Social History   Tobacco Use   Smoking status: Never   Smokeless tobacco: Never  Vaping Use   Vaping status: Never Used  Substance Use Topics   Alcohol use: No    Alcohol/week: 0.0 standard drinks of alcohol   Drug use: No     Allergies   Lisinopril   Review of Systems Review of Systems  Constitutional:  Positive for fatigue and fever.  HENT:  Positive for congestion, rhinorrhea, sinus pressure and sinus pain.   Respiratory:  Positive for cough.   Cardiovascular: Negative.   Gastrointestinal: Negative.   Musculoskeletal: Negative.   Psychiatric/Behavioral: Negative.  Physical Exam Triage Vital Signs ED Triage Vitals [07/22/23 1258]  Encounter Vitals Group     BP 115/76     Systolic BP Percentile      Diastolic BP Percentile      Pulse Rate 73     Resp 16     Temp 98.3 F (36.8 C)     Temp Source Oral     SpO2 97 %     Weight      Height      Head Circumference      Peak Flow      Pain Score      Pain Loc      Pain Education      Exclude from Growth Chart    No data found.  Updated Vital Signs BP 115/76 (BP Location: Left Arm)   Pulse 73   Temp 98.3 F (36.8 C) (Oral)   Resp 16   LMP 04/16/2011   SpO2 97%   Visual Acuity Right Eye Distance:   Left Eye Distance:   Bilateral Distance:    Right Eye Near:   Left Eye Near:    Bilateral Near:     Physical Exam Constitutional:      General: She is not in acute distress.    Appearance: Normal appearance. She is normal weight. She is not ill-appearing or toxic-appearing.  HENT:     Nose: Congestion and rhinorrhea present.     Right Sinus: Maxillary sinus tenderness and frontal sinus tenderness present.     Left Sinus: Maxillary sinus tenderness and frontal sinus tenderness present.  Cardiovascular:     Rate and Rhythm: Normal rate and regular rhythm.  Pulmonary:     Effort: Pulmonary effort is  normal.     Breath sounds: Normal breath sounds.  Musculoskeletal:     Cervical back: Normal range of motion and neck supple. No tenderness.  Neurological:     General: No focal deficit present.     Mental Status: She is alert.  Psychiatric:        Mood and Affect: Mood normal.      UC Treatments / Results  Labs (all labs ordered are listed, but only abnormal results are displayed) Labs Reviewed - No data to display  EKG   Radiology No results found.  Procedures Procedures (including critical care time)  Medications Ordered in UC Medications - No data to display  Initial Impression / Assessment and Plan / UC Course  I have reviewed the triage vital signs and the nursing notes.  Pertinent labs & imaging results that were available during my care of the patient were reviewed by me and considered in my medical decision making (see chart for details).   Final Clinical Impressions(s) / UC Diagnoses   Final diagnoses:  Acute non-recurrent maxillary sinusitis     Discharge Instructions      You were diagnosed with a sinus infection today.  I have sent out an oral antibiotic and steroid for your symptoms.  You may still take over the counter claritin or zyrtec for help.  Return if not improving.     ED Prescriptions     Medication Sig Dispense Auth. Provider   amoxicillin-clavulanate (AUGMENTIN) 875-125 MG tablet Take 1 tablet by mouth every 12 (twelve) hours for 10 days. 20 tablet Percilla Tweten, MD   predniSONE (DELTASONE) 20 MG tablet Take 2 tablets (40 mg total) by mouth daily for 5 days. 10 tablet Jannifer Franklin, MD  PDMP not reviewed this encounter.   Jannifer Franklin, MD 07/22/23 1315

## 2023-07-22 NOTE — Discharge Instructions (Signed)
 You were diagnosed with a sinus infection today.  I have sent out an oral antibiotic and steroid for your symptoms.  You may still take over the counter claritin or zyrtec for help.  Return if not improving.

## 2023-07-22 NOTE — ED Triage Notes (Signed)
 Nasal congestion, hoarse voice and cough x 2 weeks. Patient states she had the flu last week. Home Intervention: None

## 2023-07-24 ENCOUNTER — Ambulatory Visit (HOSPITAL_COMMUNITY)
Admission: RE | Admit: 2023-07-24 | Discharge: 2023-07-24 | Disposition: A | Discharge status: Home / Self Care | Source: Ambulatory Visit | Attending: Physician Assistant | Admitting: Physician Assistant

## 2023-07-24 ENCOUNTER — Encounter (HOSPITAL_COMMUNITY): Payer: Self-pay

## 2023-07-24 ENCOUNTER — Other Ambulatory Visit: Payer: Self-pay

## 2023-07-24 ENCOUNTER — Ambulatory Visit (INDEPENDENT_AMBULATORY_CARE_PROVIDER_SITE_OTHER)

## 2023-07-24 VITALS — BP 129/81 | HR 87 | Temp 98.6°F | Resp 18

## 2023-07-24 DIAGNOSIS — H109 Unspecified conjunctivitis: Secondary | ICD-10-CM | POA: Diagnosis not present

## 2023-07-24 DIAGNOSIS — R051 Acute cough: Secondary | ICD-10-CM | POA: Diagnosis not present

## 2023-07-24 DIAGNOSIS — Z973 Presence of spectacles and contact lenses: Secondary | ICD-10-CM | POA: Diagnosis not present

## 2023-07-24 DIAGNOSIS — J40 Bronchitis, not specified as acute or chronic: Secondary | ICD-10-CM

## 2023-07-24 DIAGNOSIS — J329 Chronic sinusitis, unspecified: Secondary | ICD-10-CM

## 2023-07-24 MED ORDER — AEROCHAMBER PLUS FLO-VU LARGE MISC
Status: AC
  Filled 2023-07-24: qty 1

## 2023-07-24 MED ORDER — AEROCHAMBER PLUS FLO-VU LARGE MISC
1.0000 | Freq: Once | Status: AC
  Administered 2023-07-24: 1

## 2023-07-24 MED ORDER — ALBUTEROL SULFATE HFA 108 (90 BASE) MCG/ACT IN AERS
INHALATION_SPRAY | RESPIRATORY_TRACT | Status: AC
  Filled 2023-07-24: qty 6.7

## 2023-07-24 MED ORDER — OFLOXACIN 0.3 % OP SOLN: 1.0000 [drp] | Freq: Four times a day (QID) | OPHTHALMIC | 0 refills | Status: DC

## 2023-07-24 MED ORDER — ALBUTEROL SULFATE HFA 108 (90 BASE) MCG/ACT IN AERS
2.0000 | INHALATION_SPRAY | Freq: Once | RESPIRATORY_TRACT | Status: AC
  Administered 2023-07-24: 2 via RESPIRATORY_TRACT

## 2023-07-24 NOTE — ED Triage Notes (Signed)
 Seen 07/22/23 Reports persistent cough for 2 weeks, white discharge from right eye intermittently  Yesterday had a severe coughing episode.    At Monday visit ( 07/22/2023 ).  Patient is taking prednisone and antibiotic prescribed at visit

## 2023-07-24 NOTE — Discharge Instructions (Signed)
 I am glad that you are feeling better after starting the medication.  Use your albuterol every 4-6 hours as needed.  Continue the prednisone and Augmentin that was prescribed at your last visit.  Use ofloxacin drops 4 times daily to cover for a bacterial infection of the eye.  Do not use any contact lenses for at least a week after finishing this medication.  Wash your hands before handling the medication and do not touch tip of medication bottle to the eye as this can contaminate the medicine.  If anything changes or worsens please be seen immediately.  Follow-up with your primary care.

## 2023-07-24 NOTE — ED Provider Notes (Signed)
 [facility]    CSN: [id number] Arrival date & time: 07/24/23  0944      History   Chief Complaint Chief Complaint  Patient presents with   Cough    Persistent cough 2 weeks. Also, possible eye infection as right eye get white stuff off and on. - Entered by patient   Appointment    10:00    HPI Katrina Liu is a 63 y.o. female.   Patient presents today for reevaluation of persistent cough.  She reports that approximately a week and a half ago she was seen by her primary care and tested positive for influenza A.  She continued to have symptoms and some seen by our clinic on 07/22/2023 at which point she was diagnosed with a sinus infection started on prednisone and Augmentin.  She does report symptoms have improved but she continues to have intermittent cough with associated wheezing and shortness of breath.  She denies history of asthma, COPD, smoking.  Denies any recent antibiotics or steroids in the past 90 days outside of what was prescribed at her last visit.  She has not been taking any over-the-counter medications recently.  She is concerned because she is also developed drainage from her right eye to the point that she had difficulty opening her eye this morning.  She does wear contacts but has not been using them since her symptoms began.  She has not tried any ophthalmic treatments.  She is concerned because she is scheduled to watch her 32-year-old grandchild and wants to make sure she does not have anything that can be passed to him.    Past Medical History:  Diagnosis Date   Anemia    Breast cancer (HCC) 10/02/2018   right breast lumpectomy   Family history of prostate cancer    GERD (gastroesophageal reflux disease)    h/o - not now   Headache(784.0)    Hypertension    Personal history of chemotherapy    Personal history of radiation therapy    S/P abdominal hysterectomy 04/17/2011   Stroke [facility])    no residual    Patient Active Problem List    Diagnosis Date Noted   Herpes zoster vaccination declined 03/27/2023   COVID-19 vaccination declined 03/27/2023   Influenza vaccination declined 03/27/2023   Paroxysmal atrial fibrillation (HCC) 01/08/2022   Mixed hyperlipidemia 01/08/2022   Multinodular goiter 10/26/2020   Port-A-Cath in place 07/01/2018   Genetic testing 06/24/2018   Family history of prostate cancer    Malignant neoplasm of upper-outer quadrant of right breast in female, estrogen receptor negative (HCC) 06/02/2018   Recurrent oral ulcers 06/25/2017   Insomnia 06/03/2014   Migraine headache 12/25/2012   Cerebellar stroke syndrome 12/22/2012   Hypertension 12/22/2012   S/P abdominal hysterectomy 04/17/2011    Past Surgical History:  Procedure Laterality Date   ABDOMINAL HYSTERECTOMY  04/16/2011   Procedure: HYSTERECTOMY ABDOMINAL;  Surgeon: Serita Kyle, MD;  Location: [facility];  Service: Gynecology;  Laterality: N/A;   BREAST BIOPSY Left 2013   BREAST BIOPSY Right 2020   BREAST LUMPECTOMY Right 10/02/2018   BREAST LUMPECTOMY WITH RADIOACTIVE SEED AND SENTINEL LYMPH NODE BIOPSY Right 10/02/2018   Procedure: RIGHT BREAST LUMPECTOMY WITH RADIOACTIVE SEED AND RIGHT SENTINEL LYMPH NODE MAPPING;  Surgeon: Harriette Bouillon, MD;  Location: [facility];  Service: General;  Laterality: Right;   KIDNEY STONE SURGERY     LAPAROTOMY     ectopic pregnancy   LOOP RECORDER IMPLANT N/A  12/24/2012   Procedure: LOOP RECORDER IMPLANT;  Surgeon: Duke Salvia, MD;  Location: [facility] CATH LAB;  Service: Cardiovascular;  Laterality: N/A;   LOOP RECORDER REMOVAL N/A 08/15/2016   Procedure: Loop Recorder Removal;  Surgeon: Duke Salvia, MD;  Location: [facility] INVASIVE CV LAB;  Service: Cardiovascular;  Laterality: N/A;   SHOULDER ARTHROSCOPY     TEE WITHOUT CARDIOVERSION N/A 12/24/2012   Procedure: TRANSESOPHAGEAL ECHOCARDIOGRAM (TEE);  Surgeon: Corky Crafts, MD;  Location[facility] ENDOSCOPY;  Service: Cardiovascular;   Laterality: N/A;    OB History   No obstetric history on file.      Home Medications    Prior to Admission medications   Medication Sig Start Date End Date Taking? Authorizing Provider  ofloxacin (OCUFLOX) 0.3 % ophthalmic solution Place 1 drop into the right eye 4 (four) times daily. 07/24/23  Yes Jariah Tarkowski K, PA-C  amoxicillin-clavulanate (AUGMENTIN) 875-125 MG tablet Take 1 tablet by mouth every 12 (twelve) hours for 10 days. 07/22/23 08/01/23  Jannifer Franklin, MD  aspirin EC 81 MG EC tablet Take 1 tablet (81 mg total) by mouth daily. 12/25/12   Drema Dallas, MD  calcium carbonate (OSCAL) 1500 (600 Ca) MG TABS tablet Take by mouth 2 (two) times daily with a meal.    [provider]  chlorthalidone (HYGROTON) 25 MG tablet Take 1 tablet (25 mg total) by mouth daily. 04/05/23   Arnette Felts, FNP  Cholecalciferol (VITAMIN D3) 50 MCG (2000 UT) TABS Take by mouth.    [provider]  Cyanocobalamin (VITAMIN B-12 CR PO) Take by mouth.    [provider]  gabapentin (NEURONTIN) 100 MG capsule Take 1-3 capsules at night as needed    [provider]  ibuprofen (ADVIL) 600 MG tablet Take 1 tablet (600 mg total) by mouth every 6 (six) hours as needed. 05/28/23   Wallis Bamberg, PA-C  labetalol (NORMODYNE) 100 MG tablet TAKE 1 TABLET BY MOUTH 2 TIMES A DAY 01/22/23   Arnette Felts, FNP  Multiple Vitamin (MULTIVITAMIN ADULT PO) Take by mouth.    [provider]  predniSONE (DELTASONE) 20 MG tablet Take 2 tablets (40 mg total) by mouth daily for 5 days. 07/22/23 07/27/23  Jannifer Franklin, MD  vitamin E 180 MG (400 UNITS) capsule Take 400 Units by mouth daily.    [provider]  prochlorperazine (COMPAZINE) 10 MG tablet Take 1 tablet (10 mg total) by mouth every 6 (six) hours as needed (Nausea or vomiting). 06/04/18 09/03/18  Serena Croissant, MD    Family History Family History  Problem Relation Age of Onset   Cancer Father    Stroke Father 91    Lung cancer Father    Prostate cancer Paternal Uncle        dx 1's?   Diabetes Maternal Grandfather    Breast cancer Neg Hx     Social History Social History   Tobacco Use   Smoking status: Never   Smokeless tobacco: Never  Vaping Use   Vaping status: Never Used  Substance Use Topics   Alcohol use: No    Alcohol/week: 0.0 standard drinks of alcohol   Drug use: No     Allergies   Lisinopril   Review of Systems Review of Systems  Constitutional:  Negative for activity change, appetite change, fatigue and fever.  HENT:  Positive for congestion. Negative for sinus pressure (improving), sneezing and sore throat.   Respiratory:  Positive for cough and wheezing. Negative for  shortness of breath.   Cardiovascular:  Negative for chest pain.  Gastrointestinal:  Negative for abdominal pain, diarrhea, nausea and vomiting.  Neurological:  Negative for dizziness, light-headedness and headaches.     Physical Exam Triage Vital Signs ED Triage Vitals  Encounter Vitals Group     BP 07/24/23 1013 129/81     Systolic BP Percentile --      Diastolic BP Percentile --      Pulse Rate 07/24/23 1013 87     Resp 07/24/23 1013 18     Temp 07/24/23 1013 98.6 F (37 C)     Temp Source 07/24/23 1013 Oral     SpO2 07/24/23 1013 95 %     Weight --      Height --      Head Circumference --      Peak Flow --      Pain Score 07/24/23 1008 0     Pain Loc --      Pain Education --      Exclude from Growth Chart --    No data found.  Updated Vital Signs BP 129/81 (BP Location: Left Arm)   Pulse 87   Temp 98.6 F (37 C) (Oral)   Resp 18   LMP 04/16/2011   SpO2 95%   Visual Acuity Right Eye Distance:   Left Eye Distance:   Bilateral Distance:    Right Eye Near:   Left Eye Near:    Bilateral Near:     Physical Exam Vitals reviewed.  Constitutional:      General: She is awake. She is not in acute distress.    Appearance: Normal appearance. She is well-developed. She is not  ill-appearing.     Comments: Very pleasant female appears stated age in no acute distress sitting comfortably in exam room  HENT:     Head: Normocephalic and atraumatic.     Right Ear: Tympanic membrane, ear canal and external ear normal. Tympanic membrane is not erythematous or bulging.     Left Ear: Tympanic membrane, ear canal and external ear normal. Tympanic membrane is not erythematous or bulging.     Nose: Nose normal.     Mouth/Throat:     Pharynx: Uvula midline. No oropharyngeal exudate or posterior oropharyngeal erythema.  Cardiovascular:     Rate and Rhythm: Normal rate and regular rhythm.     Heart sounds: Normal heart sounds, S1 normal and S2 normal. No murmur heard. Pulmonary:     Effort: Pulmonary effort is normal.     Breath sounds: Wheezing present. No rhonchi or rales.     Comments: Scattered wheezing Psychiatric:        Behavior: Behavior is cooperative.      UC Treatments / Results  Labs (all labs ordered are listed, but only abnormal results are displayed) Labs Reviewed - No data to display  EKG   Radiology No results found.  Procedures Procedures (including critical care time)  Medications Ordered in UC Medications  albuterol (VENTOLIN HFA) 108 (90 Base) MCG/ACT inhaler 2 puff (2 puffs Inhalation Given 07/24/23 1043)  AeroChamber Plus Flo-Vu Large MISC 1 each (1 each Other Given 07/24/23 1044)    Initial Impression / Assessment and Plan / UC Course  I have reviewed the triage vital signs and the nursing notes.  Pertinent labs & imaging results that were available during my care of the patient were reviewed by me and considered in my medical decision making (see chart for details).  Patient is well-appearing, afebrile, nontoxic, nontachycardic.  Chest x-ray was obtained given her persistent cough that showed no acute cardiopulmonary disease based on my primary read.  At the time discharge we were waiting for radiologist over read and we will  contact her if this differs and changes our treatment plan.  She was encouraged to complete course of prednisone and Augmentin that was prescribed at her last visit.  She was given albuterol inhaler with improvement of symptoms and sent home with this medication to be used every 4-6 hours as needed.  Recommend that she use over-the-counter medication for additional symptom relief.  She does report significant overnight drainage of her right eye so we will cover for bacterial conjunctivitis with ofloxacin given she uses contact lenses.  We discussed that she should not use contact lenses until eye drainage has resolved for at least 1 week.  Recommended follow-up with her ophthalmologist if her symptoms are not improving quickly.  She is to follow-up with her primary care if symptoms have not resolved within 3 to 5 days.  We discussed that if anything worsen she needs to be seen immediately including chest pain, shortness of breath, worsening cough, fever, nausea/vomiting.  Should return precautions given.  All questions answered to patient satisfaction.  Final Clinical Impressions(s) / UC Diagnoses   Final diagnoses:  Acute cough  Sinobronchitis  Bacterial conjunctivitis of right eye  Uses contact lenses     Discharge Instructions      I am glad that you are feeling better after starting the medication.  Use your albuterol every 4-6 hours as needed.  Continue the prednisone and Augmentin that was prescribed at your last visit.  Use ofloxacin drops 4 times daily to cover for a bacterial infection of the eye.  Do not use any contact lenses for at least a week after finishing this medication.  Wash your hands before handling the medication and do not touch tip of medication bottle to the eye as this can contaminate the medicine.  If anything changes or worsens please be seen immediately.  Follow-up with your primary care.     ED Prescriptions     Medication Sig Dispense Auth. Provider   ofloxacin  (OCUFLOX) 0.3 % ophthalmic solution Place 1 drop into the right eye 4 (four) times daily. 5 mL Dallis Czaja K, PA-C      PDMP not reviewed this encounter.   Jeani Hawking, PA-C 07/24/23 1123

## 2023-08-06 ENCOUNTER — Ambulatory Visit: Admitting: Nurse Practitioner

## 2023-08-06 ENCOUNTER — Encounter: Payer: Self-pay | Admitting: Nurse Practitioner

## 2023-08-06 VITALS — BP 120/80 | HR 87 | Temp 98.7°F | Ht 65.0 in | Wt 139.6 lb

## 2023-08-06 DIAGNOSIS — M545 Low back pain, unspecified: Secondary | ICD-10-CM | POA: Diagnosis not present

## 2023-08-06 DIAGNOSIS — R55 Syncope and collapse: Secondary | ICD-10-CM

## 2023-08-06 DIAGNOSIS — I10 Essential (primary) hypertension: Secondary | ICD-10-CM | POA: Diagnosis not present

## 2023-08-06 DIAGNOSIS — R109 Unspecified abdominal pain: Secondary | ICD-10-CM

## 2023-08-06 DIAGNOSIS — Z2821 Immunization not carried out because of patient refusal: Secondary | ICD-10-CM

## 2023-08-06 LAB — POCT URINALYSIS DIP (CLINITEK)
Bilirubin, UA: NEGATIVE
Blood, UA: NEGATIVE
Glucose, UA: NEGATIVE mg/dL
Ketones, POC UA: NEGATIVE mg/dL
Leukocytes, UA: NEGATIVE
Nitrite, UA: NEGATIVE
POC PROTEIN,UA: NEGATIVE
Spec Grav, UA: 1.01 (ref 1.010–1.025)
Urobilinogen, UA: 0.2 U/dL
pH, UA: 7 (ref 5.0–8.0)

## 2023-08-06 NOTE — Progress Notes (Signed)
 [place], CMA,acting as a Neurosurgeon for Katrina Epley, FNP.,have documented all relevant documentation on the behalf of Katrina Epley, FNP,as directed by  Katrina Epley, FNP while in the presence of Katrina Epley, FNP.  Subjective:  Patient ID: Katrina Liu , female    DOB: 08/14/1960 , 63 y.o.   MRN: [id number]  Chief Complaint  Patient presents with   Abdominal Pain   Back Pain    HPI  Patient presents today for back and uncomfortable feeling in her stomach that started after finishing the amoxicillin that was giving to treat the flu. She has used her TENS machine  She was seen at [facility] on 3/19 did a flu/covid and was diagnosed with influenza a - had tamiflu, zyrtec, tessalon perles, delsym. From the night before she was unable to breath out of her mouth during that time. She passed out on 3/20 when pouring her cough syrup and woke up on the floor. She did not eat much during this time but was drinking adequate amounts of water.  After she stopped all the over the counter medications. She then made an appt at Urgent care on 3/24 - she had been losing her balance. She was then given amoxicillin for a sinus infection. Also took prednisone which was helpful. Went back to urgent care on 3/29 due to having a red eye - diagnosed with bacterial conjunctivitis and treated with eye drop. She also had a chest xray which was negative.   Patient reports she had the flu March 19th and laryngitis January 28th. She reports she still has a lot of post nasal drip. She reports a episode of syncope on the March 23rd which she did not get seen about.   When she took the prednisone her fat necrosis improved. She is going to ask her Oncologist in July and ask for prednisone order on standby.      Past Medical History:  Diagnosis Date   Anemia    Breast cancer (HCC) 10/02/2018   right breast lumpectomy   Family history of prostate cancer    GERD (gastroesophageal reflux disease)    h/o - not now    Headache(784.0)    Hypertension    Personal history of chemotherapy    Personal history of radiation therapy    S/P abdominal hysterectomy 04/17/2011   Stroke (HCC)    no residual     Family History  Problem Relation Age of Onset   Cancer Father    Stroke Father 89   Lung cancer Father    Prostate cancer Paternal Uncle        dx 36's?   Diabetes Maternal Grandfather    Breast cancer Neg Hx      Current Outpatient Medications:    aspirin EC 81 MG EC tablet, Take 1 tablet (81 mg total) by mouth daily., Disp: 90 tablet, Rfl: 0   calcium carbonate (OSCAL) 1500 (600 Ca) MG TABS tablet, Take by mouth 2 (two) times daily with a meal., Disp: , Rfl:    chlorthalidone (HYGROTON) 25 MG tablet, Take 1 tablet (25 mg total) by mouth daily., Disp: 90 tablet, Rfl: 1   Cholecalciferol (VITAMIN D3) 50 MCG (2000 UT) TABS, Take by mouth., Disp: , Rfl:    Cyanocobalamin (VITAMIN B-12 CR PO), Take by mouth. , Disp: , Rfl:    gabapentin (NEURONTIN) 100 MG capsule, Take 1-3 capsules at night as needed, Disp: , Rfl:    labetalol (NORMODYNE) 100 MG tablet, TAKE 1 TABLET  BY MOUTH 2 TIMES A DAY, Disp: 180 tablet, Rfl: 1   Multiple Vitamin (MULTIVITAMIN ADULT PO), Take by mouth., Disp: , Rfl:    vitamin E 180 MG (400 UNITS) capsule, Take 400 Units by mouth daily., Disp: , Rfl:    ibuprofen (ADVIL) 600 MG tablet, Take 1 tablet (600 mg total) by mouth every 6 (six) hours as needed. (Patient not taking: Reported on 08/12/2023), Disp: 30 tablet, Rfl: 0   Allergies  Allergen Reactions   Lisinopril Swelling and Other (See Comments)     Review of Systems  Constitutional: Negative.   Eyes: Negative.   Respiratory: Negative.  Negative for shortness of breath.   Musculoskeletal: Negative.   Skin: Negative.   Neurological:  Negative for headaches.  Psychiatric/Behavioral: Negative.       Today's Vitals   08/06/23 1619  BP: 120/80  Pulse: 87  Temp: 98.7 F (37.1 C)  TempSrc: Oral  Weight: 139 lb  9.6 oz (63.3 kg)  Height: 5\' 5"  (1.651 m)  PainSc: 0-No pain   Body mass index is 23.23 kg/m.  Wt Readings from Last 3 Encounters:  08/06/23 139 lb 9.6 oz (63.3 kg)  04/09/23 140 lb (63.5 kg)  03/27/23 140 lb 12.8 oz (63.9 kg)      Objective:  Physical Exam Vitals and nursing note reviewed.  Constitutional:      General: She is not in acute distress.    Appearance: Normal appearance.  Cardiovascular:     Rate and Rhythm: Normal rate and regular rhythm.     Pulses: Normal pulses.     Heart sounds: Normal heart sounds. No murmur heard. Pulmonary:     Effort: Pulmonary effort is normal. No respiratory distress.     Breath sounds: Normal breath sounds. No wheezing.  Skin:    General: Skin is warm and dry.     Capillary Refill: Capillary refill takes less than 2 seconds.  Neurological:     General: No focal deficit present.     Mental Status: She is alert.     Cranial Nerves: No cranial nerve deficit.     Motor: No weakness.  Psychiatric:        Mood and Affect: Mood normal.        Behavior: Behavior normal.        Thought Content: Thought content normal.        Judgment: Judgment normal.         Assessment And Plan:  Acute midline low back pain without sciatica Assessment & Plan: Not as tender today but is likely related to her fall. She feels like her pain is getting better.   Orders: -     POCT URINALYSIS DIP (CLINITEK)  Stomach pain Assessment & Plan: She is having a dull sensation to her low abdomen, urinalysis is normal    Primary hypertension Assessment & Plan: Blood pressure is well controlled, continue current medications  Orders: -     Microalbumin / creatinine urine ratio  Syncope and collapse Assessment & Plan: Reports an episode of not realizing she was on the floor, she did not go to the ER but she feels like she is doing better. Advised if occurs again she needs to go to the ER   COVID-19 vaccination declined Assessment & Plan: Declines  covid 19 vaccine. Discussed risk of covid 19 and if she changes her mind about the vaccine to call the office. Education has been provided regarding the importance of this vaccine but patient  still declined. Advised may receive this vaccine at local pharmacy or Health Dept.or vaccine clinic. Aware to provide a copy of the vaccination record if obtained from local pharmacy or Health Dept.  Encouraged to take multivitamin, vitamin d, vitamin c and zinc to increase immune system. Aware can call office if would like to have vaccine here at office. Verbalized acceptance and understanding.    Herpes zoster vaccination declined Assessment & Plan: Declines shingrix, educated on disease process and is aware if he changes his mind to notify office      Return for keep same next.  Patient was given opportunity to ask questions. Patient verbalized understanding of the plan and was able to repeat key elements of the plan. All questions were answered to their satisfaction.   Inge Mangle, FNP, have reviewed all documentation for this visit. The documentation on 08/06/23 for the exam, diagnosis, procedures, and orders are all accurate and complete.   IF YOU HAVE BEEN REFERRED TO A SPECIALIST, IT MAY TAKE 1-2 WEEKS TO SCHEDULE/PROCESS THE REFERRAL. IF YOU HAVE NOT HEARD FROM US /SPECIALIST IN TWO WEEKS, PLEASE GIVE US  A CALL AT [phone].

## 2023-08-07 ENCOUNTER — Other Ambulatory Visit: Payer: Self-pay

## 2023-08-07 ENCOUNTER — Encounter (HOSPITAL_COMMUNITY): Payer: Self-pay | Admitting: Pharmacy Technician

## 2023-08-07 ENCOUNTER — Emergency Department (HOSPITAL_COMMUNITY)

## 2023-08-07 ENCOUNTER — Encounter: Payer: Self-pay | Admitting: Nurse Practitioner

## 2023-08-07 ENCOUNTER — Emergency Department (HOSPITAL_COMMUNITY): Admission: EM | Admit: 2023-08-07 | Discharge: 2023-08-07 | Disposition: A | Discharge status: Home / Self Care

## 2023-08-07 DIAGNOSIS — D649 Anemia, unspecified: Secondary | ICD-10-CM | POA: Diagnosis not present

## 2023-08-07 DIAGNOSIS — Z79899 Other long term (current) drug therapy: Secondary | ICD-10-CM | POA: Diagnosis not present

## 2023-08-07 DIAGNOSIS — R42 Dizziness and giddiness: Secondary | ICD-10-CM | POA: Diagnosis present

## 2023-08-07 DIAGNOSIS — I10 Essential (primary) hypertension: Secondary | ICD-10-CM | POA: Insufficient documentation

## 2023-08-07 DIAGNOSIS — Z7982 Long term (current) use of aspirin: Secondary | ICD-10-CM | POA: Insufficient documentation

## 2023-08-07 LAB — COMPREHENSIVE METABOLIC PANEL WITH GFR
ALT: 14 U/L (ref 0–44)
AST: 19 U/L (ref 15–41)
Albumin: 3.6 g/dL (ref 3.5–5.0)
Alkaline Phosphatase: 85 U/L (ref 38–126)
Anion gap: 9 (ref 5–15)
BUN: 9 mg/dL (ref 8–23)
CO2: 27 mmol/L (ref 22–32)
Calcium: 9.4 mg/dL (ref 8.9–10.3)
Chloride: 102 mmol/L (ref 98–111)
Creatinine, Ser: 0.77 mg/dL (ref 0.44–1.00)
GFR, Estimated: >60 mL/min (ref >60)
Glucose, Bld: 91 mg/dL (ref 70–99)
Potassium: 3 mmol/L — ABNORMAL LOW (ref 3.5–5.1)
Sodium: 138 mmol/L (ref 135–145)
Total Bilirubin: 0.8 mg/dL (ref 0.0–1.2)
Total Protein: 6.6 g/dL (ref 6.5–8.1)

## 2023-08-07 LAB — CBC WITH DIFFERENTIAL/PLATELET
Abs Immature Granulocytes: 0.01 10*3/uL (ref 0.00–0.07)
Basophils Absolute: 0 10*3/uL (ref 0.0–0.1)
Basophils Relative: 1 %
Eosinophils Absolute: 0.1 10*3/uL (ref 0.0–0.5)
Eosinophils Relative: 2 %
HCT: 33.6 % — ABNORMAL LOW (ref 36.0–46.0)
Hemoglobin: 11.6 g/dL — ABNORMAL LOW (ref 12.0–15.0)
Immature Granulocytes: 0 %
Lymphocytes Relative: 25 %
Lymphs Abs: 1.3 10*3/uL (ref 0.7–4.0)
MCH: 30.9 pg (ref 26.0–34.0)
MCHC: 34.5 g/dL (ref 30.0–36.0)
MCV: 89.6 fL (ref 80.0–100.0)
Monocytes Absolute: 0.5 10*3/uL (ref 0.1–1.0)
Monocytes Relative: 10 %
Neutro Abs: 3.1 10*3/uL (ref 1.7–7.7)
Neutrophils Relative %: 62 %
Platelets: 281 10*3/uL (ref 150–400)
RBC: 3.75 MIL/uL — ABNORMAL LOW (ref 3.87–5.11)
RDW: 13.2 % (ref 11.5–15.5)
WBC: 5 10*3/uL (ref 4.0–10.5)
nRBC: 0 % (ref 0.0–0.2)

## 2023-08-07 LAB — MICROALBUMIN / CREATININE URINE RATIO
Creatinine, Urine: 46.5 mg/dL
Microalb/Creat Ratio: <6 mg/g{creat} (ref 0–29)
Microalbumin, Urine: <3 ug/mL

## 2023-08-07 LAB — TROPONIN I (HIGH SENSITIVITY)
Troponin I (High Sensitivity): 4 ng/L (ref <18)
Troponin I (High Sensitivity): 4 ng/L (ref <18)

## 2023-08-07 NOTE — ED Notes (Addendum)
 Patient wanted to add that she had passed out and fell on 2025/03/22and has been having right side hip and back pain since then

## 2023-08-07 NOTE — ED Triage Notes (Signed)
 Pt bib ems from work where pt had approx 10 minute episode of dizziness, palpitations and blurry vision. Stroke scale negative per EMS.  116/78 72 RR18 95% [id number]

## 2023-08-07 NOTE — Discharge Instructions (Signed)
 Your workup today was reassuring.  Please follow-up with your doctor to have your blood counts checked as you are mildly anemic today.  I have placed a referral to our cardiologist so they may reach out to the next few days to help with follow-up.  Return to the ER for worsening symptoms.

## 2023-08-07 NOTE — ED Provider Triage Note (Signed)
 Emergency Medicine Provider Triage Evaluation Note  Katrina Liu , a 63 y.o. female  was evaluated in triage.  Pt complains of dizziness.  Patient reports that she was at work when she began to feel like her vision was getting dark and had an episode of dizziness and palpitations lasting for about 10 minutes.  She denies any feelings of chest pain or shortness of breath at that time.  Also denies any feelings of nausea, vomiting, or diarrhea.  Does report that she was diagnosed with influenza about 2 weeks ago and has been seen in the ER in urgent care for this and has been on 2 different antibiotics as well as a course of steroids.  Denies any recent fever, chills or bodyaches.  Does report that she has a history of hypertension and is currently on medications for this.  Review of Systems  Positive: As above Negative: As above  Physical Exam  LMP 04/16/2011  Gen:   Awake, no distress   Resp:  Normal effort  MSK:   Moves extremities without difficulty  Other:    Medical Decision Making  Medically screening exam initiated at 2:09 PM.  Appropriate orders placed.  Katrina Liu was informed that the remainder of the evaluation will be completed by another provider, this initial triage assessment does not replace that evaluation, and the importance of remaining in the ED until their evaluation is complete.     Smitty Knudsen, PA-C 08/07/23 1411

## 2023-08-07 NOTE — ED Provider Notes (Signed)
 [place] EMERGENCY DEPARTMENT AT [facility] Provider Note   CSN: [id number] Arrival date & time: 08/07/23  1351     History  Chief Complaint  Patient presents with   Dizziness    Katrina Liu is a 63 y.o. female.  63 year old female with past medical history of hypertension presenting to the emergency department today after a near syncopal episode.  The patient states that she started having palpitations and felt very lightheaded at work.  She states that this lasted for a few minutes.  She denies any associated chest pain with this.  Denies any blood in her stool or dark stools.  The patient denies a history of DVT or pulmonary embolism, recent surgeries, recent travel.  She came to the ER today for further evaluation regarding this.  She states that she did have a syncopal episode a few weeks ago when she had the flu but has been feeling better as time is gone by since then.  She denies any focal weakness, numbness, or tingling.   Dizziness      Home Medications Prior to Admission medications   Medication Sig Start Date End Date Taking? Authorizing Provider  aspirin EC 81 MG EC tablet Take 1 tablet (81 mg total) by mouth daily. 12/25/12   Drema Dallas, MD  calcium carbonate (OSCAL) 1500 (600 Ca) MG TABS tablet Take by mouth 2 (two) times daily with a meal.    [provider]  chlorthalidone (HYGROTON) 25 MG tablet Take 1 tablet (25 mg total) by mouth daily. 04/05/23   Arnette Felts, FNP  Cholecalciferol (VITAMIN D3) 50 MCG (2000 UT) TABS Take by mouth.    [provider]  Cyanocobalamin (VITAMIN B-12 CR PO) Take by mouth.    [provider]  gabapentin (NEURONTIN) 100 MG capsule Take 1-3 capsules at night as needed    [provider]  ibuprofen (ADVIL) 600 MG tablet Take 1 tablet (600 mg total) by mouth every 6 (six) hours as needed. Patient not taking: Reported on 08/06/2023 05/28/23   Wallis Bamberg, PA-C  labetalol  (NORMODYNE) 100 MG tablet TAKE 1 TABLET BY MOUTH 2 TIMES A DAY 01/22/23   Arnette Felts, FNP  Multiple Vitamin (MULTIVITAMIN ADULT PO) Take by mouth.    [provider]  vitamin E 180 MG (400 UNITS) capsule Take 400 Units by mouth daily.    [provider]  prochlorperazine (COMPAZINE) 10 MG tablet Take 1 tablet (10 mg total) by mouth every 6 (six) hours as needed (Nausea or vomiting). 06/04/18 09/03/18  Serena Croissant, MD      Allergies    Lisinopril    Review of Systems   Review of Systems  Neurological:  Positive for dizziness.  All other systems reviewed and are negative.   Physical Exam Updated Vital Signs BP 138/89 (BP Location: Left Arm)   Pulse 67   Temp 97.8 F (36.6 C) (Oral)   Resp 15   LMP 04/16/2011   SpO2 98%  Physical Exam Vitals and nursing note reviewed.   Gen: NAD Eyes: PERRL, EOMI HEENT: no oropharyngeal swelling Neck: trachea midline Resp: clear to auscultation bilaterally Card: RRR, no murmurs, rubs, or gallops Abd: nontender, nondistended Extremities: no calf tenderness, no edema Vascular: 2+ radial pulses bilaterally, 2+ DP pulses bilaterally Neuro: Cranial nerves intact, equal strength and sensation throughout bilateral upper and lower extremities with no dysmetria on finger-to-nose testing Skin: no rashes Psyc: acting appropriately   ED Results / Procedures /  Treatments   Labs (all labs ordered are listed, but only abnormal results are displayed) Labs Reviewed  CBC WITH DIFFERENTIAL/PLATELET - Abnormal; Notable for the following components:      Result Value   RBC 3.75 (*)    Hemoglobin 11.6 (*)    HCT 33.6 (*)    All other components within normal limits  COMPREHENSIVE METABOLIC PANEL WITH GFR - Abnormal; Notable for the following components:   Potassium 3.0 (*)    All other components within normal limits  TROPONIN I (HIGH SENSITIVITY)  TROPONIN I (HIGH SENSITIVITY)    EKG EKG Interpretation Date/Time:  Wednesday August 07 2023 14:24:30 EDT Ventricular Rate:  68 PR Interval:  162 QRS Duration:  70 QT Interval:  398 QTC Calculation: 423 R Axis:   63  Text Interpretation: Normal sinus rhythm Normal ECG When compared with ECG of 29-Sep-2018 08:37, PREVIOUS ECG IS PRESENT Confirmed by Beckey Downing [phone]) on 08/07/2023 10:16:55 PM  Radiology DG Chest Portable 1 View Result Date: 08/07/2023 CLINICAL DATA:  Weakness. EXAM: PORTABLE CHEST 1 VIEW COMPARISON:  Chest radiograph dated 06/13/2018. FINDINGS: No focal consolidation, pleural effusion, or pneumothorax. Cardiac silhouette is within normal limits. No acute osseous pathology. IMPRESSION: No active disease. Electronically Signed   By: Elgie Collard M.D.   On: 08/07/2023 16:55    Procedures Procedures    Medications Ordered in ED Medications - No data to display  ED Course/ Medical Decision Making/ A&P                                 Medical Decision Making 63 year old female with past medical history of hypertension and GERD presenting to the emergency department today with palpitations and lightheadedness.  The patient's neuroexam is reassuring.  Suspicion for CVA is low at this time.  The patient's labs drawn at triage are unremarkable.  Including 2 troponins.  The patient's EKG interpreted by me shows a sinus rhythm with normal axis, normal intervals, no significant ST-T changes.  The patient does have some mild anemia here but not enough to cause the symptoms.  I discussed this with the patient and she denies any blood in her stool or dark stools.  She will follow-up with her primary care provider regarding this.  Will also provide her a cardiology referral for Holter monitoring and outpatient workup.  She is discharged with return precautions.           Final Clinical Impression(s) / ED Diagnoses Final diagnoses:  Lightheadedness  Anemia, unspecified type    Rx / DC Orders ED Discharge Orders          Ordered    Ambulatory referral  to Cardiology       Comments: If you have not heard from the Cardiology office within the next 72 hours please call [phone].   08/07/23 2221              Durwin Glaze, MD 08/07/23 2222

## 2023-08-08 ENCOUNTER — Ambulatory Visit: Attending: Nurse Practitioner

## 2023-08-08 ENCOUNTER — Encounter: Payer: Self-pay | Admitting: Nurse Practitioner

## 2023-08-08 ENCOUNTER — Other Ambulatory Visit: Payer: Self-pay | Admitting: Nurse Practitioner

## 2023-08-08 DIAGNOSIS — R55 Syncope and collapse: Secondary | ICD-10-CM

## 2023-08-08 DIAGNOSIS — R42 Dizziness and giddiness: Secondary | ICD-10-CM

## 2023-08-08 NOTE — Progress Notes (Unsigned)
 EP to read.

## 2023-08-12 ENCOUNTER — Telehealth: Payer: Self-pay

## 2023-08-12 ENCOUNTER — Telehealth (INDEPENDENT_AMBULATORY_CARE_PROVIDER_SITE_OTHER): Admitting: Nurse Practitioner

## 2023-08-12 ENCOUNTER — Encounter: Payer: Self-pay | Admitting: Nurse Practitioner

## 2023-08-12 DIAGNOSIS — R42 Dizziness and giddiness: Secondary | ICD-10-CM

## 2023-08-12 DIAGNOSIS — E876 Hypokalemia: Secondary | ICD-10-CM | POA: Diagnosis not present

## 2023-08-12 DIAGNOSIS — D649 Anemia, unspecified: Secondary | ICD-10-CM

## 2023-08-12 NOTE — Progress Notes (Signed)
 Virtual Visit via Video Note  Del Favia, CMA,acting as a scribe for [name], FNP.,have documented all relevant documentation on the behalf of [name], FNP,as directed by  [name], FNP while in the presence of [name], FNP.  I connected with Katrina Liu on 08/25/23 at  3:20 PM EDT by a video enabled telemedicine application and verified that I am speaking with the correct person using two identifiers.  Patient Location: Other:  work Dispensing optician: office  I discussed the limitations, risks, security, and privacy concerns of performing an evaluation and management service by video and the availability of in person appointments. I also discussed with the patient that there may be a patient responsible charge related to this service. The patient expressed understanding and agreed to proceed.  Subjective: PCP: [name], FNP  Chief Complaint  Patient presents with   Hospitalization Follow-up   Patient presents today for a hospital follow up, patient reports compliance with medications. She did not faint but felt dizzy but view was distorted. She ate some peanut butter crackers. Felt like it was getting worse. Byt the time they came everything came down. Patient denies any chest pain, SOB, or headaches. Patient was seen in ER on 08/07/2023 for lightheadedness.   Patient reports today they are feeling better. She has had an iron supplement in the past and her Hgb was low and potassium. She is eating a banana, sweet potato. She does not eat a good amount of meat. Eats beans, cabbage. Her stomach feels fine now but her lower back still hurst a little. She has taken a probiotic. She will apply her Zio patch tonight.      ROS: Per HPI  Current Outpatient Medications:    aspirin  EC 81 MG EC tablet, Take 1 tablet (81 mg total) by mouth daily., Disp: 90 tablet, Rfl: 0   calcium  carbonate (OSCAL) 1500 (600 Ca) MG TABS tablet, Take by mouth 2 (two) times daily  with a meal., Disp: , Rfl:    chlorthalidone  (HYGROTON ) 25 MG tablet, Take 1 tablet (25 mg total) by mouth daily., Disp: 90 tablet, Rfl: 1   Cholecalciferol (VITAMIN D3) 50 MCG (2000 UT) TABS, Take by mouth., Disp: , Rfl:    Cyanocobalamin (VITAMIN B-12 CR PO), Take by mouth. , Disp: , Rfl:    gabapentin  (NEURONTIN ) 100 MG capsule, Take 1-3 capsules at night as needed, Disp: , Rfl:    labetalol  (NORMODYNE ) 100 MG tablet, TAKE 1 TABLET BY MOUTH 2 TIMES A DAY, Disp: 180 tablet, Rfl: 1   Multiple Vitamin (MULTIVITAMIN ADULT PO), Take by mouth., Disp: , Rfl:    vitamin E 180 MG (400 UNITS) capsule, Take 400 Units by mouth daily., Disp: , Rfl:    ibuprofen  (ADVIL ) 600 MG tablet, Take 1 tablet (600 mg total) by mouth every 6 (six) hours as needed. (Patient not taking: Reported on 08/12/2023), Disp: 30 tablet, Rfl: 0  Observations/Objective: There were no vitals filed for this visit. Physical Exam Vitals and nursing note reviewed.  Constitutional:      General: She is not in acute distress.    Appearance: Normal appearance.  Cardiovascular:     Rate and Rhythm: Normal rate and regular rhythm.     Pulses: Normal pulses.     Heart sounds: Normal heart sounds. No murmur heard. Pulmonary:     Effort: Pulmonary effort is normal. No respiratory distress.     Breath sounds: Normal breath sounds. No wheezing.  Skin:  General: Skin is warm and dry.     Capillary Refill: Capillary refill takes less than 2 seconds.  Neurological:     General: No focal deficit present.     Mental Status: She is alert.     Cranial Nerves: No cranial nerve deficit.     Motor: No weakness.  Psychiatric:        Mood and Affect: Mood normal.        Behavior: Behavior normal.        Thought Content: Thought content normal.        Judgment: Judgment normal.     Assessment and Plan: Lightheadedness Assessment & Plan: Seen in ER for lightheadedness.  She was found to have a low hemoglobin down to 11.6.  Will  have her to come in for a repeat hemoglobin and iron level.  She is feeling better.  I have sent a ZIO for her to wear for 14 days.  Orders: -     Iron, TIBC and Ferritin Panel; Future  Low hemoglobin -     Iron, TIBC and Ferritin Panel; Future -     CBC with Differential/Platelet; Future  Hypokalemia Assessment & Plan: Encouraged to eat foods high in potassium.  Recheck levels.  Orders: -     BMP8+eGFR; Future    Follow Up Instructions: Return for keep same next; lab visit on 08/21/2023 at 2p.   I discussed the assessment and treatment plan with the patient. The patient was provided an opportunity to ask questions, and all were answered. The patient agreed with the plan and demonstrated an understanding of the instructions.   The patient was advised to call back or seek an in-person evaluation if the symptoms worsen or if the condition fails to improve as anticipated.  The above assessment and management plan was discussed with the patient. The patient verbalized understanding of and has agreed to the management plan.   Inge Mangle, FNP, have reviewed all documentation for this visit. The documentation on 08/12/23 for the exam, diagnosis, procedures, and orders are all accurate and complete.

## 2023-08-12 NOTE — Transitions of Care (Post Inpatient/ED Visit) (Signed)
   08/12/2023  Name: Katrina Liu MRN: [id number] DOB: [birth year]  Today's TOC FU Call Status: Today's TOC FU Call Status:: Successful TOC FU Call Completed TOC FU Call Complete Date: 08/12/23 Patient's Name and Date of Birth confirmed.  Transition Care Management Follow-up Telephone Call Date of Discharge: 08/07/23 Discharge Facility: [facility]) Type of Discharge: Inpatient Admission Primary Inpatient Discharge Diagnosis:: Lightheadedness How have you been since you were released from the hospital?: Better Any questions or concerns?: No  Items Reviewed: Did you receive and understand the discharge instructions provided?: Yes Medications obtained,verified, and reconciled?: Yes (Medications Reviewed) Any new allergies since your discharge?: No Dietary orders reviewed?: No Do you have support at home?: Yes People in Home [RPT]: child(ren), dependent  Medications Reviewed Today: Medications Reviewed Today   Medications were not reviewed in this encounter     Home Care and Equipment/Supplies: Were Home Health Services Ordered?: No Any new equipment or medical supplies ordered?: No  Functional Questionnaire: Do you need assistance with bathing/showering or dressing?: No Do you need assistance with meal preparation?: No Do you need assistance with eating?: No Do you have difficulty maintaining continence: No Do you need assistance with getting out of bed/getting out of a chair/moving?: No Do you have difficulty managing or taking your medications?: No  Follow up appointments reviewed: PCP Follow-up appointment confirmed?: Yes Date of PCP follow-up appointment?: 08/12/23 Follow-up Provider: [facility] Follow-up appointment confirmed?: No Do you need transportation to your follow-up appointment?: No Do you understand care options if your condition(s) worsen?: Yes-patient verbalized understanding    SIGNATURE Kassandra Pair, CMA

## 2023-08-13 ENCOUNTER — Ambulatory Visit: Admitting: Nurse Practitioner

## 2023-08-13 DIAGNOSIS — R55 Syncope and collapse: Secondary | ICD-10-CM | POA: Insufficient documentation

## 2023-08-13 DIAGNOSIS — M545 Low back pain, unspecified: Secondary | ICD-10-CM | POA: Insufficient documentation

## 2023-08-13 DIAGNOSIS — R109 Unspecified abdominal pain: Secondary | ICD-10-CM | POA: Insufficient documentation

## 2023-08-13 HISTORY — DX: Syncope and collapse: R55

## 2023-08-13 NOTE — Assessment & Plan Note (Signed)
 She is having a dull sensation to her low abdomen, urinalysis is normal

## 2023-08-13 NOTE — Assessment & Plan Note (Signed)
 Declines shingrix, educated on disease process and is aware if he changes his mind to notify office

## 2023-08-13 NOTE — Assessment & Plan Note (Signed)
 Blood pressure is well controlled, continue current medications.

## 2023-08-13 NOTE — Assessment & Plan Note (Signed)
 Not as tender today but is likely related to her fall. She feels like her pain is getting better.

## 2023-08-13 NOTE — Assessment & Plan Note (Signed)
 Reports an episode of not realizing she was on the floor, she did not go to the ER but she feels like she is doing better. Advised if occurs again she needs to go to the ER

## 2023-08-13 NOTE — Assessment & Plan Note (Signed)

## 2023-08-16 ENCOUNTER — Other Ambulatory Visit: Payer: Self-pay | Admitting: Nurse Practitioner

## 2023-08-16 DIAGNOSIS — E042 Nontoxic multinodular goiter: Secondary | ICD-10-CM

## 2023-08-21 ENCOUNTER — Other Ambulatory Visit

## 2023-08-21 DIAGNOSIS — E876 Hypokalemia: Secondary | ICD-10-CM

## 2023-08-21 DIAGNOSIS — D649 Anemia, unspecified: Secondary | ICD-10-CM

## 2023-08-21 DIAGNOSIS — R42 Dizziness and giddiness: Secondary | ICD-10-CM

## 2023-08-22 ENCOUNTER — Encounter: Payer: Self-pay | Admitting: Nurse Practitioner

## 2023-08-22 LAB — CBC WITH DIFFERENTIAL/PLATELET
Basophils Absolute: 0 10*3/uL (ref 0.0–0.2)
Basos: 1 %
EOS (ABSOLUTE): 0.1 10*3/uL (ref 0.0–0.4)
Eos: 4 %
Hematocrit: 37.2 % (ref 34.0–46.6)
Hemoglobin: 12.6 g/dL (ref 11.1–15.9)
Immature Grans (Abs): 0 10*3/uL (ref 0.0–0.1)
Immature Granulocytes: 0 %
Lymphocytes Absolute: 1.9 10*3/uL (ref 0.7–3.1)
Lymphs: 50 %
MCH: 31.3 pg (ref 26.6–33.0)
MCHC: 33.9 g/dL (ref 31.5–35.7)
MCV: 92 fL (ref 79–97)
Monocytes Absolute: 0.5 10*3/uL (ref 0.1–0.9)
Monocytes: 14 %
Neutrophils Absolute: 1.1 10*3/uL — ABNORMAL LOW (ref 1.4–7.0)
Neutrophils: 31 %
Platelets: 284 10*3/uL (ref 150–450)
RBC: 4.03 x10E6/uL (ref 3.77–5.28)
RDW: 13.5 % (ref 11.7–15.4)
WBC: 3.7 10*3/uL (ref 3.4–10.8)

## 2023-08-22 LAB — IRON,TIBC AND FERRITIN PANEL
Ferritin: 101 ng/mL (ref 15–150)
Iron Saturation: 31 % (ref 15–55)
Iron: 103 ug/dL (ref 27–139)
Total Iron Binding Capacity: 330 ug/dL (ref 250–450)
UIBC: 227 ug/dL (ref 118–369)

## 2023-08-22 LAB — BMP8+EGFR
BUN/Creatinine Ratio: 18 (ref 12–28)
BUN: 14 mg/dL (ref 8–27)
CO2: 26 mmol/L (ref 20–29)
Calcium: 9.8 mg/dL (ref 8.7–10.3)
Chloride: 98 mmol/L (ref 96–106)
Creatinine, Ser: 0.8 mg/dL (ref 0.57–1.00)
Glucose: 71 mg/dL (ref 70–99)
Potassium: 3.8 mmol/L (ref 3.5–5.2)
Sodium: 139 mmol/L (ref 134–144)
eGFR: 83 mL/min/{1.73_m2}

## 2023-08-25 DIAGNOSIS — D649 Anemia, unspecified: Secondary | ICD-10-CM | POA: Insufficient documentation

## 2023-08-25 DIAGNOSIS — R42 Dizziness and giddiness: Secondary | ICD-10-CM | POA: Insufficient documentation

## 2023-08-25 DIAGNOSIS — E876 Hypokalemia: Secondary | ICD-10-CM | POA: Insufficient documentation

## 2023-08-25 HISTORY — DX: Dizziness and giddiness: R42

## 2023-08-25 HISTORY — DX: Anemia, unspecified: D64.9

## 2023-08-25 NOTE — Assessment & Plan Note (Signed)
 Seen in ER for lightheadedness.  She was found to have a low hemoglobin down to 11.6.  Will have her to come in for a repeat hemoglobin and iron level.  She is feeling better.  I have sent a ZIO for her to wear for 14 days.

## 2023-08-25 NOTE — Assessment & Plan Note (Signed)
 Encouraged to eat foods high in potassium.  Recheck levels.

## 2023-08-28 NOTE — Progress Notes (Signed)
 Done

## 2023-09-04 ENCOUNTER — Encounter (HOSPITAL_COMMUNITY): Payer: Self-pay

## 2023-09-05 DIAGNOSIS — R55 Syncope and collapse: Secondary | ICD-10-CM

## 2023-09-05 DIAGNOSIS — R42 Dizziness and giddiness: Secondary | ICD-10-CM

## 2023-09-10 ENCOUNTER — Other Ambulatory Visit: Payer: Managed Care, Other (non HMO)

## 2023-09-11 ENCOUNTER — Ambulatory Visit
Admission: RE | Admit: 2023-09-11 | Discharge: 2023-09-11 | Disposition: A | Discharge status: Home / Self Care | Source: Ambulatory Visit | Attending: Obstetrics and Gynecology | Admitting: Obstetrics and Gynecology

## 2023-09-11 ENCOUNTER — Other Ambulatory Visit: Payer: Self-pay | Admitting: Obstetrics and Gynecology

## 2023-09-11 ENCOUNTER — Ambulatory Visit
Admission: RE | Admit: 2023-09-11 | Discharge: 2023-09-11 | Disposition: A | Discharge status: Home / Self Care | Payer: Managed Care, Other (non HMO) | Source: Ambulatory Visit | Attending: Nurse Practitioner | Admitting: Nurse Practitioner

## 2023-09-11 DIAGNOSIS — Z853 Personal history of malignant neoplasm of breast: Secondary | ICD-10-CM

## 2023-09-11 DIAGNOSIS — Z9889 Other specified postprocedural states: Secondary | ICD-10-CM

## 2023-09-11 DIAGNOSIS — Z1231 Encounter for screening mammogram for malignant neoplasm of breast: Secondary | ICD-10-CM

## 2023-09-11 DIAGNOSIS — E2839 Other primary ovarian failure: Secondary | ICD-10-CM

## 2023-09-24 ENCOUNTER — Ambulatory Visit: Payer: Self-pay | Admitting: Nurse Practitioner

## 2023-09-30 NOTE — Progress Notes (Signed)
 Del Favia, CMA,acting as a Neurosurgeon for [name], FNP.,have documented all relevant documentation on the behalf of [name], FNP,as directed by  [name], FNP while in the presence of [name], FNP.  Subjective:  Patient ID: Katrina Liu , female    DOB: 25-Feb-1961 , 63 y.o.   MRN: [id number]  Chief Complaint  Patient presents with   Hyperlipidemia   Hypertension    Patient presents today for a bp and dm follow up, Patient reports compliance with medication. Patient denies any chest pain, SOB, or headaches. Patient has no concerns today.     HPI  Follow up for BP check, patient seen 07/2023 in ER for dizziness, referred to cardiology. She wore a Zio patch....evidence of SVT episodes.  Patient states that she had no instances of dizziness, syncope, or palpitations while wearing the device.   Referral to cardiology,  She has improved her diet by adding more water, 70 oz a day, and eating more protein such as chicken and seafood.  She still finds this to be difficult, as she does not have an appetite for meats.  She is planning to participate in a fast with her church group starting this weekend.  She will be starting back to walking 3 times a week for 60 minutes at a time this week now that the weather is better.  She has had increased seasonal allergy symptoms and would like a non-drowsy allergy medication to take.     Hypertension This is a chronic problem. The current episode started more than 1 year ago. The problem is controlled. Pertinent negatives include no blurred vision, chest pain, headaches, palpitations or shortness of breath. There are no associated agents to hypertension. Risk factors for coronary artery disease include dyslipidemia and post-menopausal state. Past treatments include beta blockers. The current treatment provides significant improvement. There are no compliance problems.  There is no history of kidney disease or heart failure.     Past Medical  History:  Diagnosis Date   Anemia    Breast cancer (HCC) 10/02/2018   right breast lumpectomy   Family history of prostate cancer    GERD (gastroesophageal reflux disease)    h/o - not now   Headache(784.0)    Hypertension    Personal history of chemotherapy    Personal history of radiation therapy    S/P abdominal hysterectomy 04/17/2011   Stroke (HCC)    no residual     Family History  Problem Relation Age of Onset   Cancer Father    Stroke Father 10   Lung cancer Father    Prostate cancer Paternal Uncle        dx 37's?   Diabetes Maternal Grandfather    Breast cancer Neg Hx      Current Outpatient Medications:    aspirin  EC 81 MG EC tablet, Take 1 tablet (81 mg total) by mouth daily., Disp: 90 tablet, Rfl: 0   calcium  carbonate (OSCAL) 1500 (600 Ca) MG TABS tablet, Take by mouth 2 (two) times daily with a meal., Disp: , Rfl:    chlorthalidone  (HYGROTON ) 25 MG tablet, Take 1 tablet (25 mg total) by mouth daily., Disp: 90 tablet, Rfl: 1   Cholecalciferol (VITAMIN D3) 50 MCG (2000 UT) TABS, Take by mouth., Disp: , Rfl:    Cyanocobalamin (VITAMIN B-12 CR PO), Take by mouth. , Disp: , Rfl:    labetalol  (NORMODYNE ) 100 MG tablet, TAKE 1 TABLET BY MOUTH 2 TIMES A DAY, Disp:  180 tablet, Rfl: 1   Multiple Vitamin (MULTIVITAMIN ADULT PO), Take by mouth., Disp: , Rfl:    vitamin E 180 MG (400 UNITS) capsule, Take 400 Units by mouth daily., Disp: , Rfl:    Allergies  Allergen Reactions   Lisinopril Swelling and Other (See Comments)     Review of Systems  Constitutional: Negative.  Negative for chills, fatigue and fever.  HENT:  Positive for congestion.   Eyes:  Positive for itching. Negative for blurred vision.  Respiratory:  Negative for cough, shortness of breath and wheezing.   Cardiovascular:  Negative for chest pain and palpitations.  Gastrointestinal:  Negative for constipation, diarrhea and nausea.  Endocrine: Negative for polydipsia, polyphagia and polyuria.   Musculoskeletal: Negative.  Negative for arthralgias and myalgias.  Skin: Negative.   Allergic/Immunologic: Positive for environmental allergies.  Neurological:  Negative for dizziness, syncope, weakness and headaches.  Psychiatric/Behavioral: Negative.       Today's Vitals   10/01/23 0853  BP: 118/70  Pulse: 75  Temp: 98.3 F (36.8 C)  TempSrc: Oral  Weight: 140 lb (63.5 kg)  Height: 5\' 5"  (1.651 m)   Body mass index is 23.3 kg/m.  Wt Readings from Last 3 Encounters:  10/01/23 140 lb (63.5 kg)  08/06/23 139 lb 9.6 oz (63.3 kg)  04/09/23 140 lb (63.5 kg)    The ASCVD Risk score (Arnett DK, et al., 2019) failed to calculate for the following reasons:   Risk score cannot be calculated because patient has a medical history suggesting prior/existing ASCVD  Objective:  Physical Exam Constitutional:      Appearance: Normal appearance. She is normal weight.  HENT:     Head: Normocephalic and atraumatic.     Nose: Nose normal.     Mouth/Throat:     Mouth: Mucous membranes are dry.  Eyes:     Pupils: Pupils are equal, round, and reactive to light.  Neck:     Vascular: No carotid bruit.  Cardiovascular:     Rate and Rhythm: Normal rate and regular rhythm.     Pulses: Normal pulses.     Heart sounds: Normal heart sounds.  Pulmonary:     Effort: Pulmonary effort is normal.     Breath sounds: Normal breath sounds.  Musculoskeletal:        General: No swelling or tenderness.  Skin:    General: Skin is warm and dry.     Capillary Refill: Capillary refill takes less than 2 seconds.  Neurological:     General: No focal deficit present.     Mental Status: She is alert and oriented to person, place, and time. Mental status is at baseline.  Psychiatric:        Mood and Affect: Mood normal.        Behavior: Behavior normal.        Thought Content: Thought content normal.        Judgment: Judgment normal.         Assessment And Plan:  Primary hypertension  Mixed  hyperlipidemia    No follow-ups on file.  Patient was given opportunity to ask questions. Patient verbalized understanding of the plan and was able to repeat key elements of the plan. All questions were answered to their satisfaction.    Inge Mangle, FNP, have reviewed all documentation for this visit. The documentation on 10/01/23 for the exam, diagnosis, procedures, and orders are all accurate and complete.   IF YOU HAVE BEEN REFERRED TO A  SPECIALIST, IT MAY TAKE 1-2 WEEKS TO SCHEDULE/PROCESS THE REFERRAL. IF YOU HAVE NOT HEARD FROM US /SPECIALIST IN TWO WEEKS, PLEASE GIVE US  A CALL AT [phone].

## 2023-10-01 ENCOUNTER — Ambulatory Visit: Admitting: Nurse Practitioner

## 2023-10-01 ENCOUNTER — Encounter: Payer: Self-pay | Admitting: Nurse Practitioner

## 2023-10-01 VITALS — BP 118/70 | HR 75 | Temp 98.3°F | Ht 65.0 in | Wt 140.0 lb

## 2023-10-01 DIAGNOSIS — I10 Essential (primary) hypertension: Secondary | ICD-10-CM

## 2023-10-01 DIAGNOSIS — M8580 Other specified disorders of bone density and structure, unspecified site: Secondary | ICD-10-CM

## 2023-10-01 DIAGNOSIS — E782 Mixed hyperlipidemia: Secondary | ICD-10-CM | POA: Diagnosis not present

## 2023-10-01 DIAGNOSIS — J302 Other seasonal allergic rhinitis: Secondary | ICD-10-CM | POA: Diagnosis not present

## 2023-10-01 MED ORDER — CHLORTHALIDONE 25 MG PO TABS: 25.0000 mg | ORAL_TABLET | Freq: Every day | ORAL | 1 refills | Status: DC

## 2023-10-01 MED ORDER — LABETALOL HCL 100 MG PO TABS: 100.0000 mg | ORAL_TABLET | Freq: Two times a day (BID) | ORAL | 1 refills | Status: DC

## 2023-10-01 MED ORDER — LORATADINE 10 MG PO TABS: 10.0000 mg | ORAL_TABLET | Freq: Every day | ORAL | 2 refills | Status: AC

## 2023-10-01 NOTE — Assessment & Plan Note (Signed)
 Loratadine 10mg  daily ordered.

## 2023-10-01 NOTE — Assessment & Plan Note (Signed)
 Recheck of levels today.  Last LDL 103, continue low fat diet

## 2023-10-01 NOTE — Patient Instructions (Signed)
 Take 3000U of Vitamin D3 and 1200mg  of Calcium  per day OTC for osteopenia.  This will help support bone health.

## 2023-10-01 NOTE — Assessment & Plan Note (Addendum)
 Zio patch results discussed with patient, referral to cardiology with appointment made.  Continue to hydrate, follow balanced diet, and increase physical activity.  No strenuous activity until cleared by cardiology.  Patient expressed understanding.  Continue medications as prescribed

## 2023-10-02 LAB — CBC
Hematocrit: 39.9 % (ref 34.0–46.6)
Hemoglobin: 13.2 g/dL (ref 11.1–15.9)
MCH: 31.4 pg (ref 26.6–33.0)
MCHC: 33.1 g/dL (ref 31.5–35.7)
MCV: 95 fL (ref 79–97)
Platelets: 206 10*3/uL (ref 150–450)
RBC: 4.21 x10E6/uL (ref 3.77–5.28)
RDW: 12.6 % (ref 11.7–15.4)
WBC: 2.7 10*3/uL — ABNORMAL LOW (ref 3.4–10.8)

## 2023-10-02 LAB — CMP14+EGFR
ALT: 15 IU/L (ref 0–32)
AST: 20 IU/L (ref 0–40)
Albumin: 4.4 g/dL (ref 3.9–4.9)
Alkaline Phosphatase: 105 IU/L (ref 44–121)
BUN/Creatinine Ratio: 13 (ref 12–28)
BUN: 12 mg/dL (ref 8–27)
Bilirubin Total: 0.6 mg/dL (ref 0.0–1.2)
CO2: 24 mmol/L (ref 20–29)
Calcium: 10 mg/dL (ref 8.7–10.3)
Chloride: 102 mmol/L (ref 96–106)
Creatinine, Ser: 0.91 mg/dL (ref 0.57–1.00)
Globulin, Total: 2.3 g/dL (ref 1.5–4.5)
Glucose: 81 mg/dL (ref 70–99)
Potassium: 3.6 mmol/L (ref 3.5–5.2)
Sodium: 142 mmol/L (ref 134–144)
Total Protein: 6.7 g/dL (ref 6.0–8.5)
eGFR: 71 mL/min/{1.73_m2} (ref >59)

## 2023-10-02 LAB — LIPID PANEL
Chol/HDL Ratio: 2.8 ratio (ref 0.0–4.4)
Cholesterol, Total: 191 mg/dL (ref 100–199)
HDL: 69 mg/dL (ref >39)
LDL Chol Calc (NIH): 108 mg/dL — ABNORMAL HIGH (ref 0–99)
Triglycerides: 79 mg/dL (ref 0–149)
VLDL Cholesterol Cal: 14 mg/dL (ref 5–40)

## 2023-10-03 ENCOUNTER — Ambulatory Visit: Payer: Self-pay | Admitting: Nurse Practitioner

## 2023-10-03 ENCOUNTER — Ambulatory Visit: Admitting: Nurse Practitioner

## 2023-10-06 ENCOUNTER — Other Ambulatory Visit: Payer: Self-pay | Admitting: Nurse Practitioner

## 2023-10-06 DIAGNOSIS — I10 Essential (primary) hypertension: Secondary | ICD-10-CM

## 2023-10-07 ENCOUNTER — Ambulatory Visit: Payer: Self-pay | Admitting: Nurse Practitioner

## 2023-10-29 ENCOUNTER — Ambulatory Visit: Admitting: Nurse Practitioner

## 2023-11-06 NOTE — Progress Notes (Signed)
 Cardiology Clinic Note   Patient Name: Katrina Liu Date of Encounter: 11/08/2023  Primary Care Provider:  Georgina Speaks, FNP Primary Cardiologist:  Katrina DELENA Merck, MD  Patient Profile    Katrina Liu 63 year old female presents the clinic today for an evaluation of her lightheadedness.  Past Medical History    Past Medical History:  Diagnosis Date   Anemia    Breast cancer (HCC) 10/02/2018   right breast lumpectomy   Family history of prostate cancer    GERD (gastroesophageal reflux disease)    h/o - not now   Headache(784.0)    Hypertension    Lightheadedness 08/25/2023   Low hemoglobin 08/25/2023   Personal history of chemotherapy    Personal history of radiation therapy    S/P abdominal hysterectomy 04/17/2011   Stroke [facility])    no residual   Syncope and collapse 08/13/2023   Past Surgical History:  Procedure Laterality Date   ABDOMINAL HYSTERECTOMY  04/16/2011   Procedure: HYSTERECTOMY ABDOMINAL;  Surgeon: Katrina DELENA Carder, MD;  Location: Kat[facility];  Service: Gynecology;  Laterality: N/A;   BREAST BIOPSY Left 2013   BREAST BIOPSY Right 2020   BREAST LUMPECTOMY Right 10/02/2018   BREAST LUMPECTOMY WITH RADIOACTIVE SEED AND SENTINEL LYMPH NODE BIOPSY Right 10/02/2018   Procedure: RIGHT BREAST LUMPECTOMY WITH RADIOACTIVE SEED AND RIGHT SENTINEL LYMPH NODE MAPPING;  Surgeon: Katrina Ned, MD;  Location: Katrina Liu;  Service: General;  Laterality: Right;   KIDNEY STONE SURGERY     LAPAROTOMY     ectopic pregnancy   LOOP RECORDER IMPLANT N/A 12/24/2012   Procedure: LOOP RECORDER IMPLANT;  Surgeon: Katrina Liu Sage, MD;  Location: [facility];  Service: Cardiovascular;  Laterality: N/A;   LOOP RECORDER REMOVAL N/A 08/15/2016   Procedure: Loop Recorder Removal;  Surgeon: Katrina Liu Sage, MD;  Location: Katrina Liu;  Service: Cardiovascular;  Laterality: N/A;   SHOULDER ARTHROSCOPY     TEE WITHOUT CARDIOVERSION N/A 12/24/2012   Procedure:  TRANSESOPHAGEAL ECHOCARDIOGRAM (TEE);  Surgeon: Katrina Liu Reek, MD;  Location: Katrina Liu;  Service: Cardiovascular;  Laterality: N/A;    Allergies  Allergies  Allergen Reactions   Lisinopril Swelling and Other (See Comments)    History of Present Illness    Katrina Liu has a PMH of cerebellar stroke, hypertension, migraine headache, paroxysmal atrial fibrillation, abdominal hysterectomy, insomnia, breast CA, hyperlipidemia, and seasonal allergies.  She was seen and evaluated in the emergency department on 08/07/2023.  She reported a near syncopal episode at home.  She noted that she started having palpitations and felt very lightheaded at work.  She stated that she had the palpitations for a few minutes.  She denied chest pain.  She denied bloody stools or dark stools.  She denied history of DVT or PE.  She had not had any recent surgeries or recent travel.  She noted that she also had a syncopal episode a prior weeks ago when she had the flu however she has been feeling better since that time.  She denied weakness, numbness, and tingling.  Her CMP showed a potassium of 3.0.  Her CBC showed a hemoglobin of 11.6 and hematocrit of 33.6.  Her high-sensitivity troponins were 4 and 4.  Her chest x-ray showed no active disease.  Her EKG showed normal sinus rhythm 68 bpm.  Her neuroexam was reassuring.  It was recommended that she be referred to cardiology for further evaluation.  She wore a cardiac  event monitor 09/05/2023.  She wore the monitor for 14 days.  She was noted to have a heart rate that ranged from 53-164 with an average heart rate of 76 bpm.  She was noted to have 3 episodes of NSVT with the longest being 13 beats.  No atrial fibrillation was noted.  She was noted to have rare SVT, rare ventricular ectopy, no sustained arrhythmias.  Her triggered episodes for correlated to sinus rhythm.  She presents to the clinic today for follow-up evaluation and states she is not had any further  episodes of dizziness.  We reviewed her emergency department visit and cardiac event monitor.  She expressed understanding.  We reviewed the option of vagal maneuvers for SVT.  I will provide instructions for this.  She continues to be physically active walking for an hour to an hour and a half several times per week in the morning.  She tolerates this well.  She denies evidence of/symptoms of palpitations.  We reviewed triggers for palpitations.  I will plan to have her follow-up in 12 months and as needed.  Today she denies chest pain, shortness of breath, lower extremity edema, fatigue, palpitations, melena, hematuria, hemoptysis, diaphoresis, weakness, presyncope, syncope, orthopnea, and PND.    Home Medications    Prior to Admission medications   Medication Sig Start Date End Date Taking? Authorizing Provider  aspirin  EC 81 MG EC tablet Take 1 tablet (81 mg total) by mouth daily. 12/25/12   Katrina Tod PARAS, MD  calcium  carbonate (OSCAL) 1500 (600 Ca) MG TABS tablet Take by mouth 2 (two) times daily with a meal.    [provider]  chlorthalidone  (HYGROTON ) 25 MG tablet TAKE 1 TABLET BY MOUTH EVERY DAY 10/10/23   Katrina Speaks, FNP  Cholecalciferol (VITAMIN D3) 50 MCG (2000 UT) TABS Take by mouth.    [provider]  Cyanocobalamin (VITAMIN B-12 CR PO) Take by mouth.    [provider]  labetalol  (NORMODYNE ) 100 MG tablet TAKE 1 TABLET BY MOUTH TWICE DAILY 10/10/23   Katrina Speaks, FNP  loratadine  (CLARITIN ) 10 MG tablet Take 1 tablet (10 mg total) by mouth daily. 10/01/23 09/30/24  Moore, Janece, FNP  Multiple Vitamin (MULTIVITAMIN ADULT PO) Take by mouth.    [provider]  vitamin E 180 MG (400 UNITS) capsule Take 400 Units by mouth daily.    [provider]  prochlorperazine  (COMPAZINE ) 10 MG tablet Take 1 tablet (10 mg total) by mouth every 6 (six) hours as needed (Nausea or vomiting). 06/04/18 09/03/18  Katrina Potts, MD    Family History     Family History  Problem Relation Age of Onset   Cancer Father    Stroke Father 33   Lung cancer Father    Prostate cancer Paternal Uncle        dx 21's?   Diabetes Maternal Grandfather    Breast cancer Neg Hx    She indicated that her mother is alive. She indicated that her father is deceased. She indicated that her maternal grandmother is deceased. She indicated that her maternal grandfather is deceased. She indicated that her paternal grandmother is deceased. She indicated that her paternal grandfather is deceased. She indicated that her paternal uncle is alive. She indicated that the status of her neg hx is unknown.  Social History    Social History   Socioeconomic History   Marital status: Divorced    Spouse name: Not on file   Number of children: 1  Years of education: 49   Highest education level: 12th grade  Occupational History   Occupation: [employer]ICE [employer]    Employer: [employer]  Tobacco Use   Smoking status: Never   Smokeless tobacco: Never  Vaping Use   Vaping status: Never Used  Substance and Sexual Activity   Alcohol use: No    Alcohol/week: 0.0 standard drinks of alcohol   Drug use: No   Sexual activity: Not on file  Other Topics Concern   Not on file  Social History Narrative   Patient is single with one child.   Patient is left handed.   Patient has hs education.   Patient drinks 1/2 cup daily.   Social Drivers of Corporate investment banker Strain: Low Risk  (09/30/2023)   Overall Financial Resource Strain (CARDIA)    Difficulty of Paying Living Expenses: Not hard at all  Food Insecurity: No Food Insecurity (09/30/2023)   Hunger Vital Sign    Worried About Running Out of Food in the Last Year: Never true    Ran Out of Food in the Last Year: Never true  Transportation Needs: No Transportation Needs (09/30/2023)   PRAPARE - Administrator, Civil Service (Medical): No    Lack of Transportation (Non-Medical): No  Physical  Activity: Sufficiently Active (09/30/2023)   Exercise Vital Sign    Days of Exercise per Week: 4 days    Minutes of Exercise per Session: 40 min  Stress: No Stress Concern Present (09/30/2023)   [employer] of Occupational Health - Occupational Stress Questionnaire    Feeling of Stress : Not at all  Social Connections: Moderately Integrated (09/30/2023)   Social Connection and Isolation Panel    Frequency of Communication with Friends and Family: More than three times a week    Frequency of Social Gatherings with Friends and Family: Once a week    Attends Religious Services: More than 4 times per year    Active Member of [employer] or Organizations: Yes    Attends Banker Meetings: More than 4 times per year    Marital Status: Divorced  Intimate Partner Violence: Unknown (08/03/2021)   Received from [facility]   HITS    Physically Hurt: Not on file    Insult or Talk Down To: Not on file    Threaten Physical Harm: Not on file    Scream or Curse: Not on file     Review of Systems    General:  No chills, fever, night sweats or weight changes.  Cardiovascular:  No chest pain, dyspnea on exertion, edema, orthopnea, palpitations, paroxysmal nocturnal dyspnea. Dermatological: No rash, lesions/masses Respiratory: No cough, dyspnea Urologic: No hematuria, dysuria Abdominal:   No nausea, vomiting, diarrhea, bright red blood per rectum, melena, or hematemesis Neurologic:  No visual changes, wkns, changes in mental status. All other systems reviewed and are otherwise negative except as noted above.  Physical Exam    VS:  BP 126/80 (BP Location: Left Arm, Patient Position: Sitting, Cuff Size: Normal)   Pulse 67   Ht 5' 5.5 (1.664 m)   Wt 141 lb (64 kg)   LMP 04/16/2011   BMI 23.11 kg/m  , BMI Body mass index is 23.11 kg/m. GEN: Well nourished, well developed, in no acute distress. HEENT: normal. Neck: Supple, no JVD, carotid bruits, or masses. Cardiac: RRR, no murmurs,  rubs, or gallops. No clubbing, cyanosis, edema.  Radials/DP/PT 2+ and equal bilaterally.  Respiratory:  Respirations  regular and unlabored, clear to auscultation bilaterally. GI: Soft, nontender, nondistended, BS + x 4. MS: no deformity or atrophy. Skin: warm and dry, no rash. Neuro:  Strength and sensation are intact. Psych: Normal affect.  Accessory Clinical Findings    Recent Labs: 10/01/2023: ALT 15; BUN 12; Creatinine, Ser 0.91; Hemoglobin 13.2; Platelets 206; Potassium 3.6; Sodium 142   Recent Lipid Panel    Component Value Date/Time   CHOL 191 10/01/2023 0954   TRIG 79 10/01/2023 0954   HDL 69 10/01/2023 0954   CHOLHDL 2.8 10/01/2023 0954   CHOLHDL 2.5 12/23/2012 0500   VLDL 12 12/23/2012 0500   LDLCALC 108 (H) 10/01/2023 0954         ECG personally reviewed by me today- EKG Interpretation Date/Time:  Friday November 08 2023 08:19:11 EDT Ventricular Rate:  67 PR Interval:  128 QRS Duration:  72 QT Interval:  392 QTC Calculation: 414 R Axis:   39  Text Interpretation: Normal sinus rhythm Normal ECG When compared with ECG of 07-Aug-2023 14:24, No significant change was found Confirmed by Emelia Hazy [phone]) on 11/08/2023 8:28:45 AM    Echocardiogram 06/10/2018 IMPRESSIONS     1. The left ventricle has hyperdynamic systolic function of >65%. The  cavity size was normal. Left ventricular diastolic Doppler parameters are  consistent with impaired relaxation.   2. The right ventricle has normal systolic function. The cavity was  normal. There is no increase in right ventricular wall thickness.   3. The mitral valve is normal in structure. There is mild thickening.   4. The tricuspid valve is normal in structure.   5. The aortic valve is normal in structure.   6. The pulmonic valve was normal in structure.   FINDINGS   Left Ventricle: The left ventricle has hyperdynamic systolic function of  >65%. The cavity size was normal. There is no increase in left ventricular   wall thickness. Left ventricular diastolic Doppler parameters are  consistent with impaired relaxation  (grade I) Normal left ventricular filling pressures  Right Ventricle: The right ventricle has normal systolic function. The  cavity was normal. There is no increase in right ventricular wall  thickness.  Left Atrium: left atrial size was normal in size  Right Atrium: right atrial size was normal in size  Interatrial Septum: No atrial level shunt detected by color flow Doppler.  Pericardium: There is no evidence of pericardial effusion.  Mitral Valve: The mitral valve is normal in structure. There is mild  thickening. Mitral valve regurgitation is not visualized by color flow  Doppler.  Tricuspid Valve: The tricuspid valve is normal in structure. Tricuspid  valve regurgitation was not visualized by color flow Doppler.  Aortic Valve: The aortic valve is normal in structure. Aortic valve  regurgitation was not visualized by color flow Doppler.  Pulmonic Valve: The pulmonic valve was normal in structure. Pulmonic valve  regurgitation is not visualized by color flow Doppler.  Venous: The inferior vena cava is normal in size with greater than 50%  respiratory variability.  Additional Findings: Normal global longitudinal strain: -18.1%. Normal  lateral S prime: 12 cm/s.       Cardiac event monitor 09/05/2023  Patch Wear Time:  13 days and 23 hours   HR 53 - 164, average 76 bpm. 3 nonsustained SVT (longest 13 beats) No atrial fibrillation detected. Rare supraventricular ectopy. Rare ventricular ectopy. No sustained arrhythmias. Symptom trigger episodes correspond to sinus rhythm     [facility] Cardiac Electrophysiology  Assessment & Plan   1.  Near syncope-EKG today shows sinus rhythm 67 bpm.  Recently seen and evaluated in the emergency department.  Liu work was unremarkable.  She wore a cardiac event monitor which showed heart rate ranging from 53-164 with an  average heartbeat of 76 bpm.  She was noted to have 3 episodes of NSVT with the longest lasting 13 beats.  She was not noted to have atrial fibrillation.  Her monitor also showed rare SVT, rare ventricular ectopy, no sustained arrhythmias Avoid triggers caffeine, chocolate, EtOH, dehydration etc. May use vagal maneuvers Patient reassured  Essential hypertension-BP today 126/80. Maintain blood pressure log Healthy low-sodium diet Continue chlorthalidone , labetalol  Follows with PCP  Paroxysmal atrial fibrillation-denies episodes of palpitations, and symptoms of atrial fibrillation.This patients CHA2DS2-VASc Score and unadjusted Ischemic Stroke Rate (% per year) is equal to 4.8 % stroke rate/year from a score of 4 [ HTN,  Female, Stroke.  Discussed stroke risk as it relates to CHA2DS2-VASc score.  She reports compliance with aspirin .  Cardiac event monitor reassuring.  Wishes to defer DOAC at this time. Continue aspirin  Follows with PCP  Disposition: Follow-up with me or Dr. Loni in 12 months.   Josefa HERO. Nicholas Ossa NP-C     11/08/2023, 8:48 AM [facility] [address] Office [phone] Fax [phone]    I spent 14 minutes examining this patient, reviewing medications, and using patient centered shared decision making involving their cardiac care.   I spent  20 minutes reviewing past medical history,  medications, and prior cardiac tests.

## 2023-11-08 ENCOUNTER — Ambulatory Visit: Attending: General Practice | Admitting: General Practice

## 2023-11-08 ENCOUNTER — Encounter: Payer: Self-pay | Admitting: General Practice

## 2023-11-08 VITALS — BP 126/80 | HR 67 | Ht 65.5 in | Wt 141.0 lb

## 2023-11-08 DIAGNOSIS — R55 Syncope and collapse: Secondary | ICD-10-CM | POA: Diagnosis not present

## 2023-11-08 DIAGNOSIS — R42 Dizziness and giddiness: Secondary | ICD-10-CM

## 2023-11-08 DIAGNOSIS — I10 Essential (primary) hypertension: Secondary | ICD-10-CM | POA: Diagnosis not present

## 2023-11-08 DIAGNOSIS — I480 Paroxysmal atrial fibrillation: Secondary | ICD-10-CM

## 2023-11-08 NOTE — Patient Instructions (Signed)
 Medication Instructions:  Your physician recommends that you continue on your current medications as directed. Please refer to the Current Medication list given to you today.  *If you need a refill on your cardiac medications before your next appointment, please call your pharmacy*  Lab Work: None ordered  If you have labs (blood work) drawn today and your tests are completely normal, you will receive your results only by: MyChart Message (if you have MyChart) OR A paper copy in the mail If you have any lab test that is abnormal or we need to change your treatment, we will call you to review the results.  Testing/Procedures: None ordered  Follow-Up: At [facility], you and your health needs are our priority.  As part of our continuing mission to provide you with exceptional heart care, our providers are all part of one team.  This team includes your primary Cardiologist (physician) and Advanced Practice Providers or APPs (Physician Assistants and Nurse Practitioners) who all work together to provide you with the care you need, when you need it.  Your next appointment:   12 month(s)  Provider:   Soyla DELENA Merck, MD or Josefa Beauvais, NP          We recommend signing up for the patient portal called MyChart.  Sign up information is provided on this After Visit Summary.  MyChart is used to connect with patients for Virtual Visits (Telemedicine).  Patients are able to view lab/test results, encounter notes, upcoming appointments, etc.  Non-urgent messages can be sent to your provider as well.   To learn more about what you can do with MyChart, go to [email or web address].   Other Instructions Avoid triggers that cause palpitations: Caffeine Alcohol Stay Hydrated Continue Physical Activity  TRY HARD COUGHING 3 TIMES IN A ROW SPLASH ICE WATER IN YOUR FACE BENDING DOWN LIKE YOU ARE CONSTIPATED  Syncope, Adult  Syncope refers to a condition in which a person  temporarily loses consciousness. Syncope may also be called fainting or passing out. It is caused by a sudden decrease in blood flow to the brain. This can happen for a variety of reasons. Most causes of syncope are not dangerous. It can be triggered by things such as needle sticks, seeing blood, pain, or intense emotion. However, syncope can also be a sign of a serious medical problem, such as a heart abnormality. Other causes can include dehydration, migraines, or taking medicines that lower blood pressure. Your health care provider may do tests to find the reason why you are having syncope. If you faint, get medical help right away. Call your local emergency services (911 in the [place] Follow these instructions at home: Pay attention to any changes in your symptoms. Take these actions to stay safe and to help relieve your symptoms: Knowing when you may be about to faint Signs that you may be about to faint include: Feeling dizzy, weak, light-headed, or like the room is spinning. Feeling nauseous. Seeing spots or seeing all white or all black in your field of vision. Having cold, clammy skin or feeling warm and sweaty. Hearing ringing in the ears (tinnitus). If you start to feel like you might faint, sit or lie down right away. If sitting, put your head down between your legs. If lying down, raise (elevate) your feet above the level of your heart. Breathe deeply and steadily. Wait until all the symptoms have passed. Have someone stay with you until you feel stable. Medicines Take over-the-counter and prescription medicines  only as told by your health care provider. If you are taking blood pressure or heart medicine, get up slowly and take several minutes to sit and then stand. This can reduce dizziness and decrease the risk of syncope. Lifestyle Do not drive, use machinery, or play sports until your health care provider says it is okay. Do not drink alcohol. Do not use any products that  contain nicotine or tobacco. These products include cigarettes, chewing tobacco, and vaping devices, such as e-cigarettes. If you need help quitting, ask your health care provider. Avoid hot tubs and saunas. General instructions Talk with your health care provider about your symptoms. You may need to have testing to understand the cause of your syncope. Drink enough fluid to keep your urine pale yellow. Avoid prolonged standing. If you must stand for a long time, do movements such as: Moving your legs. Crossing your legs. Flexing and stretching your leg muscles. Squatting. Keep all follow-up visits. This is important. Contact a health care provider if: You have episodes of near fainting. Get help right away if: You faint. You hit your head or are injured after fainting. You have any of these symptoms that may indicate trouble with your heart: Fast or irregular heartbeats (palpitations). Unusual pain in your chest, abdomen, or back. Shortness of breath. You have a seizure. You have a severe headache. You are confused. You have vision problems. You have severe weakness or trouble walking. You are bleeding from your mouth or rectum, or you have black or tarry stool. These symptoms may represent a serious problem that is an emergency. Do not wait to see if your symptoms will go away. Get medical help right away. Call your local emergency services (911 in the [place] Do not drive yourself to the hospital. Summary Syncope refers to a condition in which a person temporarily loses consciousness. Syncope may also be called fainting or passing out. It is caused by a sudden decrease in blood flow to the brain. Signs that you may be about to faint include dizziness, feeling light-headed, feeling nauseous, sudden vision changes, or cold, clammy skin. Even though most causes of syncope are not dangerous, syncope can be a sign of a serious medical problem. Get help right away if you faint. If you  start to feel like you might faint, sit or lie down right away. If sitting, put your head down between your legs. If lying down, raise (elevate) your feet above the level of your heart. This information is not intended to replace advice given to you by your health care provider. Make sure you discuss any questions you have with your health care provider. Document Revised: 08/25/2020 Document Reviewed: 08/25/2020 Elsevier Patient Education  2024 [employer].

## 2023-11-11 ENCOUNTER — Encounter: Payer: Self-pay | Admitting: Hematology and Oncology

## 2023-11-11 ENCOUNTER — Inpatient Hospital Stay

## 2023-11-11 ENCOUNTER — Inpatient Hospital Stay: Payer: Managed Care, Other (non HMO) | Attending: Hematology and Oncology | Admitting: Hematology and Oncology

## 2023-11-11 VITALS — BP 118/81 | HR 78 | Temp 98.4°F | Resp 18 | Ht 65.5 in | Wt 140.0 lb

## 2023-11-11 DIAGNOSIS — Z7982 Long term (current) use of aspirin: Secondary | ICD-10-CM | POA: Insufficient documentation

## 2023-11-11 DIAGNOSIS — C50411 Malignant neoplasm of upper-outer quadrant of right female breast: Secondary | ICD-10-CM | POA: Diagnosis not present

## 2023-11-11 DIAGNOSIS — D72819 Decreased white blood cell count, unspecified: Secondary | ICD-10-CM | POA: Diagnosis not present

## 2023-11-11 DIAGNOSIS — E041 Nontoxic single thyroid nodule: Secondary | ICD-10-CM | POA: Diagnosis not present

## 2023-11-11 DIAGNOSIS — Z1732 Human epidermal growth factor receptor 2 negative status: Secondary | ICD-10-CM | POA: Diagnosis not present

## 2023-11-11 DIAGNOSIS — Z79899 Other long term (current) drug therapy: Secondary | ICD-10-CM | POA: Insufficient documentation

## 2023-11-11 DIAGNOSIS — Z1722 Progesterone receptor negative status: Secondary | ICD-10-CM | POA: Diagnosis not present

## 2023-11-11 DIAGNOSIS — M8580 Other specified disorders of bone density and structure, unspecified site: Secondary | ICD-10-CM | POA: Diagnosis not present

## 2023-11-11 DIAGNOSIS — D709 Neutropenia, unspecified: Secondary | ICD-10-CM

## 2023-11-11 DIAGNOSIS — Z171 Estrogen receptor negative status [ER-]: Secondary | ICD-10-CM | POA: Insufficient documentation

## 2023-11-11 LAB — FOLATE: Folate: 20.7 ng/mL (ref >5.9)

## 2023-11-11 LAB — CBC WITH DIFFERENTIAL (CANCER CENTER ONLY)
Abs Immature Granulocytes: 0 K/uL (ref 0.00–0.07)
Basophils Absolute: 0 K/uL (ref 0.0–0.1)
Basophils Relative: 1 %
Eosinophils Absolute: 0.1 K/uL (ref 0.0–0.5)
Eosinophils Relative: 3 %
HCT: 40.8 % (ref 36.0–46.0)
Hemoglobin: 14.1 g/dL (ref 12.0–15.0)
Immature Granulocytes: 0 %
Lymphocytes Relative: 51 %
Lymphs Abs: 1.8 K/uL (ref 0.7–4.0)
MCH: 30.7 pg (ref 26.0–34.0)
MCHC: 34.6 g/dL (ref 30.0–36.0)
MCV: 88.7 fL (ref 80.0–100.0)
Monocytes Absolute: 0.3 K/uL (ref 0.1–1.0)
Monocytes Relative: 9 %
Neutro Abs: 1.2 K/uL — ABNORMAL LOW (ref 1.7–7.7)
Neutrophils Relative %: 36 %
Platelet Count: 229 K/uL (ref 150–400)
RBC: 4.6 MIL/uL (ref 3.87–5.11)
RDW: 11.9 % (ref 11.5–15.5)
WBC Count: 3.4 K/uL — ABNORMAL LOW (ref 4.0–10.5)
nRBC: 0 % (ref 0.0–0.2)

## 2023-11-11 LAB — VITAMIN B12: Vitamin B-12: 1833 pg/mL — ABNORMAL HIGH (ref 180–914)

## 2023-11-11 NOTE — Assessment & Plan Note (Signed)
 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 Neoadjuvant chemotherapy with Adriamycin  and Cytoxan  dose dense 4 followed by Taxol  weekly 2 and carboplatin  every 3 weeks ( Discontinued due to toxicities) 2. 10/02/2018:Right Lumpectomy ([facility]): no residual carcinoma s/p neoadjuvant treatment, 0/3 lymph nodes negative for carcinoma. (Path CR) 3. Followed by adjuvant radiation therapy completed 12/22/2018 Genetics negative ---------------------------------------------------------------------------------------------------------------------------------- Breast Cancer Surveillance:  1. Mammograms and ultrasound 09/11/2023: Postlumpectomy changes: Benign (December 2021: Biopsy of the right breast nodule: Fat necrosis) breast density category B 2. Breast Exam: 11/11/2023: Benign Bone density 09/12/2023: T-score -2: Osteopenia Ultrasound thyroid  08/08/2022: Nodule measuring 2 cm right thyroid  lobe: 11/26/2022: FNA: Benign follicular nodule  Mild peripheral neuropathy: Not significant    Survivorship: She is walking 10,000 steps per day and staying active.   Tenderness in the right breast surgical scar: Severe tenderness at that spot which does not appear to be getting better.  She is applying CBD oil which appears to be helping it slightly.   RTC in 1 year

## 2023-11-11 NOTE — Progress Notes (Signed)
 Patient Care Team: Georgina Speaks, FNP as PCP - General (General Practice) Loni Soyla LABOR, MD as PCP - Cardiology (Cardiology) Odean Potts, MD as Consulting Physician (Hematology and Oncology) Dewey Rush, MD as Consulting Physician (Radiation Oncology) Vanderbilt Ned, MD as Consulting Physician (General Surgery) Emelia Josefa HERO, NP as Nurse Practitioner (Cardiology)  DIAGNOSIS:  Encounter Diagnosis  Name Primary?   Malignant neoplasm of upper-outer quadrant of right breast in female, estrogen receptor negative (HCC) Yes    SUMMARY OF ONCOLOGIC HISTORY: Oncology History  Malignant neoplasm of upper-outer quadrant of right breast in female, estrogen receptor negative (HCC)  05/29/2018 Initial Diagnosis   Right axillary pain which led to a mammogram and ultrasound that revealed a new mass in the superior UOQ tail of the breast 10 o'clock position by ultrasound measured 8 mm, axilla negative, biopsy revealed grade 3 IDC triple negative with a Ki-67 of 20%, T1BN0 stage Ib clinical stage   06/04/2018 Cancer Staging   Staging form: Breast, AJCC 8th Edition - Clinical stage from 06/04/2018: Stage IB (cT1b, cN0, cM0, G3, ER-, PR-, HER2-) - Signed by Odean Potts, MD on 06/04/2018   06/17/2018 -  Neo-Adjuvant Chemotherapy   Dose dense Adriamycin  and Cytoxan  x4 followed by Taxol  and carboplatin    07/15/2018 Genetic Testing   No pathogenic variants identified (negative testing) on the [facility] CancerNext+RNAinsight panel. The CancerNext gene panel offered by [employer] includes sequencing and rearrangement analysis for the following 34 genes:   APC, ATM, BARD1, BMPR1A, BRCA1, BRCA2, BRIP1, CDH1, CDK4, CDKN2A, CHEK2, DICER1, EPCAM, GREM1, HOXB13MLH1, MRE11A, MSH2, MSH6, MUTYH, NBN, NF1, PALB2, PMS2, POLD1, POLE, PTEN, RAD50, RAD51D, RAD51C, SMAD4, SMARCA4, STK11, and TP53. The report date is 07/15/2018.   10/02/2018 Surgery   Right Lumpectomy ([facility]): no residual carcinoma s/p neoadjuvant  treatment, 0/3 lymph nodes negative for carcinoma.    11/06/2018 - 12/22/2018 Radiation Therapy   The patient initially received a dose of 50.4 Gy in 28 fractions to the breast using whole-breast tangent fields. This was delivered using a 3-D conformal technique. The patient then received a boost to the seroma. This delivered an additional 10 Gy in 5 fractions using a electron boost with electrons. The total dose was 60.4 Gy.     CHIEF COMPLIANT: Follow-up to discuss low white blood cell count issue  HISTORY OF PRESENT ILLNESS:  History of Present Illness Katrina Liu is a 63 year old female who presents for evaluation of her white blood cell levels.  She experiences fluctuations in her white blood cell count, particularly neutrophil levels. Blood work in April showed a count of 3.7 with low neutrophils, but levels were normal a few weeks prior. The most recent test on June 3rd showed an increase, but the differential was not completed. She has a history of chemotherapy.  Within the last two months, she had the flu and was treated with antibiotics. During this time, she experienced significant nasal congestion and difficulty breathing at night, leading to fainting after taking multiple medications. Prednisone  alleviated her symptoms and reduced breast pain associated with fat necrosis.  She has osteopenia with a T score of minus two and takes calcium , vitamin D  supplements, and D3. She engages in weight-bearing exercises. She has a thyroid  nodule that was recently biopsied and is seeking a new endocrinologist. She is diligent with breast exams and recently had a mammogram followed by an ultrasound due to a suspicious finding, which was resolved after further imaging.     ALLERGIES:  is  allergic to lisinopril.  MEDICATIONS:  Current Outpatient Medications  Medication Sig Dispense Refill   aspirin  EC 81 MG EC tablet Take 1 tablet (81 mg total) by mouth daily. 90 tablet 0   calcium   carbonate (OSCAL) 1500 (600 Ca) MG TABS tablet Take by mouth 2 (two) times daily with a meal.     chlorthalidone  (HYGROTON ) 25 MG tablet TAKE 1 TABLET BY MOUTH EVERY DAY 90 tablet 1   Cholecalciferol (VITAMIN D3) 50 MCG (2000 UT) TABS Take by mouth.     Cyanocobalamin (VITAMIN B-12 CR PO) Take by mouth.     labetalol  (NORMODYNE ) 100 MG tablet TAKE 1 TABLET BY MOUTH TWICE DAILY 180 tablet 1   loratadine  (CLARITIN ) 10 MG tablet Take 1 tablet (10 mg total) by mouth daily. 30 tablet 2   Multiple Vitamin (MULTIVITAMIN ADULT PO) Take by mouth.     vitamin E 180 MG (400 UNITS) capsule Take 400 Units by mouth daily.     No current facility-administered medications for this visit.    PHYSICAL EXAMINATION: ECOG PERFORMANCE STATUS: 1 - Symptomatic but completely ambulatory  Vitals:   11/11/23 0910  BP: 118/81  Pulse: 78  Resp: 18  Temp: 98.4 F (36.9 C)  SpO2: 100%   Filed Weights   11/11/23 0910  Weight: 140 lb (63.5 kg)    Physical Exam MEASUREMENTS: Height- 5'5.5.  (exam performed in the presence of a chaperone)  LABORATORY DATA:  I have reviewed the data as listed    Latest Ref Rng & Units 10/01/2023    9:54 AM 08/21/2023    2:17 PM 08/07/2023    2:05 PM  CMP  Glucose 70 - 99 mg/dL 81  71  91   BUN 8 - 27 mg/dL 12  14  9    Creatinine 0.57 - 1.00 mg/dL 9.08  9.19  9.22   Sodium 134 - 144 mmol/L 142  139  138   Potassium 3.5 - 5.2 mmol/L 3.6  3.8  3.0   Chloride 96 - 106 mmol/L 102  98  102   CO2 20 - 29 mmol/L 24  26  27    Calcium  8.7 - 10.3 mg/dL 89.9  9.8  9.4   Total Protein 6.0 - 8.5 g/dL 6.7   6.6   Total Bilirubin 0.0 - 1.2 mg/dL 0.6   0.8   Alkaline Phos 44 - 121 IU/L 105   85   AST 0 - 40 IU/L 20   19   ALT 0 - 32 IU/L 15   14     Lab Results  Component Value Date   WBC 2.7 (L) 10/01/2023   HGB 13.2 10/01/2023   HCT 39.9 10/01/2023   MCV 95 10/01/2023   PLT 206 10/01/2023   NEUTROABS 1.1 (L) 08/21/2023    ASSESSMENT & PLAN:  Malignant neoplasm of  upper-outer quadrant of right breast in female, estrogen receptor negative (HCC) 05/29/2018:Right axillary pain which led to a mammogram and ultrasound that revealed a new mass in the superior UOQ tail of the breast 10 o'clock position by ultrasound measured 8 mm, axilla negative, biopsy revealed 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 Neoadjuvant chemotherapy with Adriamycin  and Cytoxan  dose dense 4 followed by Taxol  weekly 2 and carboplatin  every 3 weeks ( Discontinued due to toxicities) 2. 10/02/2018:Right Lumpectomy ([facility]): no residual carcinoma s/p neoadjuvant treatment, 0/3 lymph nodes negative for carcinoma. (Path CR) 3.  Followed by adjuvant radiation therapy completed 12/22/2018 Genetics negative ---------------------------------------------------------------------------------------------------------------------------------- Breast Cancer Surveillance:  1. Mammograms and ultrasound 09/11/2023: Postlumpectomy changes: Benign (December 2021: Biopsy of the right breast nodule: Fat necrosis) breast density category B 2. Breast Exam: 11/11/2023: Benign Bone density 09/12/2023: T-score -2: Osteopenia Ultrasound thyroid  08/08/2022: Nodule measuring 2 cm right thyroid  lobe: 11/26/2022: FNA: Benign follicular nodule  Mild peripheral neuropathy: Not significant    Survivorship: She is walking 10,000 steps per day and staying active.   Tenderness in the right breast surgical scar: Resolved with steroids.  If her symptoms get worse we can treated with steroids again.  Assessment & Plan Leukopenia Fluctuating WBC count likely due to recent infection, antibiotics, and ethnicity-related baseline. Past chemotherapy may contribute to lower baseline without increased infection risk. Bone marrow disorders unlikely. - Perform CBC with differential, B12, and folate level check. - No immediate intervention required; watchful monitoring  advised.  Osteopenia Osteopenia with T score of -2.0 managed with calcium  and vitamin D . No need for bisphosphonates currently. - Continue calcium  and vitamin D  supplementation. - Engage in weight-bearing exercises. - Repeat bone density scan every couple of years to monitor progression.  Thyroid  nodule Thyroid  nodule biopsied. Seeking new endocrinologist for further management. - Provide names of potential endocrinologists for thyroid  management. - Arrange follow-up with new endocrinologist for ongoing care.   Follow-up with Morna in 1 year for long-term survivorship visit.  After that appointment she could be seen on an as-needed basis.  She will get CBC with differential to follow-up on the leukopenia/neutropenia at that visit.   No orders of the defined types were placed in this encounter.  The patient has a good understanding of the overall plan. she agrees with it. she will call with any problems that may develop before the next visit here. Total time spent: 30 mins including face to face time and time spent for planning, charting and co-ordination of care   Viinay K Clearence Vitug, MD 11/11/23

## 2023-11-18 ENCOUNTER — Other Ambulatory Visit

## 2023-12-05 ENCOUNTER — Ambulatory Visit: Admitting: Cardiology

## 2024-01-03 ENCOUNTER — Other Ambulatory Visit: Payer: Self-pay | Admitting: Nurse Practitioner

## 2024-01-03 DIAGNOSIS — I10 Essential (primary) hypertension: Secondary | ICD-10-CM

## 2024-01-24 ENCOUNTER — Encounter: Payer: Self-pay | Admitting: Nurse Practitioner

## 2024-01-29 ENCOUNTER — Telehealth: Payer: Self-pay | Admitting: Nurse Practitioner

## 2024-01-29 VITALS — BP 113/80 | HR 84

## 2024-01-29 DIAGNOSIS — E042 Nontoxic multinodular goiter: Secondary | ICD-10-CM | POA: Diagnosis not present

## 2024-01-29 DIAGNOSIS — I10 Essential (primary) hypertension: Secondary | ICD-10-CM | POA: Diagnosis not present

## 2024-01-29 DIAGNOSIS — E782 Mixed hyperlipidemia: Secondary | ICD-10-CM | POA: Diagnosis not present

## 2024-01-29 NOTE — Assessment & Plan Note (Signed)
 Will refer to Dr. Faythe at patient request. She is scheduled to have an imaging study to evaluate the thyroid  this week.

## 2024-01-29 NOTE — Assessment & Plan Note (Signed)
 Cholesterol levels were stable, continue low fat diet. She has not been walking regularly.

## 2024-01-29 NOTE — Progress Notes (Signed)
 Virtual Visit via Video Note  Katrina Liu, CMA,acting as a scribe for Gaines Ada, FNP.,have documented all relevant documentation on the behalf of Gaines Ada, FNP,as directed by  Gaines Ada, FNP while in the presence of Gaines Ada, FNP.  I connected with Katrina Liu on 01/29/24 at  2:40 PM EDT by a video enabled telemedicine application and verified that I am speaking with the correct person using two identifiers.  Patient Location: Home Provider Location: Office/Clinic  I discussed the limitations, risks, security, and privacy concerns of performing an evaluation and management service by video and the availability of in person appointments. I also discussed with the patient that there may be a patient responsible charge related to this service. The patient expressed understanding and agreed to proceed.  Subjective: PCP: Ada Gaines, FNP  Chief Complaint  Patient presents with   Multinodular goiter    Patient presents today for a multinodular goiter, bp, chol follow up, Patient reports compliance with medication. Patient denies any chest pain, SOB, or headaches. Patient would like a referral to a endo doctor.    She was being seen at [facility] - Dr. Tommas and Harlene Bill FNP.  She is requesting a referral to Dr. Faythe Ee Endocrin[facility] for her treatment of her thyroid  nodule. She is due to have an updated ultrasound this week.   She also shared her Oncologist indicated her low WBC is likely related to her previous chemo treatment. She had an elevated vitamin B12 and was advised to stop taking the supplement.   Hypertension This is a chronic problem. The current episode started more than 1 year ago. The problem is unchanged. The problem is controlled. Pertinent negatives include no anxiety, headaches or shortness of breath. Past treatments include diuretics. There are no compliance problems.  There is no history of angina. There is no history of chronic  renal disease.     ROS: Per HPI  Current Outpatient Medications:    aspirin  EC 81 MG EC tablet, Take 1 tablet (81 mg total) by mouth daily., Disp: 90 tablet, Rfl: 0   calcium  carbonate (OSCAL) 1500 (600 Ca) MG TABS tablet, Take by mouth 2 (two) times daily with a meal., Disp: , Rfl:    chlorthalidone  (HYGROTON ) 25 MG tablet, TAKE 1 TABLET(25 MG) BY MOUTH DAILY, Disp: 90 tablet, Rfl: 1   Cholecalciferol (VITAMIN D3) 50 MCG (2000 UT) TABS, Take by mouth., Disp: , Rfl:    Cyanocobalamin  (VITAMIN B-12 CR PO), Take by mouth. , Disp: , Rfl:    labetalol  (NORMODYNE ) 100 MG tablet, TAKE 1 TABLET BY MOUTH TWICE DAILY, Disp: 180 tablet, Rfl: 1   loratadine  (CLARITIN ) 10 MG tablet, Take 1 tablet (10 mg total) by mouth daily., Disp: 30 tablet, Rfl: 2   Multiple Vitamin (MULTIVITAMIN ADULT PO), Take by mouth., Disp: , Rfl:    vitamin E 180 MG (400 UNITS) capsule, Take 400 Units by mouth daily., Disp: , Rfl:   Observations/Objective: Today's Vitals   01/29/24 1237  BP: 113/80  Pulse: 84   Physical Exam Vitals reviewed.  Constitutional:      General: She is not in acute distress.    Appearance: Normal appearance.  Cardiovascular:     Pulses: Normal pulses.     Heart sounds: No murmur heard. Pulmonary:     Effort: Pulmonary effort is normal. No respiratory distress.  Neurological:     General: No focal deficit present.     Mental Status: She is alert  and oriented to person, place, and time.     Cranial Nerves: No cranial nerve deficit.     Motor: No weakness.  Psychiatric:        Mood and Affect: Mood normal.        Behavior: Behavior normal.        Thought Content: Thought content normal.        Judgment: Judgment normal.     Assessment and Plan: Multinodular goiter Assessment & Plan: Will refer to Dr. Faythe at patient request. She is scheduled to have an imaging study to evaluate the thyroid  this week.   Orders: -     Ambulatory referral to Endocrinology  Primary  hypertension Assessment & Plan: Blood pressure is well controlled, continue current medications  Orders: -     BMP8+eGFR; Future  Mixed hyperlipidemia Assessment & Plan: Cholesterol levels were stable, continue low fat diet. She has not been walking regularly.   Orders: -     Lipid panel; Future   Discussed when doing activities her heart rate is expected to increase however if this continues to worsen or occurs while resting to call Cardiology to see if she needs a f/u. She has worn a Zio patch with no significant findings.   Follow Up Instructions: Return for 6 month bp check; Fasting lab visit Oct 3rd at 830.   I discussed the assessment and treatment plan with the patient. The patient was provided an opportunity to ask questions, and all were answered. The patient agreed with the plan and demonstrated an understanding of the instructions.   The patient was advised to call back or seek an in-person evaluation if the symptoms worsen or if the condition fails to improve as anticipated.  The above assessment and management plan was discussed with the patient. The patient verbalized understanding of and has agreed to the management plan.   Katrina Gaines Ada, FNP, have reviewed all documentation for this visit. The documentation on 01/29/24 for the exam, diagnosis, procedures, and orders are all accurate and complete.

## 2024-01-29 NOTE — Assessment & Plan Note (Signed)
 Blood pressure is well controlled, continue current medications.

## 2024-01-31 ENCOUNTER — Other Ambulatory Visit

## 2024-01-31 DIAGNOSIS — E782 Mixed hyperlipidemia: Secondary | ICD-10-CM

## 2024-01-31 DIAGNOSIS — I10 Essential (primary) hypertension: Secondary | ICD-10-CM

## 2024-02-01 LAB — BMP8+EGFR
BUN/Creatinine Ratio: 19 (ref 12–28)
BUN: 18 mg/dL (ref 8–27)
CO2: 27 mmol/L (ref 20–29)
Calcium: 10.1 mg/dL (ref 8.7–10.3)
Chloride: 100 mmol/L (ref 96–106)
Creatinine, Ser: 0.93 mg/dL (ref 0.57–1.00)
Glucose: 77 mg/dL (ref 70–99)
Potassium: 3.3 mmol/L — ABNORMAL LOW (ref 3.5–5.2)
Sodium: 142 mmol/L (ref 134–144)
eGFR: 69 mL/min/1.73 (ref >59)

## 2024-02-01 LAB — LIPID PANEL
Chol/HDL Ratio: 2.7 ratio (ref 0.0–4.4)
Cholesterol, Total: 186 mg/dL (ref 100–199)
HDL: 69 mg/dL (ref >39)
LDL Chol Calc (NIH): 104 mg/dL — ABNORMAL HIGH (ref 0–99)
Triglycerides: 68 mg/dL (ref 0–149)
VLDL Cholesterol Cal: 13 mg/dL (ref 5–40)

## 2024-02-03 ENCOUNTER — Ambulatory Visit
Admission: RE | Admit: 2024-02-03 | Discharge: 2024-02-03 | Disposition: A | Discharge status: Home / Self Care | Source: Ambulatory Visit | Attending: Nurse Practitioner | Admitting: Nurse Practitioner

## 2024-02-03 DIAGNOSIS — E042 Nontoxic multinodular goiter: Secondary | ICD-10-CM

## 2024-02-06 ENCOUNTER — Ambulatory Visit: Admitting: Nurse Practitioner

## 2024-04-16 ENCOUNTER — Telehealth: Admitting: Physician Assistant

## 2024-04-16 DIAGNOSIS — H1033 Unspecified acute conjunctivitis, bilateral: Secondary | ICD-10-CM

## 2024-04-16 MED ORDER — OFLOXACIN 0.3 % OP SOLN: 1.0000 [drp] | Freq: Four times a day (QID) | OPHTHALMIC | 0 refills | Status: AC

## 2024-04-16 NOTE — Patient Instructions (Signed)
 Katrina Liu, thank you for joining [name], PA-C for today's virtual visit.  While this provider is not your primary care provider (PCP), if your PCP is located in our provider database this encounter information will be shared with them immediately following your visit.   A [facility] MyChart account gives you access to today's visit and all your visits, tests, and labs performed at [facility]  click here if you don't have a [facility] MyChart account or go to [email or web address]  Consent: (Patient) Katrina Liu provided verbal consent for this virtual visit at the beginning of the encounter.  Current Medications:  Current Outpatient Medications:    aspirin  EC 81 MG EC tablet, Take 1 tablet (81 mg total) by mouth daily., Disp: 90 tablet, Rfl: 0   calcium  carbonate (OSCAL) 1500 (600 Ca) MG TABS tablet, Take by mouth 2 (two) times daily with a meal., Disp: , Rfl:    chlorthalidone  (HYGROTON ) 25 MG tablet, TAKE 1 TABLET(25 MG) BY MOUTH DAILY, Disp: 90 tablet, Rfl: 1   Cholecalciferol (VITAMIN D3) 50 MCG (2000 UT) TABS, Take by mouth., Disp: , Rfl:    Cyanocobalamin  (VITAMIN B-12 CR PO), Take by mouth. , Disp: , Rfl:    labetalol  (NORMODYNE ) 100 MG tablet, TAKE 1 TABLET BY MOUTH TWICE DAILY, Disp: 180 tablet, Rfl: 1   loratadine  (CLARITIN ) 10 MG tablet, Take 1 tablet (10 mg total) by mouth daily., Disp: 30 tablet, Rfl: 2   Multiple Vitamin (MULTIVITAMIN ADULT PO), Take by mouth., Disp: , Rfl:    vitamin E 180 MG (400 UNITS) capsule, Take 400 Units by mouth daily., Disp: , Rfl:    Medications ordered in this encounter:  No orders of the defined types were placed in this encounter.    *If you need refills on other medications prior to your next appointment, please contact your pharmacy*  Follow-Up: Call back or seek an in-person evaluation if the symptoms worsen or if the condition fails to improve as anticipated.  [facility] [phone]  Other Instructions Bacterial Conjunctivitis, Adult Bacterial conjunctivitis is an infection of your conjunctiva. This is the clear membrane that covers the white part of your eye and the inner part of your eyelid. This infection can make your eye: Red or pink. Itchy or irritated. This condition spreads easily from person to person (is contagious) and from one eye to the other eye. What are the causes? This condition is caused by germs (bacteria). You may get the infection if you come into close contact with: A person who has the infection. Items that have germs on them (are contaminated), such as face towels, contact lens solution, or eye makeup. What increases the risk? You are more likely to get this condition if: You have contact with people who have the infection. You wear contact lenses. You have a sinus infection. You have had a recent eye injury or surgery. You have a weak body defense system (immune system). You have dry eyes. What are the signs or symptoms?  Thick, yellowish discharge from the eye. Tearing or watery eyes. Itchy eyes. Burning feeling in your eyes. Eye redness. Swollen eyelids. Blurred vision. How is this treated?  Antibiotic eye drops or ointment. Antibiotic medicine taken by mouth. This is used for infections that do not get better with drops or ointment or that last more than 10 days. Cool, wet cloths placed on the eyes. Artificial tears used 2-6 times a day. Follow these instructions  at home: Medicines Take or apply your antibiotic medicine as told by your doctor. Do not stop using it even if you start to feel better. Take or apply over-the-counter and prescription medicines only as told by your doctor. Do not touch your eyelid with the eye-drop bottle or the ointment tube. Managing discomfort Wipe any fluid from your eye with a warm, wet washcloth or a cotton ball. Place a clean, cool, wet cloth on your eye. Do this for 10-20 minutes,  3-4 times a day. General instructions Do not wear contacts until the infection is gone. Wear glasses until your doctor says it is okay to wear contacts again. Do not wear eye makeup until the infection is gone. Throw away old eye makeup. Change or wash your pillowcase every day. Do not share towels or washcloths. Wash your hands often with soap and water for at least 20 seconds and especially before touching your face or eyes. Use paper towels to dry your hands. Do not touch or rub your eyes. Do not drive or use heavy machinery if your vision is blurred. Contact a doctor if: You have a fever. You do not get better after 10 days. Get help right away if: You have a fever and your symptoms get worse all of a sudden. You have very bad pain when you move your eye. Your face: Hurts. Is red. Is swollen. You have sudden loss of vision. Summary Bacterial conjunctivitis is an infection of your conjunctiva. This infection spreads easily from person to person. Wash your hands often with soap and water for at least 20 seconds and especially before touching your face or eyes. Use paper towels to dry your hands. Take or apply your antibiotic medicine as told by your doctor. Contact a doctor if you have a fever or you do not get better after 10 days. This information is not intended to replace advice given to you by your health care provider. Make sure you discuss any questions you have with your health care provider. Document Revised: 07/27/2020 Document Reviewed: 07/27/2020 Elsevier Patient Education  2024 [employer]   If you have been instructed to have an in-person evaluation today at a local Urgent Care facility, please use the link below. It will take you to a list of all of our available [facility], including address, phone number and hours of operation. Please do not delay care.  [facility]  If you or a family member do not have a primary care provider, use  the link below to schedule a visit and establish care. When you choose a [facility] primary care physician or advanced practice provider, you gain a long-term partner in health. Find a Primary Care Provider  Learn more about [facility]'s in-office and virtual care options: [facility] - Get Care Now

## 2024-04-16 NOTE — Progress Notes (Signed)
 Virtual Visit Consent   Katrina Liu, you are scheduled for a virtual visit with a [facility] provider today. Just as with appointments in the office, your consent must be obtained to participate. Your consent will be active for this visit and any virtual visit you may have with one of our providers in the next 365 days. If you have a MyChart account, a copy of this consent can be sent to you electronically.  As this is a virtual visit, video technology does not allow for your provider to perform a traditional examination. This may limit your provider's ability to fully assess your condition. If your provider identifies any concerns that need to be evaluated in person or the need to arrange testing (such as labs, EKG, etc.), we will make arrangements to do so. Although advances in technology are sophisticated, we cannot ensure that it will always work on either your end or our end. If the connection with a video visit is poor, the visit may have to be switched to a telephone visit. With either a video or telephone visit, we are not always able to ensure that we have a secure connection.  By engaging in this virtual visit, you consent to the provision of healthcare and authorize for your insurance to be billed (if applicable) for the services provided during this visit. Depending on your insurance coverage, you may receive a charge related to this service.  I need to obtain your verbal consent now. Are you willing to proceed with your visit today? Katrina Liu has provided verbal consent on 04/16/2024 for a virtual visit (video or telephone). Katrina Liu, [place]  Date: 04/16/2024 10:23 AM   Virtual Visit via Video Note   I, Katrina Liu, connected with  Katrina Liu  ([id number], February 17, 1961) on 04/16/2024 at 10:15 AM EST by a video-enabled telemedicine application and verified that I am speaking with the correct person using two identifiers.  Location: Patient: Virtual Visit  Location Patient: Home Provider: Virtual Visit Location Provider: Home Office   I discussed the limitations of evaluation and management by telemedicine and the availability of in person appointments. The patient expressed understanding and agreed to proceed.    History of Present Illness: Katrina Liu is a 63 y.o. who identifies as a female who was assigned female at birth, and is being seen today for continued bilateral eye redness, irritation and drainage. Was evaluated a week ago by her Optometrist, thought was more of an allergic/inflammatory conjunctivitis. Was started on Fluorometholone drops which she has used as directed. Notes helps somewhat with irrigation but symptoms are not resolving. Now with matting and purulent drainage worse in the morning. Denies fever, chills. Denies vision changes. Does wear contact lenses but has not worn since seeing Optometrist.  HPI: HPI  Problems:  Patient Active Problem List   Diagnosis Date Noted   Seasonal allergies 10/01/2023   Osteopenia 10/01/2023   Herpes zoster vaccination declined 03/27/2023   COVID-19 vaccination declined 03/27/2023   Influenza vaccination declined 03/27/2023   Paroxysmal atrial fibrillation (HCC) 01/08/2022   Mixed hyperlipidemia 01/08/2022   Multinodular goiter 10/26/2020   Port-A-Cath in place 07/01/2018   Genetic testing 06/24/2018   Family history of prostate cancer    Malignant neoplasm of upper-outer quadrant of right breast in female, estrogen receptor negative (HCC) 06/02/2018   Recurrent oral ulcers 06/25/2017   Insomnia 06/03/2014   Migraine headache 12/25/2012   Cerebellar stroke syndrome 12/22/2012   Hypertension 12/22/2012  S/P abdominal hysterectomy 04/17/2011    Allergies: Allergies[1] Medications: Current Medications[2]  Observations/Objective: Patient is well-developed, well-nourished in no acute distress.  Resting comfortably at home.  Head is normocephalic, atraumatic.  No labored  breathing. Speech is clear and coherent with logical content.  Patient is alert and oriented at baseline.  Bilateral conjunctival injection with drainage noted. Pupils are equal and round.  Assessment and Plan: 1. Acute bacterial conjunctivitis of both eyes (Primary) - ofloxacin  (OCUFLOX ) 0.3 % ophthalmic solution; Place 1 drop into both eyes 4 (four) times daily.  Dispense: 5 mL; Refill: 0  Concern for secondary bacterial conjunctivitis. Supportive measures and OTC medications reviewed. Ok to continue her previous drops. Will add on ocuflox  per orders. She is to use new pair of contact lenses once this has resolved. In-person evaluation for any non-resolving, new or worsening symptoms despite treatment.  Follow Up Instructions: I discussed the assessment and treatment plan with the patient. The patient was provided an opportunity to ask questions and all were answered. The patient agreed with the plan and demonstrated an understanding of the instructions.  A copy of instructions were sent to the patient via MyChart unless otherwise noted below.   The patient was advised to call back or seek an in-person evaluation if the symptoms worsen or if the condition fails to improve as anticipated.    Katrina Velma Lunger, PA-C    [1]  Allergies Allergen Reactions   Lisinopril Swelling and Other (See Comments)  [2]  Current Outpatient Medications:    ofloxacin  (OCUFLOX ) 0.3 % ophthalmic solution, Place 1 drop into both eyes 4 (four) times daily., Disp: 5 mL, Rfl: 0   aspirin  EC 81 MG EC tablet, Take 1 tablet (81 mg total) by mouth daily., Disp: 90 tablet, Rfl: 0   calcium  carbonate (OSCAL) 1500 (600 Ca) MG TABS tablet, Take by mouth 2 (two) times daily with a meal., Disp: , Rfl:    chlorthalidone  (HYGROTON ) 25 MG tablet, TAKE 1 TABLET(25 MG) BY MOUTH DAILY, Disp: 90 tablet, Rfl: 1   Cholecalciferol (VITAMIN D3) 50 MCG (2000 UT) TABS, Take by mouth., Disp: , Rfl:    Cyanocobalamin  (VITAMIN B-12  CR PO), Take by mouth. , Disp: , Rfl:    labetalol  (NORMODYNE ) 100 MG tablet, TAKE 1 TABLET BY MOUTH TWICE DAILY, Disp: 180 tablet, Rfl: 1   loratadine  (CLARITIN ) 10 MG tablet, Take 1 tablet (10 mg total) by mouth daily., Disp: 30 tablet, Rfl: 2   Multiple Vitamin (MULTIVITAMIN ADULT PO), Take by mouth., Disp: , Rfl:    vitamin E 180 MG (400 UNITS) capsule, Take 400 Units by mouth daily., Disp: , Rfl:

## 2024-05-03 ENCOUNTER — Encounter: Payer: Self-pay | Admitting: Nurse Practitioner

## 2024-05-04 ENCOUNTER — Other Ambulatory Visit: Payer: Self-pay

## 2024-05-04 DIAGNOSIS — I10 Essential (primary) hypertension: Secondary | ICD-10-CM

## 2024-05-04 MED ORDER — CHLORTHALIDONE 25 MG PO TABS: 25.0000 mg | ORAL_TABLET | Freq: Every day | ORAL | 1 refills | Status: AC

## 2024-06-15 ENCOUNTER — Ambulatory Visit: Admitting: Dermatology

## 2024-07-29 ENCOUNTER — Ambulatory Visit: Admitting: Nurse Practitioner

## 2024-08-03 ENCOUNTER — Ambulatory Visit: Payer: Self-pay | Admitting: Nurse Practitioner

## 2024-11-10 ENCOUNTER — Encounter: Admitting: Adult Health

## 2024-11-10 ENCOUNTER — Other Ambulatory Visit
